# Patient Record
Sex: Female | Born: 1960
Health system: Southern US, Community
[De-identification: ages and names within clinical notes are randomized; demographics above are authoritative.]

## PROBLEM LIST (undated history)

## (undated) DIAGNOSIS — M1990 Unspecified osteoarthritis, unspecified site: Secondary | ICD-10-CM

## (undated) DIAGNOSIS — T7840XA Allergy, unspecified, initial encounter: Secondary | ICD-10-CM

## (undated) DIAGNOSIS — R7303 Prediabetes: Secondary | ICD-10-CM

## (undated) DIAGNOSIS — K449 Diaphragmatic hernia without obstruction or gangrene: Secondary | ICD-10-CM

## (undated) DIAGNOSIS — K219 Gastro-esophageal reflux disease without esophagitis: Secondary | ICD-10-CM

## (undated) DIAGNOSIS — Z889 Allergy status to unspecified drugs, medicaments and biological substances status: Secondary | ICD-10-CM

## (undated) DIAGNOSIS — I10 Essential (primary) hypertension: Secondary | ICD-10-CM

## (undated) HISTORY — DX: Diaphragmatic hernia without obstruction or gangrene: K44.9

## (undated) HISTORY — DX: Prediabetes: R73.03

## (undated) HISTORY — DX: Allergy, unspecified, initial encounter: T78.40XA

## (undated) HISTORY — DX: Unspecified osteoarthritis, unspecified site: M19.90

## (undated) HISTORY — PX: CHOLECYSTECTOMY: SHX55

## (undated) HISTORY — PX: TUBAL LIGATION: SHX77

## (undated) HISTORY — PX: ANKLE SURGERY: SHX546

## (undated) HISTORY — PX: TONSILLECTOMY: SUR1361

---

## 1999-03-30 ENCOUNTER — Emergency Department (HOSPITAL_COMMUNITY): Admission: EM | Admit: 1999-03-30 | Discharge: 1999-03-30 | Payer: Self-pay

## 2001-02-20 ENCOUNTER — Encounter (INDEPENDENT_AMBULATORY_CARE_PROVIDER_SITE_OTHER): Payer: Self-pay | Admitting: Specialist

## 2001-02-20 ENCOUNTER — Ambulatory Visit (HOSPITAL_COMMUNITY): Admission: RE | Admit: 2001-02-20 | Discharge: 2001-02-20 | Payer: Self-pay | Admitting: Obstetrics & Gynecology

## 2002-02-19 ENCOUNTER — Other Ambulatory Visit: Admission: RE | Admit: 2002-02-19 | Discharge: 2002-02-19 | Payer: Self-pay | Admitting: Obstetrics & Gynecology

## 2002-03-14 ENCOUNTER — Emergency Department (HOSPITAL_COMMUNITY): Admission: EM | Admit: 2002-03-14 | Discharge: 2002-03-14 | Payer: Self-pay

## 2002-03-14 ENCOUNTER — Encounter: Payer: Self-pay | Admitting: Emergency Medicine

## 2002-09-10 ENCOUNTER — Encounter: Payer: Self-pay | Admitting: Internal Medicine

## 2002-09-10 ENCOUNTER — Ambulatory Visit (HOSPITAL_COMMUNITY): Admission: RE | Admit: 2002-09-10 | Discharge: 2002-09-10 | Payer: Self-pay | Admitting: Internal Medicine

## 2002-09-17 ENCOUNTER — Encounter: Payer: Self-pay | Admitting: Internal Medicine

## 2002-09-17 ENCOUNTER — Ambulatory Visit (HOSPITAL_COMMUNITY): Admission: RE | Admit: 2002-09-17 | Discharge: 2002-09-17 | Payer: Self-pay | Admitting: Internal Medicine

## 2003-05-20 ENCOUNTER — Other Ambulatory Visit: Admission: RE | Admit: 2003-05-20 | Discharge: 2003-05-20 | Payer: Self-pay | Admitting: Obstetrics and Gynecology

## 2004-11-11 ENCOUNTER — Encounter: Admission: RE | Admit: 2004-11-11 | Discharge: 2004-11-11 | Payer: Self-pay | Admitting: Orthopedic Surgery

## 2011-02-01 NOTE — Op Note (Signed)
[facility]  Patient:    Felicia Chase, Felicia Chase                  MRN: [id number] Proc. Date: 02/20/01 Adm. Date:  [id number] Attending:  Genia Del                           Operative Report  DATE OF BIRTH:                May 25, 1961.  PREOPERATIVE DIAGNOSIS:       An 8+ weeks gestation, undesired.  POSTOPERATIVE DIAGNOSIS:      An 8+ weeks gestation, undesired.  OPERATION:                    Dilatation and evacuation.  SURGEON:                      Genia Del, M.D.  ANESTHESIOLOGIST:             Ellison Hughs., M.D.  ANESTHESIA:                   MAC.  ESTIMATED BLOOD LOSS:         Less than 50 cc.  DESCRIPTION OF PROCEDURE:     Under MAC analgesia, the patient is in lithotomy position. She is prepped with Betadine in the suprapubic, vulva, and vaginal areas. Catheterization of the bladder is achieved. We then drape the patient as usual. The vaginal exam revealed a retroverted uterus corresponding to 8 to [redacted] weeks gestation, no adnexal mass. The cervix is closed, no vaginal bleeding. The speculum is introduced. The paracervical block is done with lidocaine 1% 20 cc total at 4 and 8 oclock. We then grasp the anterior lip of the cervix with the tenaculum and proceed with dilatation with Hegar dilators up to #35 easily. We then introduced a #9 curved suction curet and the intrauterine cavity is suctioned systematically. Products of conception correspond to about 8 to [redacted] weeks gestation and they are sent to pathology. We then complete the curettage with a sharp curet, curetting all surfaces of the intrauterine cavity. The intrauterine sound is heard throughout. We then finish by suctioning the intrauterine cavity with a #9 curet. The uterus contracts well. All instruments are removed. Hemostasis was adequate. Estimated blood loss was less than 50 cc. The vaginal exam after the intervention was normal with a well contracted  uterus. Note that the patient was coughing during the end of the surgery. This problem was managed by anesthesiology. The patient remained stable and well saturated throughout. No complication. The patient was transferred to recovery room in good status. Pending blood group; RhoGAM will be given if Rh is negative.DD:  02/20/01 TD:  02/20/01 Job: [id number]

## 2011-10-29 ENCOUNTER — Encounter (HOSPITAL_BASED_OUTPATIENT_CLINIC_OR_DEPARTMENT_OTHER): Payer: Self-pay | Admitting: *Deleted

## 2011-10-29 ENCOUNTER — Emergency Department (INDEPENDENT_AMBULATORY_CARE_PROVIDER_SITE_OTHER): Payer: BC Managed Care – PPO

## 2011-10-29 ENCOUNTER — Emergency Department (HOSPITAL_BASED_OUTPATIENT_CLINIC_OR_DEPARTMENT_OTHER)
Admission: EM | Admit: 2011-10-29 | Discharge: 2011-10-29 | Disposition: A | Discharge status: Home / Self Care | Payer: BC Managed Care – PPO | Attending: Emergency Medicine | Admitting: Emergency Medicine

## 2011-10-29 ENCOUNTER — Other Ambulatory Visit: Payer: Self-pay

## 2011-10-29 DIAGNOSIS — R0602 Shortness of breath: Secondary | ICD-10-CM

## 2011-10-29 DIAGNOSIS — M79609 Pain in unspecified limb: Secondary | ICD-10-CM

## 2011-10-29 DIAGNOSIS — R079 Chest pain, unspecified: Secondary | ICD-10-CM

## 2011-10-29 DIAGNOSIS — K219 Gastro-esophageal reflux disease without esophagitis: Secondary | ICD-10-CM | POA: Insufficient documentation

## 2011-10-29 DIAGNOSIS — R209 Unspecified disturbances of skin sensation: Secondary | ICD-10-CM

## 2011-10-29 HISTORY — DX: Gastro-esophageal reflux disease without esophagitis: K21.9

## 2011-10-29 LAB — CBC
HCT: 32.7 % — ABNORMAL LOW (ref 36.0–46.0)
Hemoglobin: 10.9 g/dL — ABNORMAL LOW (ref 12.0–15.0)
MCH: 26.7 pg (ref 26.0–34.0)
MCHC: 33.3 g/dL (ref 30.0–36.0)
MCV: 80 fL (ref 78.0–100.0)
Platelets: 224 10*3/uL (ref 150–400)
RBC: 4.09 MIL/uL (ref 3.87–5.11)
RDW: 14.8 % (ref 11.5–15.5)
WBC: 9.9 10*3/uL (ref 4.0–10.5)

## 2011-10-29 LAB — COMPREHENSIVE METABOLIC PANEL
ALT: 11 U/L (ref 0–35)
AST: 11 U/L (ref 0–37)
Albumin: 4 g/dL (ref 3.5–5.2)
Alkaline Phosphatase: 77 U/L (ref 39–117)
BUN: 21 mg/dL (ref 6–23)
CO2: 25 mEq/L (ref 19–32)
Calcium: 9.6 mg/dL (ref 8.4–10.5)
Chloride: 105 mEq/L (ref 96–112)
Creatinine, Ser: 0.9 mg/dL (ref 0.50–1.10)
GFR calc Af Amer: 85 mL/min — ABNORMAL LOW (ref >90)
GFR calc non Af Amer: 73 mL/min — ABNORMAL LOW (ref >90)
Glucose, Bld: 95 mg/dL (ref 70–99)
Potassium: 3.5 mEq/L (ref 3.5–5.1)
Sodium: 140 mEq/L (ref 135–145)
Total Bilirubin: 0.2 mg/dL — ABNORMAL LOW (ref 0.3–1.2)
Total Protein: 7.3 g/dL (ref 6.0–8.3)

## 2011-10-29 LAB — CARDIAC PANEL(CRET KIN+CKTOT+MB+TROPI)
CK, MB: 2.2 ng/mL (ref 0.3–4.0)
Relative Index: INVALID (ref 0.0–2.5)
Total CK: 94 U/L (ref 7–177)
Troponin I: <0.3 ng/mL (ref <0.30)

## 2011-10-29 MED ORDER — OXYCODONE-ACETAMINOPHEN 5-325 MG PO TABS: 1.0000 | ORAL_TABLET | Freq: Four times a day (QID) | ORAL | Status: AC | PRN

## 2011-10-29 MED ORDER — IBUPROFEN 800 MG PO TABS
800.0000 mg | ORAL_TABLET | Freq: Once | ORAL | Status: AC
  Administered 2011-10-29: 800 mg via ORAL
  Filled 2011-10-29: qty 1

## 2011-10-29 MED ORDER — ALBUTEROL SULFATE HFA 108 (90 BASE) MCG/ACT IN AERS
2.0000 | INHALATION_SPRAY | RESPIRATORY_TRACT | Status: DC
  Administered 2011-10-29: 2 via RESPIRATORY_TRACT
  Filled 2011-10-29: qty 6.7

## 2011-10-29 MED ORDER — IBUPROFEN 600 MG PO TABS: 600.0000 mg | ORAL_TABLET | Freq: Three times a day (TID) | ORAL | Status: AC | PRN

## 2011-10-29 NOTE — ED Provider Notes (Signed)
History     CSN: [id number]  Arrival date & time 10/29/11  2126   First MD Initiated Contact with Patient 10/29/11 2300      Chief Complaint  Patient presents with  . Chest Pain    (Consider location/radiation/quality/duration/timing/severity/associated sxs/prior treatment) The history is provided by the patient.   the patient reports 4 days of constant left-sided chest and trapezius pain.  She reports tingling in her left upper extremity.  She denies neck pain.  She's had no recent fall or trauma to her head her neck.  She's had no recent trauma to her chest.  She reports the pain in the left chest is constant.  It is worsened by movement of her left arm and by palpation of her left chest.  She has not noted a rash.  She denies fevers and chills.  She denies diaphoresis.  She denies shortness of breath or cough.  She denies unilateral leg swelling.  She denies weakness of her upper extremities.  Nothing worsens her symptoms.  Nothing improves her symptoms.  She has tried some anti-inflammatories without relief.  She reports more difficulty sleeping at night secondary to the pain.  She has no early family history of heart disease.  She was recently started on meloxicam for chronic lower extreme pain and reports that she stop this and she believes this might be causing her symptoms.  Past Medical History  Diagnosis Date  . GERD (gastroesophageal reflux disease)     Past Surgical History  Procedure Date  . Cholecystectomy   . Tonsillectomy   . Tubal ligation     History reviewed. No pertinent family history.  History  Substance Use Topics  . Smoking status: Never Smoker   . Smokeless tobacco: Not on file  . Alcohol Use: No    OB History    Grav Para Term Preterm Abortions TAB SAB Ect Mult Living                  Review of Systems  All other systems reviewed and are negative.    Allergies  Penicillins; Cefdinir; and Vicodin  Home Medications   Current Outpatient Rx   Name Route Sig Dispense Refill  . VITAMIN D 1000 UNITS PO TABS Oral Take 1,000 Units by mouth daily.    . MELOXICAM 15 MG PO TABS Oral Take 15 mg by mouth daily.    Marland Kitchen OMEPRAZOLE 20 MG PO CPDR Oral Take 40 mg by mouth daily.    Marland Kitchen VITAMIN E 400 UNITS PO CAPS Oral Take 400 Units by mouth daily.      BP 165/93  Pulse 86  Temp(Src) 98.5 F (36.9 C) (Oral)  Resp 18  Ht 5\' 7"  (1.702 m)  Wt 218 lb (98.884 kg)  BMI 34.14 kg/m2  SpO2 100%  Physical Exam  Nursing note and vitals reviewed. Constitutional: She is oriented to person, place, and time. She appears well-developed and well-nourished. No distress.  HENT:  Head: Normocephalic and atraumatic.  Eyes: EOM are normal.  Neck: Normal range of motion.  Cardiovascular: Normal rate, regular rhythm and normal heart sounds.   Pulmonary/Chest: Effort normal and breath sounds normal.       Tenderness of her left chest wall and trapezius.  No notable rash noted  Abdominal: Soft. She exhibits no distension. There is no tenderness.  Musculoskeletal: Normal range of motion.       Normal strength in her bilateral upper extremities.  Normal radial pulses bilaterally.  No obvious  swelling of her left upper extremity.  She does have pain that is caused in her left chest with movement of her left upper extremity  Neurological: She is alert and oriented to person, place, and time.  Skin: Skin is warm and dry.  Psychiatric: She has a normal mood and affect. Judgment normal.    ED Course  Procedures (including critical care time)   Date: 10/29/2011  Rate: 86  Rhythm: normal sinus rhythm  QRS Axis: normal  Intervals: normal  ST/T Wave abnormalities: normal  Conduction Disutrbances: none  Narrative Interpretation:   Old EKG Reviewed: No prior ecg    Labs Reviewed  CBC - Abnormal; Notable for the following:    Hemoglobin 10.9 (*)    HCT 32.7 (*)    All other components within normal limits  COMPREHENSIVE METABOLIC PANEL - Abnormal; Notable  for the following:    Total Bilirubin 0.2 (*)    GFR calc non Af Amer 73 (*)    GFR calc Af Amer 85 (*)    All other components within normal limits  CARDIAC PANEL(CRET KIN+CKTOT+MB+TROPI)   Dg Chest 2 View  10/29/2011  *RADIOLOGY REPORT*  Clinical Data: Left-sided chest pain radiating to the left upper extremity.  Numbness and tingling.  Shortness of breath.  CHEST - 2 VIEW  Comparison: None.  Findings: Normal heart size and pulmonary vascularity.  No focal airspace consolidation in the lungs.  No blunting of costophrenic angles.  No pneumothorax.  Surgical clips in the right upper quadrant.  Mild degenerative changes in the spine.  IMPRESSION: No evidence of active pulmonary disease.  Original Report Authenticated By: Marlon Pel, M.D.   I personally reviewed the x-ray  1. Chest pain       MDM  Patient's symptoms may represent cervical radiculopathy.  Her pain has been constant.  Her EKG and chest x-ray and troponin are normal.  She's had 4 days of constant pain.  She has normal strength.  Symptomatically min.  PCP followup if her symptoms continue she may require an MRI of her cervical spine.  Doubt ACS.  Labs otherwise normal.        Lyanne Co, MD 10/30/11 [phone]

## 2011-10-29 NOTE — ED Notes (Signed)
Pt returned from XR. Monitor reapplied.

## 2011-10-29 NOTE — ED Notes (Signed)
Pt being transported to XR at this time.  

## 2011-10-29 NOTE — ED Notes (Signed)
Pt c/o Cp pressure x 4 days ago, recently started on meloxicam on Friday.

## 2011-10-29 NOTE — ED Notes (Signed)
Pt reports chest pressure that radiates into the left arm x4 days. Intermittent. Reports mild cough. Denies fevers. Pt thought the meloxicam may have been causing the chest pains, so she stopped taking it Sunday, and has not taken it since. CP continue.

## 2013-10-10 ENCOUNTER — Emergency Department (HOSPITAL_COMMUNITY)
Admission: EM | Admit: 2013-10-10 | Discharge: 2013-10-10 | Disposition: A | Discharge status: Home / Self Care | Payer: BC Managed Care – PPO | Attending: Emergency Medicine | Admitting: Emergency Medicine

## 2013-10-10 ENCOUNTER — Encounter (HOSPITAL_COMMUNITY): Payer: Self-pay | Admitting: Emergency Medicine

## 2013-10-10 ENCOUNTER — Emergency Department (HOSPITAL_COMMUNITY): Payer: BC Managed Care – PPO

## 2013-10-10 DIAGNOSIS — Z791 Long term (current) use of non-steroidal anti-inflammatories (NSAID): Secondary | ICD-10-CM | POA: Insufficient documentation

## 2013-10-10 DIAGNOSIS — K219 Gastro-esophageal reflux disease without esophagitis: Secondary | ICD-10-CM | POA: Insufficient documentation

## 2013-10-10 DIAGNOSIS — Z880 Allergy status to penicillin: Secondary | ICD-10-CM | POA: Insufficient documentation

## 2013-10-10 DIAGNOSIS — Z79899 Other long term (current) drug therapy: Secondary | ICD-10-CM | POA: Insufficient documentation

## 2013-10-10 DIAGNOSIS — R209 Unspecified disturbances of skin sensation: Secondary | ICD-10-CM | POA: Insufficient documentation

## 2013-10-10 DIAGNOSIS — M25519 Pain in unspecified shoulder: Secondary | ICD-10-CM | POA: Insufficient documentation

## 2013-10-10 DIAGNOSIS — M62838 Other muscle spasm: Secondary | ICD-10-CM

## 2013-10-10 DIAGNOSIS — IMO0002 Reserved for concepts with insufficient information to code with codable children: Secondary | ICD-10-CM | POA: Insufficient documentation

## 2013-10-10 DIAGNOSIS — M5380 Other specified dorsopathies, site unspecified: Secondary | ICD-10-CM | POA: Insufficient documentation

## 2013-10-10 LAB — BASIC METABOLIC PANEL
BUN: 10 mg/dL (ref 6–23)
CO2: 24 mEq/L (ref 19–32)
Calcium: 8.9 mg/dL (ref 8.4–10.5)
Chloride: 103 mEq/L (ref 96–112)
Creatinine, Ser: 0.77 mg/dL (ref 0.50–1.10)
GFR calc Af Amer: >90 mL/min (ref >90)
GFR calc non Af Amer: >90 mL/min (ref >90)
Glucose, Bld: 111 mg/dL — ABNORMAL HIGH (ref 70–99)
Potassium: 3.6 mEq/L — ABNORMAL LOW (ref 3.7–5.3)
Sodium: 142 mEq/L (ref 137–147)

## 2013-10-10 LAB — CBC
HCT: 34.9 % — ABNORMAL LOW (ref 36.0–46.0)
Hemoglobin: 11.4 g/dL — ABNORMAL LOW (ref 12.0–15.0)
MCH: 27.2 pg (ref 26.0–34.0)
MCHC: 32.7 g/dL (ref 30.0–36.0)
MCV: 83.3 fL (ref 78.0–100.0)
Platelets: 247 10*3/uL (ref 150–400)
RBC: 4.19 MIL/uL (ref 3.87–5.11)
RDW: 14.6 % (ref 11.5–15.5)
WBC: 7.5 10*3/uL (ref 4.0–10.5)

## 2013-10-10 LAB — POCT I-STAT TROPONIN I: Troponin i, poc: 0 ng/mL (ref 0.00–0.08)

## 2013-10-10 MED ORDER — DIAZEPAM 5 MG PO TABS
10.0000 mg | ORAL_TABLET | Freq: Once | ORAL | Status: AC
  Administered 2013-10-10: 10 mg via ORAL
  Filled 2013-10-10: qty 2

## 2013-10-10 MED ORDER — DIAZEPAM 5 MG PO TABS: 5.0000 mg | ORAL_TABLET | Freq: Two times a day (BID) | ORAL | Status: DC | PRN

## 2013-10-10 NOTE — ED Provider Notes (Signed)
CSN: [id number]     Arrival date & time 10/10/13  1158 History   First MD Initiated Contact with Patient 10/10/13 1213     Chief Complaint  Patient presents with  . Chest Pain  . Shoulder Pain   (Consider location/radiation/quality/duration/timing/severity/associated sxs/prior Treatment) HPI Comments: History of L arm numbness, muscle spasms. Has had MRI, was told she had C5-C6 DDD causing her L arm problems. Patient was doing PT for this up until 2 months ago.   Patient is a 53 y.o. female presenting with chest pain and shoulder pain.  Chest Pain Pain location:  L lateral chest Pain quality: tightness (like muscle spasm)   Pain radiates to:  Does not radiate Pain radiates to the back: no   Pain severity:  Mild Onset quality:  Gradual Duration:  4 days Timing:  Constant Progression:  Unchanged Chronicity:  New Context: no drug use   Relieved by:  Nothing Worsened by:  Nothing tried Associated symptoms: no cough, no fever and no shortness of breath   Shoulder Pain Pertinent negatives include no chest pain and no shortness of breath.    Past Medical History  Diagnosis Date  . GERD (gastroesophageal reflux disease)    Past Surgical History  Procedure Laterality Date  . Cholecystectomy    . Tonsillectomy    . Tubal ligation     History reviewed. No pertinent family history. History  Substance Use Topics  . Smoking status: Never Smoker   . Smokeless tobacco: Not on file  . Alcohol Use: No   OB History   Grav Para Term Preterm Abortions TAB SAB Ect Mult Living                 Review of Systems  Constitutional: Negative for fever and chills.  Respiratory: Negative for cough and shortness of breath.   Cardiovascular: Negative for chest pain and leg swelling.  All other systems reviewed and are negative.    Allergies  Penicillins; Cefdinir; and Vicodin  Home Medications   Current Outpatient Rx  Name  Route  Sig  Dispense  Refill  . cholecalciferol (VITAMIN  D) 1000 UNITS tablet   Oral   Take 1,000 Units by mouth daily.         . meloxicam (MOBIC) 15 MG tablet   Oral   Take 15 mg by mouth daily.         Marland Kitchen omeprazole (PRILOSEC) 20 MG capsule   Oral   Take 40 mg by mouth daily.         . vitamin E 400 UNIT capsule   Oral   Take 400 Units by mouth daily.          BP 140/94  Pulse 87  Temp(Src) 98.4 F (36.9 C) (Oral)  Resp 18  Ht 5\' 7"  (1.702 m)  Wt 226 lb 14.4 oz (102.921 kg)  BMI 35.53 kg/m2  SpO2 98% Physical Exam  Nursing note and vitals reviewed. Constitutional: She is oriented to person, place, and time. She appears well-developed and well-nourished. No distress.  HENT:  Head: Normocephalic and atraumatic.  Eyes: EOM are normal. Pupils are equal, round, and reactive to light.  Neck: Normal range of motion. Neck supple.  Cardiovascular: Normal rate and regular rhythm.  Exam reveals no friction rub.   No murmur heard. Pulmonary/Chest: Effort normal. No respiratory distress. She has no decreased breath sounds. She has no wheezes. She has no rales. She exhibits tenderness (left upper chest).  Abdominal: Soft. She  exhibits no distension. There is no tenderness. There is no rebound.  Musculoskeletal: Normal range of motion. She exhibits no edema.       Cervical back: She exhibits tenderness (left trapezius) and spasm (L trapezius, severe).  Neurological: She is alert and oriented to person, place, and time.  Skin: She is not diaphoretic.    ED Course  Procedures (including critical care time) Labs Review Labs Reviewed  CBC - Abnormal; Notable for the following:    Hemoglobin 11.4 (*)    HCT 34.9 (*)    All other components within normal limits  BASIC METABOLIC PANEL - Abnormal; Notable for the following:    Potassium 3.6 (*)    Glucose, Bld 111 (*)    All other components within normal limits  POCT I-STAT TROPONIN I   Imaging Review Dg Chest Portable 1 View  10/10/2013   CLINICAL DATA:  Chest and shoulder  pain.  EXAM: PORTABLE CHEST - 1 VIEW  COMPARISON:  10/29/2011  FINDINGS: Lungs are clear. Visualized stable cardiomegaly. Remaining bones soft tissues are within normal.  IMPRESSION: No active disease.   Electronically Signed   By: Marin Olp M.D.   On: 10/10/2013 14:20    EKG Interpretation    Date/Time:  Sunday October 10 2013 12:02:41 EST Ventricular Rate:  101 PR Interval:  152 QRS Duration: 74 QT Interval:  366 QTC Calculation: 474 R Axis:   52 Text Interpretation:  Sinus tachycardia Otherwise normal ECG Similar to prior Confirmed by Mingo Amber  MD, [place] ([id number]) on 10/10/2013 12:46:00 PM            MDM   1. Muscle spasm    53 year old female presents with left-sided chest tightness and left arm numbness. Left arm numbness has been going on for over a year. She was told she had a degenerative disc disease has been going to physical therapy for her neck to help with this. She hasn't been to physical therapy in a couple months. She stated now she's having some chest tightness and occasional fluttering. She can't determine if the fluttering is a palpitations or like muscle twitching. She denies any chest pain, N/V, SOB. Here AFVSS. Lungs clear. Upper L chest soreness on palpation. L trapezius with severe muscle spasm. Left trapezius muscle spasms likely the source of her arm numbness and chest tightness. We'll check a troponin to ensure no concern for cardiac disease because check EKG and chest x-ray, however will also give Valium for muscle spasm. Single troponin negative. CXR clear. Feeling better after valium. Discharged with valium Rx.     Osvaldo Shipper, MD 10/10/13 [phone]

## 2013-10-10 NOTE — ED Notes (Signed)
PA at bedside.

## 2013-10-10 NOTE — Discharge Instructions (Signed)
°  Muscle Cramps and Spasms °Muscle cramps and spasms occur when a muscle or muscles tighten and you have no control over this tightening (involuntary muscle contraction). They are a common problem and can develop in any muscle. The most common place is in the calf muscles of the leg. Both muscle cramps and muscle spasms are involuntary muscle contractions, but they also have differences:  °· Muscle cramps are sporadic and painful. They may last a few seconds to a quarter of an hour. Muscle cramps are often more forceful and last longer than muscle spasms. °· Muscle spasms may or may not be painful. They may also last just a few seconds or much longer. °CAUSES  °It is uncommon for cramps or spasms to be due to a serious underlying problem. In many cases, the cause of cramps or spasms is unknown. Some common causes are:  °· Overexertion.   °· Overuse from repetitive motions (doing the same thing over and over).   °· Remaining in a certain position for a long period of time.   °· Improper preparation, form, or technique while performing a sport or activity.   °· Dehydration.   °· Injury.   °· Side effects of some medicines.   °· Abnormally low levels of the salts and ions in your blood (electrolytes), especially potassium and calcium. This could happen if you are taking water pills (diuretics) or you are pregnant.   °Some underlying medical problems can make it more likely to develop cramps or spasms. These include, but are not limited to:  °· Diabetes.   °· Parkinson disease.   °· Hormone disorders, such as thyroid problems.   °· Alcohol abuse.   °· Diseases specific to muscles, joints, and bones.   °· Blood vessel disease where not enough blood is getting to the muscles.   °HOME CARE INSTRUCTIONS  °· Stay well hydrated. Drink enough water and fluids to keep your urine clear or pale yellow. °· It may be helpful to massage, stretch, and relax the affected muscle. °· For tight or tense muscles, use a warm towel, heating  pad, or hot shower water directed to the affected area. °· If you are sore or have pain after a cramp or spasm, applying ice to the affected area may relieve discomfort. °· Put ice in a plastic bag. °· Place a towel between your skin and the bag. °· Leave the ice on for 15-20 minutes, 03-04 times a day. °· Medicines used to treat a known cause of cramps or spasms may help reduce their frequency or severity. Only take over-the-counter or prescription medicines as directed by your caregiver. °SEEK MEDICAL CARE IF:  °Your cramps or spasms get more severe, more frequent, or do not improve over time.  °MAKE SURE YOU:  °· Understand these instructions. °· Will watch your condition. °· Will get help right away if you are not doing well or get worse. °Document Released: 02/22/2002 Document Revised: 12/28/2012 Document Reviewed: 08/19/2012 °[employer]® Patient Information ©2014 [employer]. ° ° °

## 2013-10-10 NOTE — ED Notes (Signed)
Pt here from home with c/o left shoulder pain that radiates to her chest , pt does have a c5 and c6 problem that has affected her shoulder for about a year

## 2015-12-13 ENCOUNTER — Encounter: Payer: Self-pay | Admitting: Obstetrics & Gynecology

## 2015-12-13 ENCOUNTER — Ambulatory Visit (INDEPENDENT_AMBULATORY_CARE_PROVIDER_SITE_OTHER): Payer: Self-pay | Admitting: Obstetrics & Gynecology

## 2015-12-13 VITALS — BP 155/88 | HR 82 | Temp 98.1°F | Ht 67.0 in | Wt 225.4 lb

## 2015-12-13 DIAGNOSIS — N898 Other specified noninflammatory disorders of vagina: Secondary | ICD-10-CM

## 2015-12-13 DIAGNOSIS — Z113 Encounter for screening for infections with a predominantly sexual mode of transmission: Secondary | ICD-10-CM

## 2015-12-13 NOTE — Progress Notes (Signed)
   CLINIC ENCOUNTER NOTE  History:  55 y.o. F here today for white vaginal discharge and irritation for 2 months.   She denies any abnormal vaginal bleeding, pelvic pain or other concerns.   Past Medical History  Diagnosis Date  . GERD (gastroesophageal reflux disease)     Past Surgical History  Procedure Laterality Date  . Cholecystectomy    . Tonsillectomy    . Tubal ligation      The following portions of the patient's history were reviewed and updated as appropriate: allergies, current medications, past family history, past medical history, past social history, past surgical history and problem list.   Health Maintenance:  Normal pap and negative HRHPV on 04/2014.  Normal mammogram on 12/2014. Marland Kitchen   Review of Systems:  Pertinent items noted in HPI and remainder of comprehensive ROS otherwise negative.  Objective:  Physical Exam BP 155/88 mmHg  Pulse 82  Temp(Src) 98.1 F (36.7 C) (Oral)  Ht 5\' 7"  (1.702 m)  Wt 225 lb 6.2 oz (102.236 kg)  BMI 35.29 kg/m2 CONSTITUTIONAL: Well-developed, well-nourished female in no acute distress.  HENT:  Normocephalic, atraumatic. External right and left ear normal. Oropharynx is clear and moist EYES: Conjunctivae and EOM are normal. Pupils are equal, round, and reactive to light. No scleral icterus.  NECK: Normal range of motion, supple, no masses SKIN: Skin is warm and dry. No rash noted. Not diaphoretic. No erythema. No pallor. NEUROLOGIC: Alert and oriented to person, place, and time. Normal reflexes, muscle tone coordination. No cranial nerve deficit noted. PSYCHIATRIC: Normal mood and affect. Normal behavior. Normal judgment and thought content. CARDIOVASCULAR: Normal heart rate noted RESPIRATORY: Effort and breath sounds normal, no problems with respiration noted ABDOMEN: Soft, no distention noted.   PELVIC: Normal appearing external genitalia; normal appearing vaginal mucosa and cervix.  Scant white discharge noted, wet prep  obtained.  Normal uterine size, no other palpable masses, no uterine or adnexal tenderness. MUSCULOSKELETAL: Normal range of motion. No edema noted.   Assessment & Plan:   Vaginal discharge Cervicovaginal ancillary done, will follow up results and manage accordingly. Proper vulvar hygiene emphasized: discussed avoidance of perfumed soaps, detergents, lotions and any type of douches; in addition to wearing cotton underwear and no underwear at night.  Also recommended cleaning front to back, voiding and cleaning up after intercourse.    Routine preventative health maintenance measures emphasized. Please refer to After Visit Summary for other counseling recommendations.    Total face-to-face time with patient: 20 minutes. Over 50% of encounter was spent on counseling and coordination of care.   Verita Schneiders, MD, [place] Attending Obstetrician & Gynecologist, [place] for [facility]

## 2015-12-14 LAB — CERVICOVAGINAL ANCILLARY ONLY: Wet Prep (BD Affirm): NEGATIVE

## 2015-12-15 ENCOUNTER — Telehealth: Payer: Self-pay | Admitting: Obstetrics & Gynecology

## 2015-12-15 NOTE — Telephone Encounter (Signed)
Patient calling to get test results.

## 2015-12-18 ENCOUNTER — Telehealth: Payer: Self-pay | Admitting: General Practice

## 2015-12-18 NOTE — Telephone Encounter (Signed)
Per Dr Harolyn Rutherford, patient's wet prep was negative. Called patient and informed her of results. Patient verbalized understanding & had no questions.

## 2015-12-18 NOTE — Telephone Encounter (Signed)
Opened in error

## 2015-12-20 ENCOUNTER — Other Ambulatory Visit: Payer: Self-pay | Admitting: Nurse Practitioner

## 2015-12-20 DIAGNOSIS — Z1231 Encounter for screening mammogram for malignant neoplasm of breast: Secondary | ICD-10-CM

## 2016-01-10 ENCOUNTER — Ambulatory Visit
Admission: RE | Admit: 2016-01-10 | Discharge: 2016-01-10 | Disposition: A | Discharge status: Home / Self Care | Payer: No Typology Code available for payment source | Source: Ambulatory Visit | Attending: Nurse Practitioner | Admitting: Nurse Practitioner

## 2016-01-10 DIAGNOSIS — Z1231 Encounter for screening mammogram for malignant neoplasm of breast: Secondary | ICD-10-CM

## 2016-08-14 ENCOUNTER — Ambulatory Visit (INDEPENDENT_AMBULATORY_CARE_PROVIDER_SITE_OTHER): Payer: Self-pay | Admitting: *Deleted

## 2016-08-14 DIAGNOSIS — N898 Other specified noninflammatory disorders of vagina: Secondary | ICD-10-CM

## 2016-08-14 NOTE — Progress Notes (Signed)
Pt reports having vaginal discharge and itch.  Self wet prep obtained.  Pt will be called with results tomorrow.  Pt voiced understanding.

## 2016-08-15 ENCOUNTER — Other Ambulatory Visit: Payer: Self-pay

## 2016-08-15 LAB — WET PREP, GENITAL
Trich, Wet Prep: NONE SEEN
Yeast Wet Prep HPF POC: NONE SEEN

## 2016-08-15 MED ORDER — METRONIDAZOLE 500 MG PO TABS: 500.0000 mg | ORAL_TABLET | Freq: Two times a day (BID) | ORAL | 0 refills | Status: DC

## 2016-08-15 NOTE — Telephone Encounter (Signed)
Patient has been informed of her BV and need for Flagyl. Patient also requested to something be called into the pharmacy. Per Dr.Pratt patient should start flagyl and give in a couple of days.

## 2016-08-21 ENCOUNTER — Telehealth: Payer: Self-pay

## 2016-08-21 MED ORDER — FLUCONAZOLE 150 MG PO TABS: 150.0000 mg | ORAL_TABLET | Freq: Once | ORAL | 0 refills | Status: AC

## 2016-08-21 NOTE — Telephone Encounter (Signed)
Pt called and requested an Rx for diflucan because after she finishes the course of Flagyl she usually gets a yeast infection.  Per standing orders, Diflucan x 1 dose ordered.  Notified pt Rx requested has been sent to her pharmacy.

## 2016-09-06 ENCOUNTER — Telehealth: Payer: Self-pay | Admitting: General Practice

## 2016-09-06 NOTE — Telephone Encounter (Signed)
Called patient, no answer- left message stating we are trying to reach you to return your phone call, please call us back 

## 2016-09-06 NOTE — Telephone Encounter (Signed)
Patient called and left message stating she was treated on November 29 or 30 for BV and was given diflucan. Patient states she is still having an odor/itching.

## 2016-09-10 NOTE — Telephone Encounter (Signed)
Patient called stating that Nurse Morey Hummingbird called her and she missed the call and would like for her to call her back. Pt called on Friday 12/22 @1523 .

## 2016-09-11 MED ORDER — FLUCONAZOLE 150 MG PO TABS: 150.0000 mg | ORAL_TABLET | Freq: Once | ORAL | 2 refills | Status: AC

## 2016-09-11 MED ORDER — METRONIDAZOLE 500 MG PO TABS: 500.0000 mg | ORAL_TABLET | Freq: Two times a day (BID) | ORAL | 2 refills | Status: DC

## 2016-09-11 NOTE — Telephone Encounter (Signed)
Called pt and discussed her concerns.  She states that even though she had good results initially from metronidazole 1 month ago, her sx of BV have returned - particularly the odor. She is requesting to be retreated and also Rx for diflucan. Orders received from Dr. Ilda Basset and pt was notified.

## 2017-01-14 ENCOUNTER — Other Ambulatory Visit: Payer: Self-pay | Admitting: Obstetrics and Gynecology

## 2017-01-14 DIAGNOSIS — Z1231 Encounter for screening mammogram for malignant neoplasm of breast: Secondary | ICD-10-CM

## 2017-02-04 ENCOUNTER — Ambulatory Visit (HOSPITAL_COMMUNITY)
Admission: RE | Admit: 2017-02-04 | Discharge: 2017-02-04 | Disposition: A | Discharge status: Home / Self Care | Payer: Self-pay | Source: Ambulatory Visit | Attending: Obstetrics and Gynecology | Admitting: Obstetrics and Gynecology

## 2017-02-04 ENCOUNTER — Encounter (HOSPITAL_COMMUNITY): Payer: Self-pay

## 2017-02-04 ENCOUNTER — Ambulatory Visit
Admission: RE | Admit: 2017-02-04 | Discharge: 2017-02-04 | Disposition: A | Discharge status: Home / Self Care | Payer: No Typology Code available for payment source | Source: Ambulatory Visit | Attending: Obstetrics and Gynecology | Admitting: Obstetrics and Gynecology

## 2017-02-04 VITALS — BP 150/94 | Temp 98.4°F | Ht 67.5 in | Wt 235.8 lb

## 2017-02-04 DIAGNOSIS — Z1231 Encounter for screening mammogram for malignant neoplasm of breast: Secondary | ICD-10-CM

## 2017-02-04 DIAGNOSIS — Z1239 Encounter for other screening for malignant neoplasm of breast: Secondary | ICD-10-CM

## 2017-02-04 HISTORY — DX: Essential (primary) hypertension: I10

## 2017-02-04 HISTORY — DX: Allergy status to unspecified drugs, medicaments and biological substances: Z88.9

## 2017-02-04 NOTE — Patient Instructions (Signed)
Explained breast self awareness with [name]. Patient did not need a Pap smear today due to last Pap smear was 11/10/2016. Let her know [facility] will cover Pap smears every 3 years unless has a history of abnormal Pap smears. Referred patient to the [facility] for a screening mammogram. Appointment scheduled for Tuesday, Feb 04, 2017 at 1410. Let [facility] know the [facility] will follow up with her within the next couple weeks with results of mammogram by letter or phone. Jacqualine Mau LYNNELL FIUMARA verbalized understanding.  Bessie Livingood, Arvil Chaco, RN 1:21 PM

## 2017-02-04 NOTE — Progress Notes (Signed)
No complaints today.   Pap Smear: Pap smear not completed today. Last Pap smear was 11/10/2016 at the [facility] and normal. Per patient has no history of an abnormal Pap smear. Last Pap smear result is in EPIC.  Physical exam: Breasts Breasts symmetrical. No skin abnormalities bilateral breasts. No nipple retraction bilateral breasts. No nipple discharge bilateral breasts. No lymphadenopathy. No lumps palpated bilateral breasts. No complaints of pain or tenderness on exam. Referred patient to the [facility] for a screening mammogram. Appointment scheduled for Tuesday, Feb 04, 2017 at 1410.        Pelvic/Bimanual No Pap smear completed today since last Pap smear was 11/10/2016. Pap smear not indicated per [facility] guidelines.   Smoking History: Patient has never smoked.  Patient Navigation: Patient education provided. Access to services provided for patient through [facility] program.   Colorectal Cancer Screening: Per patient had a colonoscopy completed 5 years ago. No complaints today. FIT Test given to patient to complete and return to [facility].

## 2017-02-11 ENCOUNTER — Encounter (HOSPITAL_COMMUNITY): Payer: Self-pay | Admitting: *Deleted

## 2017-05-20 ENCOUNTER — Telehealth (HOSPITAL_COMMUNITY): Payer: Self-pay

## 2017-05-20 NOTE — Telephone Encounter (Signed)
Left message with patient to remind about at home FIT Test that was given to the patient in [facility] on 02/04/2017. I let the patient know that if she had any questions that she could call me back.

## 2017-07-28 ENCOUNTER — Telehealth: Payer: Self-pay

## 2017-07-28 NOTE — Telephone Encounter (Signed)
Pre visit call completed. See Specialty comments under snapshot

## 2017-07-29 ENCOUNTER — Ambulatory Visit: Payer: 59 | Admitting: Family

## 2017-07-29 ENCOUNTER — Other Ambulatory Visit (HOSPITAL_COMMUNITY)
Admission: RE | Admit: 2017-07-29 | Discharge: 2017-07-29 | Disposition: A | Discharge status: Home / Self Care | Payer: 59 | Source: Ambulatory Visit | Attending: Family | Admitting: Family

## 2017-07-29 ENCOUNTER — Encounter: Payer: Self-pay | Admitting: Family

## 2017-07-29 ENCOUNTER — Telehealth: Payer: Self-pay | Admitting: Family

## 2017-07-29 VITALS — BP 157/85 | HR 83 | Temp 98.0°F | Ht 67.0 in | Wt 240.0 lb

## 2017-07-29 DIAGNOSIS — R350 Frequency of micturition: Secondary | ICD-10-CM | POA: Insufficient documentation

## 2017-07-29 DIAGNOSIS — G8929 Other chronic pain: Secondary | ICD-10-CM | POA: Diagnosis not present

## 2017-07-29 DIAGNOSIS — K219 Gastro-esophageal reflux disease without esophagitis: Secondary | ICD-10-CM

## 2017-07-29 DIAGNOSIS — R030 Elevated blood-pressure reading, without diagnosis of hypertension: Secondary | ICD-10-CM | POA: Diagnosis not present

## 2017-07-29 DIAGNOSIS — N898 Other specified noninflammatory disorders of vagina: Secondary | ICD-10-CM

## 2017-07-29 DIAGNOSIS — M545 Low back pain: Secondary | ICD-10-CM

## 2017-07-29 DIAGNOSIS — I10 Essential (primary) hypertension: Secondary | ICD-10-CM | POA: Diagnosis not present

## 2017-07-29 DIAGNOSIS — R009 Unspecified abnormalities of heart beat: Secondary | ICD-10-CM

## 2017-07-29 DIAGNOSIS — J302 Other seasonal allergic rhinitis: Secondary | ICD-10-CM

## 2017-07-29 LAB — COMPREHENSIVE METABOLIC PANEL
ALT: 13 U/L (ref 0–35)
AST: 14 U/L (ref 0–37)
Albumin: 4.4 g/dL (ref 3.5–5.2)
Alkaline Phosphatase: 75 U/L (ref 39–117)
BUN: 14 mg/dL (ref 6–23)
CO2: 29 mEq/L (ref 19–32)
Calcium: 9.5 mg/dL (ref 8.4–10.5)
Chloride: 105 mEq/L (ref 96–112)
Creatinine, Ser: 0.86 mg/dL (ref 0.40–1.20)
GFR: 87.77 mL/min (ref >60.00)
Glucose, Bld: 89 mg/dL (ref 70–99)
Potassium: 3.9 mEq/L (ref 3.5–5.1)
Sodium: 140 mEq/L (ref 135–145)
Total Bilirubin: 0.3 mg/dL (ref 0.2–1.2)
Total Protein: 7.3 g/dL (ref 6.0–8.3)

## 2017-07-29 NOTE — Progress Notes (Addendum)
Subjective:    Patient ID: Felicia Chase, female    DOB: [birth year], 56 y.o.   MRN: [id number]  HPI   HTN- Reprots that she was diagnosed back in 2016.  Reports that is taking regularly.  Reports that she had her blood pressure checked yesterday 136/80.   BP Readings from Last 3 Encounters:  07/29/17 (!) 157/85  02/04/17 (!) 150/94  12/13/15 (!) 155/88   GERD- maintained on omeprazole 20mg .Reports that this helps her symptoms.   Seasonal allergies- uses claritin 10mg . Uses this year round. This helps her symptoms. Reports that she has some right ear fullness and feels "light headed every day."    She reports vaginal irritation x 2 weeks. Reports pruritis.  Reports that she has had some white discharge.  Usually resolves with diflucan.    Has skin tags on neck that she would like removed.    Right ear fullness  DDD- requests referral to ortho. Has chronic back pain. Has bulging/degen discs. Chronic back pain. Has trouble with standing/walking, "Like a ton of bricks on my lower back."    Sees Dr. Linton Rump for her ankle (had surgery).  Review of Systems  Constitutional: Negative for unexpected weight change.  HENT: Positive for ear pain.   Eyes: Negative for visual disturbance.  Respiratory: Negative for cough.   Gastrointestinal: Positive for constipation. Negative for diarrhea.       Does not eat many fruits/veggies  Genitourinary: Positive for frequency. Negative for dysuria.  Neurological: Negative for headaches.  Hematological:       Denies swollen glands  Psychiatric/Behavioral:       Denies depression   Past Medical History:  Diagnosis Date  . Acid reflux   . Allergy   . Arthritis   . GERD (gastroesophageal reflux disease)   . H/O seasonal allergies   . Hypertension      Social History   Socioeconomic History  . Marital status: Single    Spouse name: Not on file  . Number of children: Not on file  . Years of education: Not on file  . Highest  education level: Not on file  Social Needs  . Financial resource strain: Not on file  . Food insecurity - worry: Not on file  . Food insecurity - inability: Not on file  . Transportation needs - medical: Not on file  . Transportation needs - non-medical: Not on file  Occupational History  . Not on file  Tobacco Use  . Smoking status: Never Smoker  . Smokeless tobacco: Never Used  Substance and Sexual Activity  . Alcohol use: Yes    Comment: occasional wine  . Drug use: No  . Sexual activity: No    Birth control/protection: Surgical  Other Topics Concern  . Not on file  Social History Narrative  . Not on file    Past Surgical History:  Procedure Laterality Date  . ANKLE SURGERY    . CHOLECYSTECTOMY    . TONSILLECTOMY    . TUBAL LIGATION      Family History  Problem Relation Age of Onset  . Breast cancer Mother   . Cancer Mother   . Breast cancer Maternal Aunt   . Diabetes Maternal Aunt   . Hypertension Maternal Grandmother     Allergies  Allergen Reactions  . Penicillins Anaphylaxis and Rash  . Cefdinir [Cefdinir] Swelling  . Amoxicillin Rash  . Latex Rash  . Nickel Hives and Rash  . Vicodin [Hydrocodone-Acetaminophen] Rash  Current Outpatient Medications on File Prior to Visit  Medication Sig Dispense Refill  . cyclobenzaprine (FLEXERIL) 10 MG tablet Take 10 mg as needed by mouth for muscle spasms (back / ankle pain).    . diazepam (VALIUM) 5 MG tablet Take 1 tablet (5 mg total) by mouth every 12 (twelve) hours as needed for anxiety. 10 tablet 0  . esomeprazole (NEXIUM) 20 MG capsule Take 20 mg by mouth 2 (two) times daily before a meal.    . gabapentin (NEURONTIN) 300 MG capsule Take 300 mg by mouth daily as needed (nerve pain). Reported on 12/13/2015    . ibuprofen (ADVIL,MOTRIN) 200 MG tablet Take 200 mg by mouth every 6 (six) hours as needed.    . loratadine (CLARITIN) 10 MG tablet Take 10 mg daily as needed by mouth for allergies.    . metoprolol  tartrate (LOPRESSOR) 50 MG tablet Take 50 mg by mouth.    . naproxen (NAPROSYN) 500 MG tablet Take 500 mg as needed by mouth.    Marland Kitchen omeprazole (PRILOSEC) 20 MG capsule Take 20 mg daily by mouth.    . cetirizine (ZYRTEC) 10 MG tablet Take 10 mg by mouth daily.     No current facility-administered medications on file prior to visit.     BP (!) 157/85 (BP Location: Right Arm, Cuff Size: Large)   Pulse 83   Temp 98 F (36.7 C) (Oral)   Ht 5\' 7"  (1.702 m)   Wt 240 lb (108.9 kg)   SpO2 100%   BMI 37.59 kg/m        Objective:   Physical Exam  Constitutional: She is oriented to person, place, and time. She appears well-developed and well-nourished.  HENT:  Head: Normocephalic and atraumatic.  Right Ear: Tympanic membrane and ear canal normal.  Left Ear: Tympanic membrane and ear canal normal.  Cardiovascular: Normal rate, regular rhythm and normal heart sounds.  No murmur heard. Pulmonary/Chest: Effort normal and breath sounds normal. No respiratory distress. She has no wheezes.  Genitourinary: Vagina normal. No vaginal discharge found.  Musculoskeletal: She exhibits no edema.  Neurological: She is alert and oriented to person, place, and time.  Skin: Skin is warm and dry.  Psychiatric: She has a normal mood and affect. Her behavior is normal. Judgment and thought content normal.          Assessment & Plan:  Vaginitis- vaginal swab obtained- await results prior to treatment.  Hypertension- BP elevated today. Pt resistant to changing medication. If still elevated next visit will need further adjustment.  BP Readings from Last 3 Encounters:  07/29/17 (!) 157/85  02/04/17 (!) 150/94  12/13/15 (!) 155/88   Urinary frequency- check UA/Culture,glucose   A total of 35  minutes were spent face-to-face with the patient during this encounter and over half of that time was spent on counseling and coordination of care. The patient was counseled on diet/exercise/weight loss, low  sodium diet.

## 2017-07-29 NOTE — Patient Instructions (Signed)
You will be contacted about your referral to neurosurgery for your back pain. Please complete lab work prior to leaving. We will let you know about the results of your vaginal swab and further recommendations.  Welcome to [facility]

## 2017-07-29 NOTE — Telephone Encounter (Signed)
Patient states that she forgot to request refills on Omeprazole, metoprolol tartrate sent to [facility] on file [address] during her new patient visit today.

## 2017-07-30 LAB — URINE CULTURE
MICRO NUMBER:: 81277732
Result:: NO GROWTH
SPECIMEN QUALITY:: ADEQUATE

## 2017-07-30 LAB — TIQ-MISC

## 2017-07-30 NOTE — Telephone Encounter (Signed)
Med list also notes nexium on med list as well as omeprazole. Left detailed message on voicemail to call and verify which medication she is taking and how often she is taking medication. Awaiting callback.

## 2017-07-31 ENCOUNTER — Telehealth: Payer: Self-pay | Admitting: Family

## 2017-07-31 DIAGNOSIS — M545 Low back pain, unspecified: Secondary | ICD-10-CM | POA: Insufficient documentation

## 2017-07-31 DIAGNOSIS — J302 Other seasonal allergic rhinitis: Secondary | ICD-10-CM | POA: Insufficient documentation

## 2017-07-31 DIAGNOSIS — I10 Essential (primary) hypertension: Secondary | ICD-10-CM | POA: Insufficient documentation

## 2017-07-31 DIAGNOSIS — G8929 Other chronic pain: Secondary | ICD-10-CM | POA: Insufficient documentation

## 2017-07-31 DIAGNOSIS — K219 Gastro-esophageal reflux disease without esophagitis: Secondary | ICD-10-CM | POA: Insufficient documentation

## 2017-07-31 LAB — CERVICOVAGINAL ANCILLARY ONLY
Bacterial vaginitis: NEGATIVE
Candida vaginitis: NEGATIVE
Chlamydia: NEGATIVE
Neisseria Gonorrhea: NEGATIVE
Trichomonas: NEGATIVE

## 2017-07-31 NOTE — Telephone Encounter (Signed)
Patient called in requesting to have refills on Nexuim,, Please sent to [facility] on [address].  Please call patient back at [phone].

## 2017-07-31 NOTE — Assessment & Plan Note (Signed)
Stable, continue claritin, add flonase 2 sprays each nostril once daily due to ear pressure- may have some mild eustachian tube dysfunction.

## 2017-07-31 NOTE — Assessment & Plan Note (Signed)
Stable on PPI, continue same.  

## 2017-07-31 NOTE — Assessment & Plan Note (Signed)
Uncontrolled. Refer to neurosurgery for further evaluation.

## 2017-08-01 ENCOUNTER — Telehealth: Payer: Self-pay | Admitting: *Deleted

## 2017-08-01 MED ORDER — METOPROLOL TARTRATE 50 MG PO TABS
50.0000 mg | ORAL_TABLET | Freq: Two times a day (BID) | ORAL | 1 refills | Status: DC
Start: 2017-08-01 — End: 2018-03-05

## 2017-08-01 MED ORDER — OMEPRAZOLE 20 MG PO CPDR: 20.0000 mg | DELAYED_RELEASE_CAPSULE | Freq: Every day | ORAL | 1 refills | Status: DC

## 2017-08-01 NOTE — Telephone Encounter (Signed)
-----   Message from Caffie Pinto sent at 07/31/2017  2:31 PM EST ----- Regarding: Urine sample This patient was here 07-29-17, She had a urinalysis orderd for [facility]. They cancelled it because they changed the tube it  Comes in so we sent the wrong container. The culture has been done, please let me know if we need to call her for recollection.

## 2017-08-01 NOTE — Telephone Encounter (Signed)
See phone note from 07/29/17.

## 2017-08-01 NOTE — Telephone Encounter (Signed)
Melissa-- please advise? 

## 2017-08-01 NOTE — Telephone Encounter (Signed)
Spoke with pt. She states she has been taking omeprazole most recently and symptoms were well controlled. Also reports that she has been taking Metoprolol 50mg  twice a day. Sent refills on omeprazole and metoprolol.

## 2017-08-01 NOTE — Telephone Encounter (Signed)
Received request for Medical Records from [facility]; forwarded to [place] for email/scana/SLS 11/16

## 2017-08-01 NOTE — Telephone Encounter (Signed)
No need to repeat

## 2017-08-10 NOTE — Progress Notes (Signed)
Subjective:    Patient ID: Felicia Chase, female    DOB: 07-15-1961, 56 y.o.   MRN: [id number]  HPI  Felicia Chase is a 56 yr old female who presents today for follow up of her blood pressure. Last visit BP was elevated and she was resistant to changing her bp medications. She is maintained on metoprolol.    BP Readings from Last 3 Encounters:  08/11/17 (!) 146/85  07/29/17 (!) 157/85  02/04/17 (!) 150/94   Vaginal irritation- swab was negative for yeast, bacteria, trich, GC/Chlamydia.  Reports only mild irritation at this point.    Also here requesting removal of multiple neck skin tags.    Review of Systems See HPI  Past Medical History:  Diagnosis Date  . Acid reflux   . Allergy   . Arthritis   . GERD (gastroesophageal reflux disease)   . H/O seasonal allergies   . Hypertension      Social History   Socioeconomic History  . Marital status: Single    Spouse name: Not on file  . Number of children: Not on file  . Years of education: Not on file  . Highest education level: Not on file  Social Needs  . Financial resource strain: Not on file  . Food insecurity - worry: Not on file  . Food insecurity - inability: Not on file  . Transportation needs - medical: Not on file  . Transportation needs - non-medical: Not on file  Occupational History  . Not on file  Tobacco Use  . Smoking status: Never Smoker  . Smokeless tobacco: Never Used  Substance and Sexual Activity  . Alcohol use: Yes    Comment: occasional wine  . Drug use: No  . Sexual activity: No    Birth control/protection: Surgical  Other Topics Concern  . Not on file  Social History Narrative  . Not on file    Past Surgical History:  Procedure Laterality Date  . ANKLE SURGERY    . CHOLECYSTECTOMY    . TONSILLECTOMY    . TUBAL LIGATION      Family History  Problem Relation Age of Onset  . Breast cancer Mother   . Cancer Mother   . Breast cancer Maternal Aunt   . Diabetes Maternal  Aunt   . Hypertension Maternal Grandmother     Allergies  Allergen Reactions  . Penicillins Anaphylaxis and Rash  . Cefdinir [Cefdinir] Swelling  . Amoxicillin Rash  . Latex Rash  . Nickel Hives and Rash  . Vicodin [Hydrocodone-Acetaminophen] Rash    Current Outpatient Medications on File Prior to Visit  Medication Sig Dispense Refill  . cetirizine (ZYRTEC) 10 MG tablet Take 10 mg by mouth daily.    . cyclobenzaprine (FLEXERIL) 10 MG tablet Take 10 mg as needed by mouth for muscle spasms (back / ankle pain).    . diazepam (VALIUM) 5 MG tablet Take 1 tablet (5 mg total) by mouth every 12 (twelve) hours as needed for anxiety. 10 tablet 0  . esomeprazole (NEXIUM) 20 MG capsule Take 20 mg by mouth 2 (two) times daily before a meal.    . gabapentin (NEURONTIN) 300 MG capsule Take 300 mg by mouth daily as needed (nerve pain). Reported on 12/13/2015    . ibuprofen (ADVIL,MOTRIN) 200 MG tablet Take 200 mg by mouth every 6 (six) hours as needed.    . loratadine (CLARITIN) 10 MG tablet Take 10 mg daily as needed by mouth for allergies.    Marland Kitchen  metoprolol tartrate (LOPRESSOR) 50 MG tablet Take 1 tablet (50 mg total) 2 (two) times daily by mouth. 180 tablet 1  . naproxen (NAPROSYN) 500 MG tablet Take 500 mg as needed by mouth.    Marland Kitchen omeprazole (PRILOSEC) 20 MG capsule Take 1 capsule (20 mg total) daily by mouth. 90 capsule 1   No current facility-administered medications on file prior to visit.     BP (!) 146/85 (BP Location: Right Arm, Patient Position: Sitting, Cuff Size: Large)   Pulse 84   Temp 98.1 F (36.7 C) (Oral)   Resp 18   Ht 5\' 7"  (1.702 m)   Wt 239 lb (108.4 kg)   SpO2 100%   BMI 37.43 kg/m       Objective:   Physical Exam  Constitutional: She is oriented to person, place, and time. She appears well-developed and well-nourished.  Neurological: She is alert and oriented to person, place, and time.  Skin: Skin is warm and dry.  Multiple benign appearing skin tags noted  bilateral neck  Psychiatric: She has a normal mood and affect. Her behavior is normal. Judgment and thought content normal.          Assessment & Plan:  Skin Tags- Procedure including risks/benefits (including but not limited to infection, scarring) explained to patient.  Questions were answered. After informed consent was obtained and a time out completed, sites were cleansed with betadine 1% Lidocaine with epinephrine was injected at base of 9 skin tags.  Skin tags then removed with sterile dermablade and then areas were cauterized to obtain hemostasis. 9 smaller skin tags on neck were frozen using liquid nitrogen.  Pt tolerated procedure well. Pt instructed to keep the area dry for 24 hours and to contact us if she develops redness, drainage or swelling at the site.  Pt may use tylenol as needed for discomfort today.   HTN- uncontrolled. Add amlodipine 5mg  once daily.   Vaginal irritation- mild. Suspect mild atrophic vaginitis. Advised pt to call if symptoms worsen.

## 2017-08-11 ENCOUNTER — Ambulatory Visit (INDEPENDENT_AMBULATORY_CARE_PROVIDER_SITE_OTHER): Payer: 59 | Admitting: Family

## 2017-08-11 ENCOUNTER — Encounter: Payer: Self-pay | Admitting: Family

## 2017-08-11 VITALS — BP 146/85 | HR 84 | Temp 98.1°F | Resp 18 | Ht 67.0 in | Wt 239.0 lb

## 2017-08-11 DIAGNOSIS — I10 Essential (primary) hypertension: Secondary | ICD-10-CM

## 2017-08-11 DIAGNOSIS — N898 Other specified noninflammatory disorders of vagina: Secondary | ICD-10-CM

## 2017-08-11 DIAGNOSIS — L918 Other hypertrophic disorders of the skin: Secondary | ICD-10-CM | POA: Diagnosis not present

## 2017-08-11 MED ORDER — AMLODIPINE BESYLATE 5 MG PO TABS: 5.0000 mg | ORAL_TABLET | Freq: Every day | ORAL | 3 refills | Status: DC

## 2017-08-11 NOTE — Patient Instructions (Addendum)
Please add amlodipine 5mg  once daily.  Keep neck clean and dry for 24 hours.  You may shower tomorrow. Call if you develop redness/drainage.

## 2017-09-22 ENCOUNTER — Ambulatory Visit: Payer: 59 | Admitting: Family

## 2017-09-22 ENCOUNTER — Other Ambulatory Visit (HOSPITAL_COMMUNITY)
Admission: RE | Admit: 2017-09-22 | Discharge: 2017-09-22 | Disposition: A | Discharge status: Home / Self Care | Payer: 59 | Source: Ambulatory Visit | Attending: Family | Admitting: Family

## 2017-09-22 ENCOUNTER — Encounter: Payer: Self-pay | Admitting: Family

## 2017-09-22 VITALS — BP 160/85 | HR 75 | Temp 98.1°F | Resp 16 | Ht 67.0 in | Wt 240.0 lb

## 2017-09-22 DIAGNOSIS — Z0000 Encounter for general adult medical examination without abnormal findings: Secondary | ICD-10-CM | POA: Diagnosis not present

## 2017-09-22 DIAGNOSIS — I10 Essential (primary) hypertension: Secondary | ICD-10-CM

## 2017-09-22 DIAGNOSIS — N760 Acute vaginitis: Secondary | ICD-10-CM

## 2017-09-22 DIAGNOSIS — Z01419 Encounter for gynecological examination (general) (routine) without abnormal findings: Secondary | ICD-10-CM

## 2017-09-22 MED ORDER — MOMETASONE FUROATE 50 MCG/ACT NA SUSP: 2.0000 | Freq: Every day | NASAL | 12 refills | Status: DC

## 2017-09-22 NOTE — Progress Notes (Signed)
kg

## 2017-09-22 NOTE — Patient Instructions (Addendum)
Please complete lab work prior to leaving.  Work on [employer], regular exercise and weight loss. Restart amlodipine.

## 2017-09-22 NOTE — Progress Notes (Addendum)
Subjective:    Patient ID: Felicia Chase, female    DOB: 03/24/1961, 57 y.o.   MRN: [id number]  HPI  Patient presents today for complete physical.  Immunizations: tetanus 7/17,  Declines flu shot Diet: starting a healthier diet Exercise:  Started the gym last week Colonoscopy: 5/14 Dexa:  due Pap Smear: reports that this was done 1 year ago- by [facility] Vision: up to date Mammogram: 5/18  HTN- last visit we added amlodipine 5mg  once daily. Reports that she felt "like my heart was racing" after she started amlodipine.  Last dose was one day about 1 week.   BP Readings from Last 3 Encounters:  09/22/17 (!) 160/85  08/11/17 (!) 146/85  07/29/17 (!) 157/85   Wt Readings from Last 3 Encounters:  09/22/17 240 lb (108.9 kg)  08/11/17 239 lb (108.4 kg)  07/29/17 240 lb (108.9 kg)    Reports that she was dating someone who was fooling around.  Having some vaginal itching.   Review of Systems  Constitutional: Negative for unexpected weight change.  HENT: Positive for rhinorrhea.        Notes rhinorrhea, ears feel full, thick sputum, allergy symptoms  Respiratory: Negative for cough.   Cardiovascular: Negative for leg swelling.  Gastrointestinal: Negative for constipation and diarrhea.  Genitourinary: Negative for dysuria and frequency.  Musculoskeletal: Negative for arthralgias and myalgias.  Skin: Negative for rash.  Neurological: Negative for headaches.  Hematological: Negative for adenopathy.  Psychiatric/Behavioral:       Denies depression/anxiety   Past Medical History:  Diagnosis Date  . Acid reflux   . Allergy   . Arthritis   . GERD (gastroesophageal reflux disease)   . H/O seasonal allergies   . Hypertension      Social History   Socioeconomic History  . Marital status: Single    Spouse name: Not on file  . Number of children: Not on file  . Years of education: Not on file  . Highest education level: Not on file  Social Needs   . Financial resource strain: Not on file  . Food insecurity - worry: Not on file  . Food insecurity - inability: Not on file  . Transportation needs - medical: Not on file  . Transportation needs - non-medical: Not on file  Occupational History  . Not on file  Tobacco Use  . Smoking status: Never Smoker  . Smokeless tobacco: Never Used  Substance and Sexual Activity  . Alcohol use: Yes    Comment: occasional wine  . Drug use: No  . Sexual activity: No    Birth control/protection: Surgical  Other Topics Concern  . Not on file  Social History Narrative  . Not on file    Past Surgical History:  Procedure Laterality Date  . ANKLE SURGERY    . CHOLECYSTECTOMY    . TONSILLECTOMY    . TUBAL LIGATION      Family History  Problem Relation Age of Onset  . Breast cancer Mother   . Cancer Mother   . Breast cancer Maternal Aunt   . Diabetes Maternal Aunt   . Hypertension Maternal Grandmother     Allergies  Allergen Reactions  . Penicillins Anaphylaxis and Rash  . Cefdinir [Cefdinir] Swelling  . Amoxicillin Rash  . Latex Rash  . Nickel Hives and Rash  . Vicodin [Hydrocodone-Acetaminophen] Rash    Current Outpatient Medications on File Prior to Visit  Medication Sig Dispense Refill  . amLODipine (  NORVASC) 5 MG tablet Take 1 tablet (5 mg total) by mouth daily. 30 tablet 3  . cetirizine (ZYRTEC) 10 MG tablet Take 10 mg by mouth daily.    . cyclobenzaprine (FLEXERIL) 10 MG tablet Take 10 mg as needed by mouth for muscle spasms (back / ankle pain).    . metoprolol tartrate (LOPRESSOR) 50 MG tablet Take 1 tablet (50 mg total) 2 (two) times daily by mouth. 180 tablet 1  . naproxen (NAPROSYN) 500 MG tablet Take 500 mg as needed by mouth.    Marland Kitchen omeprazole (PRILOSEC) 20 MG capsule Take 1 capsule (20 mg total) daily by mouth. 90 capsule 1   No current facility-administered medications on file prior to visit.     BP (!) 160/85 (BP Location: Right Arm, Patient Position: Sitting,  Cuff Size: Large)   Pulse 75   Temp 98.1 F (36.7 C) (Oral)   Resp 16   Ht 5\' 7"  (1.702 m)   Wt 240 lb (108.9 kg)   SpO2 100%   BMI 37.59 kg/m       Objective:   Physical Exam Physical Exam  Constitutional: She is oriented to person, place, and time. She appears well-developed and well-nourished. No distress.  HENT:  Head: Normocephalic and atraumatic.  Right Ear: Tympanic membrane and ear canal normal.  Left Ear: Tympanic membrane and ear canal normal.  Mouth/Throat: Oropharynx is clear and moist.  Eyes: Pupils are equal, round, and reactive to light. No scleral icterus.  Neck: Normal range of motion. No thyromegaly present.  Cardiovascular: Normal rate and regular rhythm.   No murmur heard. Pulmonary/Chest: Effort normal and breath sounds normal. No respiratory distress. He has no wheezes. She has no rales. She exhibits no tenderness.  Abdominal: Soft. Bowel sounds are normal. She exhibits no distension and no mass. There is no tenderness. There is no rebound and no guarding.  Musculoskeletal: She exhibits no edema.  Lymphadenopathy:    She has no cervical adenopathy.  Neurological: She is alert and oriented to person, place, and time. She has normal patellar reflexes. She exhibits normal muscle tone. Coordination normal.  Skin: Skin is warm and dry.  Psychiatric: She has a normal mood and affect. Her behavior is normal. Judgment and thought content normal.  Breasts: Examined lying Right: Without masses, retractions, discharge or axillary adenopathy.  Left: Without masses, retractions, discharge or axillary adenopathy.  GYN: normal external genitalia           Assessment & Plan:        Assessment & Plan:  Preventative Care- discussed healthy diet, exercise, weight loss. Declines flu shot. EKG tracing is personally reviewed.  EKG notes NSR.  No acute changes.   Vaginitis- swab sent for GC/Chlamydia, yeast, bv, trich.    HTN- uncontrolled. Restart amlodipine.      Mild anemia- add mvi with minerals and complete IFOB- see phone note.

## 2017-09-23 LAB — URINALYSIS, ROUTINE W REFLEX MICROSCOPIC
Bilirubin Urine: NEGATIVE
Hgb urine dipstick: NEGATIVE
Ketones, ur: NEGATIVE
Leukocytes, UA: NEGATIVE
Nitrite: NEGATIVE
RBC / HPF: NONE SEEN (ref 0–?)
Specific Gravity, Urine: 1.02 (ref 1.000–1.030)
Total Protein, Urine: NEGATIVE
Urine Glucose: NEGATIVE
Urobilinogen, UA: 0.2 (ref 0.0–1.0)
pH: 6.5 (ref 5.0–8.0)

## 2017-09-23 LAB — LIPID PANEL
Cholesterol: 204 mg/dL — ABNORMAL HIGH (ref 0–200)
HDL: 40.9 mg/dL (ref >39.00)
LDL Cholesterol: 141 mg/dL — ABNORMAL HIGH (ref 0–99)
NonHDL: 163.47
Total CHOL/HDL Ratio: 5
Triglycerides: 112 mg/dL (ref 0.0–149.0)
VLDL: 22.4 mg/dL (ref 0.0–40.0)

## 2017-09-23 LAB — CBC WITH DIFFERENTIAL/PLATELET
Basophils Absolute: 0.1 10*3/uL (ref 0.0–0.1)
Basophils Relative: 0.6 % (ref 0.0–3.0)
Eosinophils Absolute: 0.1 10*3/uL (ref 0.0–0.7)
Eosinophils Relative: 0.6 % (ref 0.0–5.0)
HCT: 37.1 % (ref 36.0–46.0)
Hemoglobin: 11.8 g/dL — ABNORMAL LOW (ref 12.0–15.0)
Lymphocytes Relative: 33.5 % (ref 12.0–46.0)
Lymphs Abs: 3.1 10*3/uL (ref 0.7–4.0)
MCHC: 31.7 g/dL (ref 30.0–36.0)
MCV: 84.2 fl (ref 78.0–100.0)
Monocytes Absolute: 0.7 10*3/uL (ref 0.1–1.0)
Monocytes Relative: 7.4 % (ref 3.0–12.0)
Neutro Abs: 5.4 10*3/uL (ref 1.4–7.7)
Neutrophils Relative %: 57.9 % (ref 43.0–77.0)
Platelets: 229 10*3/uL (ref 150.0–400.0)
RBC: 4.41 Mil/uL (ref 3.87–5.11)
RDW: 15 % (ref 11.5–15.5)
WBC: 9.3 10*3/uL (ref 4.0–10.5)

## 2017-09-23 LAB — COMPREHENSIVE METABOLIC PANEL
ALT: 15 U/L (ref 0–35)
AST: 14 U/L (ref 0–37)
Albumin: 4.5 g/dL (ref 3.5–5.2)
Alkaline Phosphatase: 76 U/L (ref 39–117)
BUN: 13 mg/dL (ref 6–23)
CO2: 29 mEq/L (ref 19–32)
Calcium: 9.1 mg/dL (ref 8.4–10.5)
Chloride: 107 mEq/L (ref 96–112)
Creatinine, Ser: 0.8 mg/dL (ref 0.40–1.20)
GFR: 95.35 mL/min (ref >60.00)
Glucose, Bld: 106 mg/dL — ABNORMAL HIGH (ref 70–99)
Potassium: 3.9 mEq/L (ref 3.5–5.1)
Sodium: 142 mEq/L (ref 135–145)
Total Bilirubin: 0.3 mg/dL (ref 0.2–1.2)
Total Protein: 7.4 g/dL (ref 6.0–8.3)

## 2017-09-23 LAB — TSH: TSH: 1.71 u[IU]/mL (ref 0.35–4.50)

## 2017-09-24 ENCOUNTER — Telehealth: Payer: Self-pay | Admitting: *Deleted

## 2017-09-24 ENCOUNTER — Telehealth: Payer: Self-pay | Admitting: Family

## 2017-09-24 DIAGNOSIS — D649 Anemia, unspecified: Secondary | ICD-10-CM

## 2017-09-24 LAB — CERVICOVAGINAL ANCILLARY ONLY
Bacterial vaginitis: NEGATIVE
Candida vaginitis: POSITIVE — AB
Chlamydia: NEGATIVE
Neisseria Gonorrhea: NEGATIVE
Trichomonas: NEGATIVE

## 2017-09-24 LAB — HIV ANTIBODY (ROUTINE TESTING W REFLEX): HIV 1&2 Ab, 4th Generation: NONREACTIVE

## 2017-09-24 MED ORDER — FLUCONAZOLE 150 MG PO TABS: ORAL_TABLET | ORAL | 0 refills | Status: DC

## 2017-09-24 MED ORDER — MULTI-VITAMIN/MINERALS PO TABS: 1.0000 | ORAL_TABLET | Freq: Every day | ORAL | Status: DC

## 2017-09-24 MED ORDER — FLUNISOLIDE 25 MCG/ACT (0.025%) NA SOLN: 1.0000 | Freq: Two times a day (BID) | NASAL | 3 refills | Status: DC

## 2017-09-24 NOTE — Telephone Encounter (Signed)
Please advise pt. She did not want fluticasone. Will send flunisolide instead.

## 2017-09-24 NOTE — Telephone Encounter (Signed)
Also, please ask her to complete IFOB dx anemia.

## 2017-09-24 NOTE — Telephone Encounter (Signed)
Received fax from [place] stating Mometasone FUR 53mcg is not covered by insurance. They prefer fluticasone or flunisolide.  Please advise?

## 2017-09-24 NOTE — Telephone Encounter (Signed)
HIV antibody has resulted from 09/22/17. Is there anything else you want [place] to add?

## 2017-09-24 NOTE — Telephone Encounter (Signed)
Could you please ask lab to add on HIV screen to her labs?

## 2017-09-24 NOTE — Telephone Encounter (Signed)
Also, labs show vaginal yeast infection. Rx sent for diflucan.    HIV testing is negative. Gc/chlamydia, trich negative.   Blood work shows mild anemia.  Add mvi with minerals once daily

## 2017-09-24 NOTE — Telephone Encounter (Signed)
No thank you

## 2017-09-25 ENCOUNTER — Telehealth: Payer: Self-pay | Admitting: Family

## 2017-09-25 NOTE — Telephone Encounter (Signed)
Copied from [place] [id number]. Topic: General - Other >> Sep 25, 2017  2:43 PM Yvette Rack wrote: Reason for CRM: patient calling about lab results

## 2017-09-25 NOTE — Telephone Encounter (Signed)
Copied from [place] [phone]. Topic: General - Other >> Sep 25, 2017  2:46 PM Yvette Rack wrote: Reason for CRM: patient calling about lab results

## 2017-09-26 MED ORDER — CETIRIZINE HCL 10 MG PO TABS: 10.0000 mg | ORAL_TABLET | Freq: Every day | ORAL | 5 refills | Status: DC

## 2017-09-26 NOTE — Addendum Note (Signed)
Addended by: Kelle Darting A on: 09/26/2017 12:16 PM   Modules accepted: Orders

## 2017-09-26 NOTE — Telephone Encounter (Signed)
See 09/24/17 phone note.

## 2017-09-26 NOTE — Telephone Encounter (Signed)
Notified pt and she voices understanding.  IFOB mailed to pt. Order entered.

## 2017-09-29 ENCOUNTER — Ambulatory Visit: Payer: 59 | Admitting: Family

## 2017-09-29 ENCOUNTER — Telehealth: Payer: Self-pay | Admitting: Family

## 2017-09-29 NOTE — Telephone Encounter (Signed)
Patient had requested NS referral at her last visit. I reviewed her record.  It appears that she was discharged from [facility] in 2006 and a referral was made to [facility]. Left message for patient to let me know if she had been contacted about appointment with [facility].

## 2017-10-02 NOTE — Telephone Encounter (Signed)
Could you please check back with pt re: appointment info below?

## 2017-10-03 NOTE — Telephone Encounter (Signed)
Copied from [place] [phone]. Topic: Quick Communication - See Telephone Encounter >> Oct 03, 2017  9:59 AM Ronny Flurry, CMA wrote: CRM for notification. See Telephone encounter for: 10/02/17.  [place] for [facility] / Triage to discuss and obtain info from pt. Thanks1 >> Oct 03, 2017  4:00 PM Cleaster Corin, NT wrote: Pt. Calling to let Dr. Inda Castle know that she has not heard anything yet from [facility]. Pt. Also wants to let Dr. Inda Castle know that she still hasn't received the med. Flunisolide due to pharmacy not having med. Pt. Has called to several pharmacies and they don't have it at all.

## 2017-10-03 NOTE — Telephone Encounter (Signed)
Left message for pt to return my call.

## 2017-10-03 NOTE — Telephone Encounter (Signed)
Tricia-could you please check the status of her referral to [facility] please contact patient and let her know that I recommend she try an over-the-counter nasal  steroid spray.  She can use Nasonex or Flonase.   Gwen, neurosurgery in [facility]?

## 2017-10-07 ENCOUNTER — Ambulatory Visit (INDEPENDENT_AMBULATORY_CARE_PROVIDER_SITE_OTHER): Payer: 59 | Admitting: Internal Medicine

## 2017-10-07 ENCOUNTER — Other Ambulatory Visit: Payer: Self-pay

## 2017-10-07 VITALS — BP 132/85 | HR 80

## 2017-10-07 DIAGNOSIS — I10 Essential (primary) hypertension: Secondary | ICD-10-CM | POA: Diagnosis not present

## 2017-10-07 MED ORDER — AMLODIPINE BESYLATE 5 MG PO TABS: 5.0000 mg | ORAL_TABLET | Freq: Every day | ORAL | 3 refills | Status: DC

## 2017-10-07 NOTE — Telephone Encounter (Signed)
Felicia Chase-- can you help locate another neurosurgeon in [facility] or [place] per pt's request?  Pt came in to the office stating she went for visit in December to Dr Huntley [facility] office and was told her copay would be $100. She was unable to pay the full amount that day so the office rescheduled her appt stating they would have to have the full copay. Pt states she has care credit and that office told her they do not accept care credit. Pt would like to see a different group in [place] or [place]. She does not want to see Dr Arrie Eastern in [facility].

## 2017-10-07 NOTE — Progress Notes (Signed)
Pre visit review using our clinic tool,if applicable. No additional management support is needed unless otherwise documented below in the visit note.   Patient in for BP check per order from M. Osullivan.  Last BP = 160/85 P= 75    Restarted Amlodipine 5 mg and is taking Metoprolol 50 mg bid.  BP today = 132/85  Per Dr.Paz continue BP medications as ordered. Check BP weekly if over 140/90 call provider and  scheduled appointment with  Provider. Patient agreed.   Kathlene November, MD

## 2017-10-07 NOTE — Telephone Encounter (Signed)
Referral coordinator spoke with Dr Huntley Dec office, [phone] and was told that pt was scheduled with them twice (08/21/17 and 09/19/17) and cancelled both times due "no copay". She states that their office does have financial assistance and the pt could complete an application in their office if that is needed. Attempted to reach pt and left detailed message on cell# of above and left # for pt to call Dr Huntley Dec office to schedule appt and also that she can try Nasonex or Flonase as indicated below and to call if any questions.

## 2017-10-08 NOTE — Telephone Encounter (Signed)
Referral sent to [facility] in [place]

## 2017-10-09 ENCOUNTER — Encounter: Payer: Self-pay | Admitting: Internal Medicine

## 2017-10-09 NOTE — Progress Notes (Signed)
Pre visit review using our clinic tool,if applicable. No additional management support is needed unless otherwise documented below in the visit note.  

## 2017-10-09 NOTE — Progress Notes (Signed)
Error

## 2017-10-13 ENCOUNTER — Encounter: Payer: Self-pay | Admitting: Family

## 2017-10-13 ENCOUNTER — Ambulatory Visit: Payer: 59 | Admitting: Family

## 2017-10-13 VITALS — BP 150/72 | HR 80 | Temp 98.1°F | Resp 16 | Ht 67.0 in | Wt 241.0 lb

## 2017-10-13 DIAGNOSIS — J329 Chronic sinusitis, unspecified: Secondary | ICD-10-CM

## 2017-10-13 DIAGNOSIS — G4700 Insomnia, unspecified: Secondary | ICD-10-CM

## 2017-10-13 DIAGNOSIS — R439 Unspecified disturbances of smell and taste: Secondary | ICD-10-CM | POA: Diagnosis not present

## 2017-10-13 DIAGNOSIS — I10 Essential (primary) hypertension: Secondary | ICD-10-CM | POA: Diagnosis not present

## 2017-10-13 MED ORDER — DOXYCYCLINE HYCLATE 100 MG PO TABS: 100.0000 mg | ORAL_TABLET | Freq: Two times a day (BID) | ORAL | 0 refills | Status: DC

## 2017-10-13 MED ORDER — FLUCONAZOLE 150 MG PO TABS
ORAL_TABLET | ORAL | 0 refills | Status: DC
Start: 2017-10-13 — End: 2017-12-22

## 2017-10-13 MED ORDER — OMEPRAZOLE 40 MG PO CPDR: 40.0000 mg | DELAYED_RELEASE_CAPSULE | Freq: Every day | ORAL | 1 refills | Status: DC

## 2017-10-13 NOTE — Progress Notes (Signed)
Subjective:    Patient ID: Felicia Chase, female    DOB: 1961/01/03, 57 y.o.   MRN: [id number]  HPI  Felicia Chase is a 57 yr old female who presents today with report of thick nasal discharge (clear), and reports decrease in her smell.   Reports that she first noticed smell issue >6 months ago but did not have insurance.  Notes some mild associated balance issues. Nasal congestion has been present for >2 weeks.   Having insomnia.  Wakes up every 20-30 minutes,  Snores. Reports that she had a sleep study 2 years ago which was negative.     Did not take AM amlodipine- ran out.  BP Readings from Last 3 Encounters:  10/13/17 (!) 150/72  10/07/17 132/85  09/22/17 (!) 160/85    Review of Systems See HPI  Past Medical History:  Diagnosis Date  . Acid reflux   . Allergy   . Arthritis   . GERD (gastroesophageal reflux disease)   . H/O seasonal allergies   . Hypertension      Social History   Socioeconomic History  . Marital status: Single    Spouse name: Not on file  . Number of children: Not on file  . Years of education: Not on file  . Highest education level: Not on file  Social Needs  . Financial resource strain: Not on file  . Food insecurity - worry: Not on file  . Food insecurity - inability: Not on file  . Transportation needs - medical: Not on file  . Transportation needs - non-medical: Not on file  Occupational History  . Not on file  Tobacco Use  . Smoking status: Never Smoker  . Smokeless tobacco: Never Used  Substance and Sexual Activity  . Alcohol use: Yes    Comment: occasional wine  . Drug use: No  . Sexual activity: No    Birth control/protection: Surgical  Other Topics Concern  . Not on file  Social History Narrative  . Not on file    Past Surgical History:  Procedure Laterality Date  . ANKLE SURGERY    . CHOLECYSTECTOMY    . TONSILLECTOMY    . TUBAL LIGATION      Family History  Problem Relation Age of Onset  . Breast cancer  Mother   . Cancer Mother   . Breast cancer Maternal Aunt   . Diabetes Maternal Aunt   . Hypertension Maternal Grandmother     Allergies  Allergen Reactions  . Penicillins Anaphylaxis and Rash  . Cefdinir [Cefdinir] Swelling  . Amoxicillin Rash  . Latex Rash  . Nickel Hives and Rash  . Vicodin [Hydrocodone-Acetaminophen] Rash    Current Outpatient Medications on File Prior to Visit  Medication Sig Dispense Refill  . amLODipine (NORVASC) 5 MG tablet Take 1 tablet (5 mg total) by mouth daily. 30 tablet 3  . cetirizine (ZYRTEC) 10 MG tablet Take 1 tablet (10 mg total) by mouth daily. 30 tablet 5  . cyclobenzaprine (FLEXERIL) 10 MG tablet Take 10 mg as needed by mouth for muscle spasms (back / ankle pain).    . fluconazole (DIFLUCAN) 150 MG tablet Take 1 tab by mouth now, repeat in 3 days if symptoms are not improved 2 tablet 0  . flunisolide (NASALIDE) 25 MCG/ACT (0.025%) SOLN Place 1 spray into the nose 2 (two) times daily. 1 Bottle 3  . metoprolol tartrate (LOPRESSOR) 50 MG tablet Take 1 tablet (50 mg total) 2 (two) times daily by  mouth. 180 tablet 1  . Multiple Vitamins-Minerals (MULTIVITAMIN WITH MINERALS) tablet Take 1 tablet by mouth daily.    . naproxen (NAPROSYN) 500 MG tablet Take 500 mg as needed by mouth.    Marland Kitchen omeprazole (PRILOSEC) 20 MG capsule Take 1 capsule (20 mg total) daily by mouth. 90 capsule 1   No current facility-administered medications on file prior to visit.     BP (!) 150/72 (BP Location: Right Arm, Patient Position: Sitting, Cuff Size: Large)   Pulse 80   Temp 98.1 F (36.7 C) (Oral)   Resp 16   Ht 5\' 7"  (1.702 m)   Wt 241 lb (109.3 kg)   SpO2 100%   BMI 37.75 kg/m       Objective:   Physical Exam  Constitutional: She is oriented to person, place, and time. She appears well-developed and well-nourished.  HENT:  Head: Normocephalic and atraumatic.  Right Ear: Tympanic membrane and ear canal normal.  Left Ear: Tympanic membrane and ear canal  normal.  Nose: No septal deviation.  No foreign bodies. Right sinus exhibits maxillary sinus tenderness. Left sinus exhibits maxillary sinus tenderness.  Slightly asymmetric nares, no obvious polyps  Cardiovascular: Normal rate, regular rhythm and normal heart sounds.  No murmur heard. Pulmonary/Chest: Effort normal and breath sounds normal. No respiratory distress. She has no wheezes.  Musculoskeletal: She exhibits no edema.  Neurological: She is alert and oriented to person, place, and time.  Psychiatric: She has a normal mood and affect. Her behavior is normal. Judgment and thought content normal.          Assessment & Plan:  Sinusitis- will rx with doxycycline.  Advised pt to continue nasal steroid. Requests diflucan prn in case of yeast infection following abx.   Insomnia- trial of melatonin.   Smell problem- refer to ENT for further evaluation.  HTN- bp elevated advised pt to restart amlodipine.

## 2017-10-13 NOTE — Patient Instructions (Addendum)
Try melatonin 5mg  at bedtime as needed. Begin doxycycline for sinus infection. Continue nasal spray. Restart amlodipine. Call if symptoms worsen or fail to improve. You will be contacted about your referral to ENT for your smell issues.

## 2017-10-20 ENCOUNTER — Other Ambulatory Visit: Payer: Self-pay | Admitting: *Deleted

## 2017-10-20 DIAGNOSIS — M545 Low back pain: Secondary | ICD-10-CM

## 2017-10-20 NOTE — Telephone Encounter (Signed)
Notified pt. She will contact Dr Huntley Dec office to check their appt availability. She will call us back once she speaks with them and will let us know how she is going to proceed.

## 2017-10-20 NOTE — Telephone Encounter (Signed)
Either I can place order for MRI or she can call Dr. Huntley Dec office and schedule an appointment. It is up to her.

## 2017-10-20 NOTE — Telephone Encounter (Signed)
Spoke with pt. She states that she was contacted by [facility] and told that she will need to have an MRI before they will see her (we will need to order). Pt states that she has a disability hearing on 10/30/17 and she is concerned that she will not have the information they will need by that time. She is wondering if she should go back to see Dr Rollene Rotunda that she was originally referred to but was having difficulty with them accepting her copay and requiring a greater amount to be seen. I asked pt if we could provide her with a letter stating that she has been referred to neurosurgery and is awaiting an MRI. She doesn't think this will help. Thinks she needs something that will show them what is going on with her back. I do not see that we have received communication from [facility] stating MRI is required. Left message on Valerie's voicemail at [phone] to confirm that they are requiring MRI prior to consultation.   Copied from [place]. Topic: General - Other >> Oct 17, 2017 11:59 AM Patrice Paradise wrote: Reason for CRM: Patient is requesting a call back asap for [facility] assistant, she can be reached @ [phone]. Would not elaborate on the reason for the call.

## 2017-10-21 NOTE — Telephone Encounter (Signed)
Spoke with pt. She spoke with Dr Huntley Dec office and he is on unexpected leave due to health issues and they are not able to get pt an appt in the near future. Pt would like to proceed with MRI and will need it faxed to [facility] once we get the results. She has placed a call to her disability attorney to see if letter from PCP will be needed at upcoming hearing and she will let us know the outcome. Please advise re: MRI order?

## 2017-10-24 ENCOUNTER — Encounter: Payer: Self-pay | Admitting: Family

## 2017-10-24 NOTE — Telephone Encounter (Signed)
Attempted to reach pt and left message to return my call on Monday.   Felicia Chase (Patient) Felicia Chase (Patient) Quick Communication - See Telephone Encounter  CRM for notification. See Telephone encounter for: pt is wanting to leave a message for Rhyanna Sorce regarding the letter for court -pt states that's she would know what it is about and also wants to talk about the [place] number [phone]    10/24/17.

## 2017-10-24 NOTE — Telephone Encounter (Signed)
Spoke with pt. She would like to schedule her MRI. Will need letter to take to her disability hearing on 10/29/17 and she does not have the fax # available at this time. Letter needs to state that she is under our care and has been referred to neurosurgery and is awaiting MRI and mri date. Pt is claustrophobic and will need something called in for that. Per verbal from PCP, can call in Xanax 0.5mg , 1 tablet 30 min prior to MRI and pt will need driver. She voices understanding. Rx will be called in on Monday once we see when MRI is scheduled.

## 2017-10-27 NOTE — Addendum Note (Signed)
Addended by: Kelle Darting A on: 10/27/2017 01:53 PM   Modules accepted: Orders

## 2017-10-27 NOTE — Telephone Encounter (Signed)
Felicia Chase-- pt is requesting letter for disability hearing on 11/01/17 stating she is under your care, has been referred to a neurosurgeon which is pending MRI result. MRI has been scheduled for 11/01/17 at 1:45pm.  Below Rx has been called to [facility].  Please advise letter?

## 2017-10-28 ENCOUNTER — Encounter: Payer: Self-pay | Admitting: Family

## 2017-10-28 MED ORDER — ALPRAZOLAM 0.5 MG PO TABS: ORAL_TABLET | ORAL | 0 refills | Status: DC

## 2017-10-28 NOTE — Telephone Encounter (Signed)
Letter was completed and faxed to below # and placed copy at front desk for pt to pick up tomorrow.   Carolyn Stare 10/28/2017 08:46 AM   Attn Lorrine Kin    [phone]      Message for Jani Files 10/28/2017 08:45 AM   Pt said Gilmore Laroche is waiting on this fax number to go with the letter   Fax number for letter to be fax to [phone]

## 2017-10-28 NOTE — Addendum Note (Signed)
Addended by: Kelle Darting A on: 10/28/2017 09:48 AM   Modules accepted: Orders

## 2017-11-01 ENCOUNTER — Ambulatory Visit (HOSPITAL_BASED_OUTPATIENT_CLINIC_OR_DEPARTMENT_OTHER)
Admission: RE | Admit: 2017-11-01 | Discharge: 2017-11-01 | Disposition: A | Discharge status: Home / Self Care | Payer: 59 | Source: Ambulatory Visit | Attending: Family | Admitting: Family

## 2017-11-01 DIAGNOSIS — M47817 Spondylosis without myelopathy or radiculopathy, lumbosacral region: Secondary | ICD-10-CM | POA: Insufficient documentation

## 2017-11-01 DIAGNOSIS — M545 Low back pain: Secondary | ICD-10-CM

## 2017-11-01 DIAGNOSIS — M48061 Spinal stenosis, lumbar region without neurogenic claudication: Secondary | ICD-10-CM | POA: Diagnosis not present

## 2017-11-03 ENCOUNTER — Encounter: Payer: Self-pay | Admitting: *Deleted

## 2017-11-03 ENCOUNTER — Telehealth: Payer: Self-pay | Admitting: *Deleted

## 2017-11-03 NOTE — Telephone Encounter (Signed)
Obtained fax # for [facility] in [place] [phone]). MRI result faxed.  Detailed message left on pt's voicemail regarding result and to call if any questions.

## 2017-11-03 NOTE — Telephone Encounter (Signed)
Pt called back. Notified of below and and voices understanding.

## 2017-11-03 NOTE — Telephone Encounter (Signed)
-----   Message from Debbrah Alar, NP sent at 11/02/2017  3:50 PM EST ----- Please let pt know that her MRI shows arthritis changes in her lower back.  Please forward copy to her neurosurgeon.

## 2017-11-12 NOTE — Telephone Encounter (Signed)
Pt called and requests that a copy of recent letter we faxed to attorney could be mailed to her home. Copy mailed to pt. She also states she hasn't been contacted by [facility] regarding referral yet.  Provided pt with phone # to check on appt with neurosurgeon.

## 2017-12-22 ENCOUNTER — Ambulatory Visit: Payer: 59 | Admitting: Family

## 2017-12-22 ENCOUNTER — Encounter: Payer: Self-pay | Admitting: Family

## 2017-12-22 VITALS — BP 147/72 | HR 82 | Temp 98.2°F | Resp 16 | Ht 67.0 in | Wt 239.2 lb

## 2017-12-22 DIAGNOSIS — K219 Gastro-esophageal reflux disease without esophagitis: Secondary | ICD-10-CM | POA: Diagnosis not present

## 2017-12-22 DIAGNOSIS — K625 Hemorrhage of anus and rectum: Secondary | ICD-10-CM

## 2017-12-22 DIAGNOSIS — J309 Allergic rhinitis, unspecified: Secondary | ICD-10-CM

## 2017-12-22 DIAGNOSIS — I10 Essential (primary) hypertension: Secondary | ICD-10-CM | POA: Diagnosis not present

## 2017-12-22 DIAGNOSIS — R739 Hyperglycemia, unspecified: Secondary | ICD-10-CM | POA: Diagnosis not present

## 2017-12-22 DIAGNOSIS — Z1239 Encounter for other screening for malignant neoplasm of breast: Secondary | ICD-10-CM

## 2017-12-22 NOTE — Progress Notes (Signed)
Subjective:    Patient ID: Felicia Chase, female    DOB: 08/13/1961, 57 y.o.   MRN: [id number]  HPI  Felicia Chase is a 57 yr old female who presents today for follow up.  HTN- she is maintained on amlodipine 5mg , metoprolol 50mg  bid.  Reports that she has been checking Q2 weeks at her job and has been <130/70.    BP Readings from Last 3 Encounters:  12/22/17 (!) 147/72  10/13/17 (!) 150/72  10/07/17 132/85   GERD- maintained on prilosec.  Reports that she still has some breakthrough gerd symptoms.   Allergic rhinitis- maintained on nasalide and zyrtec. Seeing allergist, has had increased congestion recently.    Review of Systems See HPI  Past Medical History:  Diagnosis Date  . Acid reflux   . Allergy   . Arthritis   . GERD (gastroesophageal reflux disease)   . H/O seasonal allergies   . Hypertension      Social History   Socioeconomic History  . Marital status: Single    Spouse name: Not on file  . Number of children: Not on file  . Years of education: Not on file  . Highest education level: Not on file  Occupational History  . Not on file  Social Needs  . Financial resource strain: Not on file  . Food insecurity:    Worry: Not on file    Inability: Not on file  . Transportation needs:    Medical: Not on file    Non-medical: Not on file  Tobacco Use  . Smoking status: Never Smoker  . Smokeless tobacco: Never Used  Substance and Sexual Activity  . Alcohol use: Yes    Comment: occasional wine  . Drug use: No  . Sexual activity: Never    Birth control/protection: Surgical  Lifestyle  . Physical activity:    Days per week: Not on file    Minutes per session: Not on file  . Stress: Not on file  Relationships  . Social connections:    Talks on phone: Not on file    Gets together: Not on file    Attends religious service: Not on file    Active member of club or organization: Not on file    Attends meetings of clubs or organizations: Not on  file    Relationship status: Not on file  . Intimate partner violence:    Fear of current or ex partner: Not on file    Emotionally abused: Not on file    Physically abused: Not on file    Forced sexual activity: Not on file  Other Topics Concern  . Not on file  Social History Narrative  . Not on file    Past Surgical History:  Procedure Laterality Date  . ANKLE SURGERY    . CHOLECYSTECTOMY    . TONSILLECTOMY    . TUBAL LIGATION      Family History  Problem Relation Age of Onset  . Breast cancer Mother   . Cancer Mother   . Breast cancer Maternal Aunt   . Diabetes Maternal Aunt   . Hypertension Maternal Grandmother     Allergies  Allergen Reactions  . Penicillins Anaphylaxis and Rash  . Cefdinir [Cefdinir] Swelling  . Amoxicillin Rash  . Latex Rash  . Nickel Hives and Rash  . Vicodin [Hydrocodone-Acetaminophen] Rash    Current Outpatient Medications on File Prior to Visit  Medication Sig Dispense Refill  . ALPRAZolam (XANAX) 0.5 MG tablet  Take 1 tablet 30 minutes prior to MRI.  Please have someone drive you to your appointment 1 tablet 0  . amLODipine (NORVASC) 5 MG tablet Take 1 tablet (5 mg total) by mouth daily. 30 tablet 3  . cetirizine (ZYRTEC) 10 MG tablet Take 1 tablet (10 mg total) by mouth daily. 30 tablet 5  . cyclobenzaprine (FLEXERIL) 10 MG tablet Take 10 mg as needed by mouth for muscle spasms (back / ankle pain).    . flunisolide (NASALIDE) 25 MCG/ACT (0.025%) SOLN Place 1 spray into the nose 2 (two) times daily. 1 Bottle 3  . metoprolol tartrate (LOPRESSOR) 50 MG tablet Take 1 tablet (50 mg total) 2 (two) times daily by mouth. 180 tablet 1  . Multiple Vitamins-Minerals (MULTIVITAMIN WITH MINERALS) tablet Take 1 tablet by mouth daily.    . naproxen (NAPROSYN) 500 MG tablet Take 500 mg as needed by mouth.    Marland Kitchen omeprazole (PRILOSEC) 40 MG capsule Take 1 capsule (40 mg total) by mouth daily. 90 capsule 1   No current facility-administered medications on  file prior to visit.     BP (!) 147/72 (BP Location: Right Arm, Patient Position: Sitting, Cuff Size: Large)   Pulse 82   Temp 98.2 F (36.8 C) (Oral)   Resp 16   Ht 5\' 7"  (1.702 m)   Wt 239 lb 3.2 oz (108.5 kg)   SpO2 100%   BMI 37.46 kg/m       Objective:   Physical Exam  Constitutional: She is oriented to person, place, and time. She appears well-developed and well-nourished.  Neck: Neck supple. No thyromegaly present.  Cardiovascular: Normal rate, regular rhythm and normal heart sounds.  No murmur heard. Pulmonary/Chest: Effort normal and breath sounds normal. No respiratory distress. She has no wheezes.  Musculoskeletal: She exhibits no edema.  Lymphadenopathy:    She has no cervical adenopathy.  Neurological: She is alert and oriented to person, place, and time.  Psychiatric: She has a normal mood and affect. Her behavior is normal. Judgment and thought content normal.          Assessment & Plan:  Allergic rhinitis- continue current meds- following with allergist.  HTN- BP is elevated here.  Reports much better at work. She will have her work reading faxed to [place] for review.   Hyperglycemia- pt is requesting A1C. Insulin levels.   GERD- fair control, continue PPI. Discussed dietary changes for GERD.   Rectal bleed- one episode, due for colo, refer to GI, check baseline cbc.

## 2017-12-22 NOTE — Patient Instructions (Signed)
Please continue your current blood pressure medications. Have your blood pressure checked at work next week and call us with your reading  (or you can send me your reading via mychart).

## 2017-12-23 ENCOUNTER — Telehealth: Payer: Self-pay | Admitting: Family

## 2017-12-23 ENCOUNTER — Encounter: Payer: Self-pay | Admitting: Family

## 2017-12-23 ENCOUNTER — Encounter (HOSPITAL_BASED_OUTPATIENT_CLINIC_OR_DEPARTMENT_OTHER): Payer: Self-pay | Admitting: *Deleted

## 2017-12-23 ENCOUNTER — Emergency Department (HOSPITAL_BASED_OUTPATIENT_CLINIC_OR_DEPARTMENT_OTHER)
Admission: EM | Admit: 2017-12-23 | Discharge: 2017-12-24 | Disposition: A | Discharge status: Home / Self Care | Payer: 59 | Attending: Emergency Medicine | Admitting: Emergency Medicine

## 2017-12-23 ENCOUNTER — Other Ambulatory Visit: Payer: Self-pay

## 2017-12-23 ENCOUNTER — Emergency Department (HOSPITAL_BASED_OUTPATIENT_CLINIC_OR_DEPARTMENT_OTHER): Payer: 59

## 2017-12-23 DIAGNOSIS — I10 Essential (primary) hypertension: Secondary | ICD-10-CM | POA: Insufficient documentation

## 2017-12-23 DIAGNOSIS — K644 Residual hemorrhoidal skin tags: Secondary | ICD-10-CM | POA: Insufficient documentation

## 2017-12-23 DIAGNOSIS — R7303 Prediabetes: Secondary | ICD-10-CM

## 2017-12-23 DIAGNOSIS — Z91040 Latex allergy status: Secondary | ICD-10-CM | POA: Diagnosis not present

## 2017-12-23 DIAGNOSIS — K5900 Constipation, unspecified: Secondary | ICD-10-CM | POA: Diagnosis not present

## 2017-12-23 DIAGNOSIS — Z79899 Other long term (current) drug therapy: Secondary | ICD-10-CM | POA: Insufficient documentation

## 2017-12-23 DIAGNOSIS — K625 Hemorrhage of anus and rectum: Secondary | ICD-10-CM | POA: Diagnosis present

## 2017-12-23 DIAGNOSIS — D649 Anemia, unspecified: Secondary | ICD-10-CM

## 2017-12-23 HISTORY — DX: Prediabetes: R73.03

## 2017-12-23 LAB — COMPREHENSIVE METABOLIC PANEL
ALT: 16 U/L (ref 14–54)
AST: 18 U/L (ref 15–41)
Albumin: 4.3 g/dL (ref 3.5–5.0)
Alkaline Phosphatase: 71 U/L (ref 38–126)
Anion gap: 9 (ref 5–15)
BUN: 16 mg/dL (ref 6–20)
CO2: 22 mmol/L (ref 22–32)
Calcium: 9 mg/dL (ref 8.9–10.3)
Chloride: 108 mmol/L (ref 101–111)
Creatinine, Ser: 0.71 mg/dL (ref 0.44–1.00)
GFR calc Af Amer: >60 mL/min (ref >60)
GFR calc non Af Amer: >60 mL/min (ref >60)
Glucose, Bld: 137 mg/dL — ABNORMAL HIGH (ref 65–99)
Potassium: 3.3 mmol/L — ABNORMAL LOW (ref 3.5–5.1)
Sodium: 139 mmol/L (ref 135–145)
Total Bilirubin: 0.2 mg/dL — ABNORMAL LOW (ref 0.3–1.2)
Total Protein: 7.5 g/dL (ref 6.5–8.1)

## 2017-12-23 LAB — CBC WITH DIFFERENTIAL/PLATELET
Basophils Absolute: 0.1 10*3/uL (ref 0.0–0.1)
Basophils Relative: 1 % (ref 0.0–3.0)
Eosinophils Absolute: 0.1 10*3/uL (ref 0.0–0.7)
Eosinophils Relative: 0.7 % (ref 0.0–5.0)
HCT: 35.9 % — ABNORMAL LOW (ref 36.0–46.0)
Hemoglobin: 11.6 g/dL — ABNORMAL LOW (ref 12.0–15.0)
Lymphocytes Relative: 31.3 % (ref 12.0–46.0)
Lymphs Abs: 2.9 10*3/uL (ref 0.7–4.0)
MCHC: 32.2 g/dL (ref 30.0–36.0)
MCV: 83 fl (ref 78.0–100.0)
Monocytes Absolute: 0.7 10*3/uL (ref 0.1–1.0)
Monocytes Relative: 7.1 % (ref 3.0–12.0)
Neutro Abs: 5.5 10*3/uL (ref 1.4–7.7)
Neutrophils Relative %: 59.9 % (ref 43.0–77.0)
Platelets: 211 10*3/uL (ref 150.0–400.0)
RBC: 4.33 Mil/uL (ref 3.87–5.11)
RDW: 15.5 % (ref 11.5–15.5)
WBC: 9.2 10*3/uL (ref 4.0–10.5)

## 2017-12-23 LAB — CBC
HCT: 34.3 % — ABNORMAL LOW (ref 36.0–46.0)
Hemoglobin: 10.8 g/dL — ABNORMAL LOW (ref 12.0–15.0)
MCH: 26.1 pg (ref 26.0–34.0)
MCHC: 31.5 g/dL (ref 30.0–36.0)
MCV: 82.9 fL (ref 78.0–100.0)
Platelets: 209 10*3/uL (ref 150–400)
RBC: 4.14 MIL/uL (ref 3.87–5.11)
RDW: 14.7 % (ref 11.5–15.5)
WBC: 9.5 10*3/uL (ref 4.0–10.5)

## 2017-12-23 LAB — PREGNANCY, URINE: Preg Test, Ur: NEGATIVE

## 2017-12-23 LAB — HEMOGLOBIN A1C: Hgb A1c MFr Bld: 6.4 % (ref 4.6–6.5)

## 2017-12-23 NOTE — Telephone Encounter (Signed)
Patient remains mildly anemic. Please ask lab to add on serum iron. Continue MVI with minerals and follow through with GI referral for rectal bleeding. Also, Sugar is in the borderline diabetes range. Please work on avoiding concentrated sweets, and limiting white carbs (rice/bread/pasta/potatoes). Instead substitute whole grain versions with reasonable portions.

## 2017-12-23 NOTE — ED Notes (Signed)
Pt denies blurred vision, lightheadedness, dizziness. Pt in NAD on assessment.

## 2017-12-23 NOTE — Telephone Encounter (Signed)
Add on form faxed to the lab. 

## 2017-12-23 NOTE — ED Provider Notes (Signed)
[facility] DEPARTMENT Provider Note   CSN: [id number] Arrival date & time: 12/23/17  2015     History   Chief Complaint Chief Complaint  Patient presents with  . Rectal Bleeding    HPI HALIEY ROMBERG is a 57 y.o. female.  HPI   57 yo F with PMHx as below here with constipation, bright red blood per rectum. Pt states that she has a lifelong h/o constipation issues with hemorrhoids. Over the last week, she has had dietary indiscretions and has subsequently been constipated. She started drinking prune juice over the weekend and had a painful, straining BM on Saturday. Stool was brown then had streaks of blood. She was fine Sunday but has since had recurrence of blood streaking her stool and on toilet paper. No abd pain. No weight loss or night sweats. No fever, no other complaints. No h/o diverticulosis. Saw by PCP yesterday and referred to GI. No blood thinner use. No CP, SOB, syncope.   Past Medical History:  Diagnosis Date  . Acid reflux   . Allergy   . Arthritis   . Borderline diabetes 12/23/2017  . GERD (gastroesophageal reflux disease)   . H/O seasonal allergies   . Hypertension     Patient Active Problem List   Diagnosis Date Noted  . Borderline diabetes 12/23/2017  . Low back pain 07/31/2017  . GERD (gastroesophageal reflux disease) 07/31/2017  . Seasonal allergies 07/31/2017  . Hypertension 07/31/2017    Past Surgical History:  Procedure Laterality Date  . ANKLE SURGERY    . CHOLECYSTECTOMY    . TONSILLECTOMY    . TUBAL LIGATION       OB History    Gravida  2   Para      Term      Preterm      AB  1   Living  1     SAB      TAB  1   Ectopic      Multiple      Live Births  1            Home Medications    Prior to Admission medications   Medication Sig Start Date End Date Taking? Authorizing Provider  ALPRAZolam Duanne Moron) 0.5 MG tablet Take 1 tablet 30 minutes prior to MRI.  Please have someone drive you to  your appointment 10/28/17   Debbrah Alar, NP  amLODipine (NORVASC) 5 MG tablet Take 1 tablet (5 mg total) by mouth daily. 10/07/17   Colon Branch, MD  cetirizine (ZYRTEC) 10 MG tablet Take 1 tablet (10 mg total) by mouth daily. 09/26/17   Debbrah Alar, NP  cyclobenzaprine (FLEXERIL) 10 MG tablet Take 10 mg as needed by mouth for muscle spasms (back / ankle pain).    [provider]  docusate sodium (COLACE) 250 MG capsule Take 1 capsule (250 mg total) by mouth daily for 14 days. 12/24/17 01/07/18  Duffy Bruce, MD  flunisolide (NASALIDE) 25 MCG/ACT (0.025%) SOLN Place 1 spray into the nose 2 (two) times daily. 09/24/17   Debbrah Alar, NP  hydrocortisone (ANUSOL-HC) 25 MG suppository Place 1 suppository (25 mg total) rectally 2 (two) times daily as needed for hemorrhoids or anal itching. 12/24/17   Duffy Bruce, MD  metoprolol tartrate (LOPRESSOR) 50 MG tablet Take 1 tablet (50 mg total) 2 (two) times daily by mouth. 08/01/17   Debbrah Alar, NP  Multiple Vitamins-Minerals (MULTIVITAMIN WITH MINERALS) tablet Take 1 tablet by mouth daily.  09/24/17   Debbrah Alar, NP  naproxen (NAPROSYN) 500 MG tablet Take 500 mg as needed by mouth.    [provider]  omeprazole (PRILOSEC) 40 MG capsule Take 1 capsule (40 mg total) by mouth daily. 10/13/17   Debbrah Alar, NP  polyethylene glycol [facility]) packet Take 17 g by mouth 2 (two) times daily for 2 days. 12/24/17 12/26/17  Duffy Bruce, MD    Family History Family History  Problem Relation Age of Onset  . Breast cancer Mother   . Cancer Mother   . Breast cancer Maternal Aunt   . Diabetes Maternal Aunt   . Hypertension Maternal Grandmother     Social History Social History   Tobacco Use  . Smoking status: Never Smoker  . Smokeless tobacco: Never Used  Substance Use Topics  . Alcohol use: Yes    Comment: occasional wine  . Drug use: No     Allergies   Penicillins; Cefdinir [cefdinir];  Amoxicillin; Latex; Nickel; and Vicodin [hydrocodone-acetaminophen]   Review of Systems Review of Systems  Constitutional: Negative for chills and fever.  HENT: Negative for congestion, rhinorrhea and sore throat.   Eyes: Negative for visual disturbance.  Respiratory: Negative for cough, shortness of breath and wheezing.   Cardiovascular: Negative for chest pain and leg swelling.  Gastrointestinal: Positive for anal bleeding and constipation. Negative for abdominal pain, diarrhea, nausea and vomiting.  Genitourinary: Negative for dysuria, flank pain, vaginal bleeding and vaginal discharge.  Musculoskeletal: Negative for neck pain.  Skin: Negative for rash.  Allergic/Immunologic: Negative for immunocompromised state.  Neurological: Negative for syncope and headaches.  Hematological: Does not bruise/bleed easily.  All other systems reviewed and are negative.    Physical Exam Updated Vital Signs BP 134/77 (BP Location: Right Arm)   Pulse 72   Temp 98.2 F (36.8 C) (Oral)   Resp 16   Ht 5\' 7"  (1.702 m)   Wt 108.4 kg (239 lb)   SpO2 100%   BMI 37.43 kg/m   Physical Exam  Constitutional: She is oriented to person, place, and time. She appears well-developed and well-nourished. No distress.  HENT:  Head: Normocephalic and atraumatic.  Eyes: Conjunctivae are normal.  Neck: Neck supple.  Cardiovascular: Normal rate, regular rhythm and normal heart sounds. Exam reveals no friction rub.  No murmur heard. Pulmonary/Chest: Effort normal and breath sounds normal. No respiratory distress. She has no wheezes. She has no rales.  Abdominal: Soft. Bowel sounds are normal. She exhibits no distension. There is no tenderness. There is no guarding.  No TTP, specifically no LLQ TTP  Genitourinary:  Genitourinary Comments: Multiple swollen, inflamed, external hemorrhoids. No active bleeding. Normal internal sphincter, rectal vault.   Musculoskeletal: She exhibits no edema.  Neurological: She  is alert and oriented to person, place, and time. She exhibits normal muscle tone.  Skin: Skin is warm. Capillary refill takes less than 2 seconds.  Psychiatric: She has a normal mood and affect.  Nursing note and vitals reviewed.    ED Treatments / Results  Labs (all labs ordered are listed, but only abnormal results are displayed) Labs Reviewed  COMPREHENSIVE METABOLIC PANEL - Abnormal; Notable for the following components:      Result Value   Potassium 3.3 (*)    Glucose, Bld 137 (*)    Total Bilirubin 0.2 (*)    All other components within normal limits  CBC - Abnormal; Notable for the following components:   Hemoglobin 10.8 (*)    HCT 34.3 (*)  All other components within normal limits  PREGNANCY, URINE    EKG None  Radiology Dg Abd 2 Views  Result Date: 12/24/2017 CLINICAL DATA:  Constipation x4 days with hemorrhoids and bright red blood per rectum. EXAM: ABDOMEN - 2 VIEW COMPARISON:  None. FINDINGS: Moderate stool burden within the colon. No bowel obstruction or free air. No organomegaly. Cholecystectomy and tubal ligation clips are present. No acute osseous abnormality. Lower lumbar facet arthropathy at L4-5 and L5-S1. IMPRESSION: Moderate stool burden within the colon.  No bowel obstruction. Electronically Signed   By: Ashley Royalty M.D.   On: 12/24/2017 00:52    Procedures Procedures (including critical care time)  Medications Ordered in ED Medications - No data to display   Initial Impression / Assessment and Plan / ED Course  I have reviewed the triage vital signs and the nursing notes.  Pertinent labs & imaging results that were available during my care of the patient were reviewed by me and considered in my medical decision making (see chart for details).     57 yo F with well documented h/o constipation and hemorrhoids here with rectal bleeding. Hgb stable/at baseline. No blood thinner use. On exam, pt has brown stool but inflamed hemorrhoids with  stigmata of recent bleeding. Suspect acute constipation w/ exacerbation of her hemorrhoids. Labs are otherwise very reassuring. K low - will advise her to replace at home. No arrhythmias. She has a normal WBC, no abdominal TTP, no known h/o diverticulosis and I do not clinically suspect diverticulitis, colitis, or appendicitis. Her labs are o/w reassuring. Will treat with stool softeners, d/c with outpt follow-up. KUB c/w dehydration, no signs fo obstruction. Strict return precautions given.  Final Clinical Impressions(s) / ED Diagnoses   Final diagnoses:  Constipation  External hemorrhoid    ED Discharge Orders        Ordered    docusate sodium (COLACE) 250 MG capsule  Daily     12/24/17 0130    polyethylene glycol (MIRALAX) packet  2 times daily     12/24/17 0130    hydrocortisone (ANUSOL-HC) 25 MG suppository  2 times daily PRN     12/24/17 0130       Duffy Bruce, MD 12/24/17 0225

## 2017-12-23 NOTE — ED Triage Notes (Addendum)
Constipation x 4 days. Bright red rectal bleeding this weekend after constipation. She was seen by her MD yesterday for the bleeding and was scheduled for a colonoscopy. She was not examined for hemorrhoids. She saw bright red bleeding again today after drinking prune juice and having 2 BM's.

## 2017-12-24 ENCOUNTER — Other Ambulatory Visit: Payer: Self-pay | Admitting: Family

## 2017-12-24 ENCOUNTER — Encounter: Payer: Self-pay | Admitting: Nurse Practitioner

## 2017-12-24 ENCOUNTER — Telehealth: Payer: Self-pay | Admitting: Family

## 2017-12-24 DIAGNOSIS — E876 Hypokalemia: Secondary | ICD-10-CM

## 2017-12-24 MED ORDER — DOCUSATE SODIUM 250 MG PO CAPS: 250.0000 mg | ORAL_CAPSULE | Freq: Every day | ORAL | 0 refills | Status: AC

## 2017-12-24 MED ORDER — POLYETHYLENE GLYCOL 3350 17 G PO PACK: 17.0000 g | PACK | Freq: Two times a day (BID) | ORAL | 0 refills | Status: AC

## 2017-12-24 MED ORDER — HYDROCORTISONE ACETATE 25 MG RE SUPP: 25.0000 mg | Freq: Two times a day (BID) | RECTAL | 0 refills | Status: DC | PRN

## 2017-12-24 NOTE — Telephone Encounter (Signed)
Lab is unable to add Iron. Will obtain at lab appt already scheduled in 1 week. Future order entered.

## 2017-12-24 NOTE — Telephone Encounter (Signed)
Reviewed ED record. K+ was low. Advise pt to work on eating high potassium foods and repeat bmet in 1 week, dx hypokalemia.   High potassium content foods  Highest content (>25 meq/100 g) High content (>6.2 meq/100 g)   Vegetables   Spinach   Tomatoes   Broccoli   Winter squash   Beets   Carrots   Cauliflower   Potatoes   Fruits   Bananas  Dried figs Cantaloupe  Molasses Kiwis  Seaweed Oranges  Very high content (>12.5 meq/100 g) Mangos  Dried fruits (dates, prunes) Meats  Nuts Ground beef  Avocados Steak  Bran cereals Pork  Wheat germ Veal  Lima beans Arline Asp

## 2017-12-24 NOTE — Telephone Encounter (Signed)
Notified pt of below.

## 2017-12-24 NOTE — Addendum Note (Signed)
Addended by: Kelle Darting A on: 12/24/2017 05:28 PM   Modules accepted: Orders

## 2017-12-24 NOTE — Telephone Encounter (Signed)
Notified pt of below and scheduled lab appt for 12/31/17 at 7:10am. Future order entered.

## 2017-12-31 ENCOUNTER — Other Ambulatory Visit (INDEPENDENT_AMBULATORY_CARE_PROVIDER_SITE_OTHER): Payer: 59

## 2017-12-31 DIAGNOSIS — D649 Anemia, unspecified: Secondary | ICD-10-CM | POA: Diagnosis not present

## 2017-12-31 DIAGNOSIS — E876 Hypokalemia: Secondary | ICD-10-CM | POA: Diagnosis not present

## 2017-12-31 LAB — IRON: Iron: 42 ug/dL (ref 42–145)

## 2017-12-31 LAB — BASIC METABOLIC PANEL
BUN: 19 mg/dL (ref 6–23)
CO2: 25 mEq/L (ref 19–32)
Calcium: 9.3 mg/dL (ref 8.4–10.5)
Chloride: 107 mEq/L (ref 96–112)
Creatinine, Ser: 0.88 mg/dL (ref 0.40–1.20)
GFR: 85.34 mL/min (ref >60.00)
Glucose, Bld: 100 mg/dL — ABNORMAL HIGH (ref 70–99)
Potassium: 3.8 mEq/L (ref 3.5–5.1)
Sodium: 142 mEq/L (ref 135–145)

## 2018-01-12 ENCOUNTER — Encounter: Payer: Self-pay | Admitting: Nurse Practitioner

## 2018-01-12 ENCOUNTER — Encounter: Payer: Self-pay | Admitting: Gastroenterology

## 2018-01-12 ENCOUNTER — Ambulatory Visit: Payer: 59 | Admitting: Nurse Practitioner

## 2018-01-12 VITALS — BP 124/78 | HR 84 | Ht 67.0 in | Wt 236.0 lb

## 2018-01-12 DIAGNOSIS — K219 Gastro-esophageal reflux disease without esophagitis: Secondary | ICD-10-CM

## 2018-01-12 DIAGNOSIS — K625 Hemorrhage of anus and rectum: Secondary | ICD-10-CM | POA: Diagnosis not present

## 2018-01-12 DIAGNOSIS — Z86010 Personal history of colonic polyps: Secondary | ICD-10-CM

## 2018-01-12 MED ORDER — RANITIDINE HCL 150 MG PO TABS: 150.0000 mg | ORAL_TABLET | ORAL | 3 refills | Status: DC

## 2018-01-12 MED ORDER — NA SULFATE-K SULFATE-MG SULF 17.5-3.13-1.6 GM/177ML PO SOLN: ORAL | 0 refills | Status: DC

## 2018-01-12 NOTE — Progress Notes (Addendum)
Chief Complaint: Rectal bleeding  Referring Provider: Debbrah Alar  , NP    ASSESSMENT AND PLAN;   67.  57 year old female with several days of rectal bleeding associated with constipation earlier this month.  Evaluated in ED,  hgb down about 1 g since January from 11.8-10.8.  KUB showed moderate stool throughout the colon. Advised to take Miralax for a couple of days and was able to purge bowels. The bleeding is painless.  She has some bloating, lower abdominal discomfort and perianal irritation from hemorrhoids but these are more chronic in nature.   -Patient has a history of colon polyps (2 colonoscopies in [place]). She was apparently due for 5-year surveillance colonoscopy last year but didn't have insurance last year to get procedure done.  We will schedule patient for colonoscopy in light of rectal bleeding,  anemia, and reported polyp history but records from [facility] will be requested.  The risks and benefits of colonoscopy with possible polypectomy were discussed and the patient agrees to proceed.  -She may be a candidate for internal hemorrhoid banding if doesn't respond to steroid cream -treat constipation. Patient did not realize that she could take MiraLAX on a daily basis as needed -Discussed importance of good hydration -Avoid straining -For recurrent bleeding she will use he Anusol suppositories for 5 to 7 days.  2. Long-standing GERD.  Reports EGD in [facility] several years ago.  Records requested.  Patient would like to come off omeprazole which she has taken for years. -She can try Zantac 150 mg daily and discontinue the omeprazole. -We discussed antireflux measures.  She cannot go to bed on an empty stomach as it interferes with her ability to sleep -She cannot use a wedge pillow because of shoulder problems but does have a mattress which can be elevated -Written GERD literature given -Weight loss may help reflux, she has recently started exercising  again  Addendum for 01/13/18.  Records received from [facility].    EGD by Dr. Alonza Bogus done 01/24/2012.  Exam done for evaluation of chronic heartburn.  Esophagus was normal.  There was a 1 to 2 cm hiatal hernia.  A 4 mm polyp was noted at the incisura.  This was biopsied.  Duodenal normal.  Gastric nodule biopsy showed nonspecific chronic inflammation.  H. pylori negative.  Esophageal biopsy unremarkable.  Colonoscopy same day.  8 cm of the terminal ileum were intubated and normal.  The preparation was good.  Normal colon.   HPI:    Patient is a 57 year old female, new to this practice, referred by PCP for evaluation of rectal bleeding.  She had some minor rectal bleeding over the summer. 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 Earlier this month patient became constipated while taking a multiple vitamin and a stool softener. She had associated abdominal cramps as well as recurrent rectal bleeding. Evaluated in ED,  hemoglobin was down about 1 g from 11.8-10.8 since January.  EDP advised her to take a couple of days of MiraLAX which she did with good results. She was given suppositories for the hemorrhoids and has not had rectal bleeding in several days.  Currently her bowel movements are normal but she is drinking prune juice.   Patient gives long-standing history of GERD.  She apparently had an EGD 6 years ago in [facility].  She has been on omeprazole for years and would like to come off from it.  She is unable to use a wedge pillow but her mattress has the ability to be raised.  She is tried to go to bed on an empty stomach but is unable to sleep in a hungry state.  He is working on weight loss.  Patient has frequent bloating associated with diffuse lower abdominal discomfort.  She gives a history of IBS.   Past Medical History:  Diagnosis Date  . Allergy   . Arthritis    back and ankle  . Borderline diabetes 12/23/2017  .  GERD (gastroesophageal reflux disease)   . H/O seasonal allergies   . Hiatal hernia   . Hypertension     Past Surgical History:  Procedure Laterality Date  . ANKLE SURGERY Left   . CHOLECYSTECTOMY    . TONSILLECTOMY    . TUBAL LIGATION     Family History  Problem Relation Age of Onset  . Breast cancer Mother   . Breast cancer Maternal Aunt   . Diabetes Maternal Aunt   . Hypertension Maternal Grandmother   . Colon cancer Neg Hx   . Rectal cancer Neg Hx   . Esophageal cancer Neg Hx    Social History   Tobacco Use  . Smoking status: Never Smoker  . Smokeless tobacco: Never Used  Substance Use Topics  . Alcohol use: Yes    Comment: occasional wine  . Drug use: No   Current Outpatient Medications  Medication Sig Dispense Refill  . amLODipine (NORVASC) 5 MG tablet TAKE 1 TABLET BY MOUTH EVERY DAY 30 tablet 3  . cyclobenzaprine (FLEXERIL) 10 MG tablet Take 10 mg as needed by mouth for muscle spasms (back / ankle pain).    . flunisolide (NASALIDE) 25 MCG/ACT (0.025%) SOLN Place 1 spray into the nose 2 (two) times daily. 1 Bottle 3  . hydrocortisone (ANUSOL-HC) 25 MG suppository Place 1 suppository (25 mg total) rectally 2 (two) times daily as needed for hemorrhoids or anal itching. 12 suppository 0  . metoprolol tartrate (LOPRESSOR) 50 MG tablet Take 1 tablet (50 mg total) 2 (two) times daily by mouth. 180 tablet 1  . naproxen (NAPROSYN) 500 MG tablet Take 500 mg as needed by mouth.    Marland Kitchen omeprazole (PRILOSEC) 40 MG capsule Take 1 capsule (40 mg total) by mouth daily. 90 capsule 1   No current facility-administered medications for this visit.    Allergies  Allergen Reactions  . Penicillins Anaphylaxis and Rash  . Cefdinir [Cefdinir] Swelling  . Amoxicillin Rash  . Latex Rash  . Nickel Hives and Rash  . Vicodin [Hydrocodone-Acetaminophen] Rash     Review of Systems: All systems reviewed and negative except where noted in HPI.     Physical Exam:    Wt Readings from  Last 3 Encounters:  01/12/18 236 lb (107 kg)  12/23/17 239 lb (108.4 kg)  12/22/17 239 lb 3.2 oz (108.5 kg)    BP 124/78   Pulse 84   Ht 5\' 7"  (1.702 m)   Wt 236 lb (107 kg)   BMI 36.96 kg/m  Constitutional:  Well-developed, female in no acute distress. Psychiatric: Normal mood and affect. Behavior is normal. EENT: Pupils normal.  Conjunctivae are normal. No scleral icterus. Neck supple.  Cardiovascular: Normal rate, regular rhythm. No edema Pulmonary/chest: Effort normal and breath sounds normal. No wheezing, rales or rhonchi. Abdominal: Soft, nondistended. Nontender. Bowel sounds active throughout. There are no masses palpable. No hepatomegaly. Neurological: Alert and oriented to person place and time. Skin: Skin is  warm and dry. No rashes noted.  Tye Savoy, NP  01/12/2018, 9:22 AM  Cc:  Debbrah Alar, NP

## 2018-01-12 NOTE — Patient Instructions (Addendum)
If you are age 57 or older, your body mass index should be between 23-30. Your Body mass index is 36.96 kg/m. If this is out of the aforementioned range listed, please consider follow up with your Primary Care Provider.  If you are age 52 or younger, your body mass index should be between 19-25. Your Body mass index is 36.96 kg/m. If this is out of the aformentioned range listed, please consider follow up with your Primary Care Provider.   You have been scheduled for a colonoscopy. Please follow written instructions given to you at your visit today.  Please pick up your prep supplies at the pharmacy within the next 1-3 days. If you use inhalers (even only as needed), please bring them with you on the day of your procedure. Your physician has requested that you go to www.startemmi.com and enter the access code given to you at your visit today. This web site gives a general overview about your procedure. However, you should still follow specific instructions given to you by our office regarding your preparation for the procedure.  We have sent the following medications to your pharmacy for you to pick up at your convenience: Suprep Zantac 150 mg  If you try Zantac, STOP Omeprazole.  Can take Miralax 1 capful daily as needed for constipation.   You have been given GERD Literature.  Thank you for choosing me and [facility].   Tye Savoy, NP

## 2018-01-13 ENCOUNTER — Encounter: Payer: Self-pay | Admitting: Nurse Practitioner

## 2018-01-14 NOTE — Progress Notes (Signed)
Thank you for sending this case to me. I have reviewed the entire note, and the outlined plan seems appropriate.   Henry Danis, MD  

## 2018-01-15 ENCOUNTER — Telehealth: Payer: Self-pay | Admitting: Gastroenterology

## 2018-01-16 NOTE — Telephone Encounter (Signed)
Called patient regarding Suprep.  We do not have samples at this time, but expecting a shipment next week. Patient advised to call office on Wednesday to see if prep has come in.  In the meantime, a coupon has been mailed to patient to pay no more than $50.

## 2018-01-19 NOTE — Telephone Encounter (Signed)
Patient calling back in regards of this wants to know why she can not do the miralax prep instead states her Aunt had a colonoscopy with [place] last week and did Miralax prep.

## 2018-01-19 NOTE — Telephone Encounter (Signed)
Spoke with patient regarding Suprep vs. Miralax.  I explained that certain physicians prefer certain preps for their procedure.  I told her Suprep samples should be in on Wednesday.  She is going to call at that time to see if they have arrived and will pick it up on Friday.

## 2018-01-26 ENCOUNTER — Encounter: Payer: Self-pay | Admitting: Gastroenterology

## 2018-01-26 ENCOUNTER — Ambulatory Visit (AMBULATORY_SURGERY_CENTER): Payer: 59 | Admitting: Gastroenterology

## 2018-01-26 ENCOUNTER — Other Ambulatory Visit: Payer: Self-pay

## 2018-01-26 VITALS — BP 134/72 | HR 76 | Temp 98.4°F | Resp 14 | Ht 67.0 in | Wt 236.0 lb

## 2018-01-26 DIAGNOSIS — K625 Hemorrhage of anus and rectum: Secondary | ICD-10-CM

## 2018-01-26 MED ORDER — SODIUM CHLORIDE 0.9 % IV SOLN
500.0000 mL | Freq: Once | INTRAVENOUS | Status: DC
Start: 2018-01-26 — End: 2021-03-27

## 2018-01-26 NOTE — Progress Notes (Signed)
Report given to PACU, vss 

## 2018-01-26 NOTE — Patient Instructions (Signed)
YOU HAD AN ENDOSCOPIC PROCEDURE TODAY AT [facility]:   Refer to the procedure report that was given to you for any specific questions about what was found during the examination.  If the procedure report does not answer your questions, please call your gastroenterologist to clarify.  If you requested that your care partner not be given the details of your procedure findings, then the procedure report has been included in a sealed envelope for you to review at your convenience later.  YOU SHOULD EXPECT: Some feelings of bloating in the abdomen. Passage of more gas than usual.  Walking can help get rid of the air that was put into your GI tract during the procedure and reduce the bloating. If you had a lower endoscopy (such as a colonoscopy or flexible sigmoidoscopy) you may notice spotting of blood in your stool or on the toilet paper. If you underwent a bowel prep for your procedure, you may not have a normal bowel movement for a few days.  Please Note:  You might notice some irritation and congestion in your nose or some drainage.  This is from the oxygen used during your procedure.  There is no need for concern and it should clear up in a day or so.  SYMPTOMS TO REPORT IMMEDIATELY:   Following lower endoscopy (colonoscopy or flexible sigmoidoscopy):  Excessive amounts of blood in the stool  Significant tenderness or worsening of abdominal pains  Swelling of the abdomen that is new, acute  Fever of 100F or higher    For urgent or emergent issues, a gastroenterologist can be reached at any hour by calling [phone].   DIET:  We do recommend a small meal at first, but then you may proceed to your regular diet.  Drink plenty of fluids but you should avoid alcoholic beverages for 24 hours.  ACTIVITY:  You should plan to take it easy for the rest of today and you should NOT DRIVE or use heavy machinery until tomorrow (because of the sedation medicines used during the test).     FOLLOW UP: Our staff will call the number listed on your records the next business day following your procedure to check on you and address any questions or concerns that you may have regarding the information given to you following your procedure. If we do not reach you, we will leave a message.  However, if you are feeling well and you are not experiencing any problems, there is no need to return our call.  We will assume that you have returned to your regular daily activities without incident.  If any biopsies were taken you will be contacted by phone or by letter within the next 1-3 weeks.  Please call us at [phone] if you have not heard about the biopsies in 3 weeks.    SIGNATURES/CONFIDENTIALITY: You and/or your care partner have signed paperwork which will be entered into your electronic medical record.  These signatures attest to the fact that that the information above on your After Visit Summary has been reviewed and is understood.  Full responsibility of the confidentiality of this discharge information lies with you and/or your care-partner.    Handout was given to your care partner on Diverticulosis. You may resume your current medications today. Repeat screening colonoscopy in 10 years. Please call if any questions or concerns.

## 2018-01-26 NOTE — Op Note (Signed)
[facility] Patient Name: Felicia Chase Procedure Date: 01/26/2018 2:15 PM MRN: [id number] Endoscopist: Mallie Mussel L. Loletha Carrow , MD Age: 57 Referring MD:  Date of Birth: 1961/02/10 Gender: Female Account #: [id number] Procedure:                Colonoscopy Indications:              Rectal bleeding Medicines:                Monitored Anesthesia Care Procedure:                Pre-Anesthesia Assessment:                           - Prior to the procedure, a History and Physical                            was performed, and patient medications and                            allergies were reviewed. The patient's tolerance of                            previous anesthesia was also reviewed. The risks                            and benefits of the procedure and the sedation                            options and risks were discussed with the patient.                            All questions were answered, and informed consent                            was obtained. Prior Anticoagulants: The patient has                            taken no previous anticoagulant or antiplatelet                            agents. ASA Grade Assessment: II - A patient with                            mild systemic disease. After reviewing the risks                            and benefits, the patient was deemed in                            satisfactory condition to undergo the procedure.                           After obtaining informed consent, the colonoscope  was passed under direct vision. Throughout the                            procedure, the patient's blood pressure, pulse, and                            oxygen saturations were monitored continuously. The                            Colonoscope was introduced through the anus and                            advanced to the the cecum, identified by                            appendiceal orifice and ileocecal valve. The                         colonoscopy was performed without difficulty. The                            patient tolerated the procedure well. The quality                            of the bowel preparation was good. The ileocecal                            valve, appendiceal orifice, and rectum were                            photographed. The quality of the bowel preparation                            was evaluated using the BBPS [facility] Bowel                            Preparation Scale) with scores of: Right Colon = 2,                            Transverse Colon = 2 and Left Colon = 2. The total                            BBPS score equals 6. Scope In: 2:19:38 PM Scope Out: 2:34:21 PM Scope Withdrawal Time: 0 hours 9 minutes 52 seconds  Total Procedure Duration: 0 hours 14 minutes 43 seconds  Findings:                 The perianal and digital rectal examinations were                            normal.                           A few small-mouthed diverticula were found in the  sigmoid colon.                           The exam was otherwise without abnormality on                            direct and retroflexion views. Complications:            No immediate complications. Estimated Blood Loss:     Estimated blood loss: none. Impression:               - Diverticulosis in the sigmoid colon.                           - The examination was otherwise normal on direct                            and retroflexion views.                           - No specimens collected.                           Benign ano-rectal bleeding from constipation. Recommendation:           - Patient has a contact number available for                            emergencies. The signs and symptoms of potential                            delayed complications were discussed with the                            patient. Return to normal activities tomorrow.                            Written  discharge instructions were provided to the                            patient.                           - Resume previous diet.                           - Continue present medications, including miralax                            for constipation.                           - Repeat colonoscopy in 10 years for screening                            purposes. Chrisanne Loose L. Loletha Carrow, MD 01/26/2018 2:39:49 PM This report has been signed electronically.

## 2018-01-26 NOTE — Progress Notes (Signed)
No problems noted in the recovery room. maw 

## 2018-01-27 ENCOUNTER — Telehealth: Payer: Self-pay | Admitting: *Deleted

## 2018-01-27 NOTE — Telephone Encounter (Signed)
  Follow up Call-  Call back number 01/26/2018  Post procedure Call Back phone  # [phone]  Permission to leave phone message Yes  Some recent data might be hidden     Patient questions:  Do you have a fever, pain , or abdominal swelling? No. Pain Score  0 *  Have you tolerated food without any problems? Yes.    Have you been able to return to your normal activities? Yes.    Do you have any questions about your discharge instructions: Diet   No. Medications  No. Follow up visit  No.  Do you have questions or concerns about your Care? No.  Actions: * If pain score is 4 or above: No action needed, pain <4.

## 2018-02-07 ENCOUNTER — Ambulatory Visit (HOSPITAL_BASED_OUTPATIENT_CLINIC_OR_DEPARTMENT_OTHER)
Admission: RE | Admit: 2018-02-07 | Discharge: 2018-02-07 | Disposition: A | Discharge status: Home / Self Care | Payer: 59 | Source: Ambulatory Visit | Attending: Family | Admitting: Family

## 2018-02-07 ENCOUNTER — Ambulatory Visit (HOSPITAL_BASED_OUTPATIENT_CLINIC_OR_DEPARTMENT_OTHER): Payer: 59

## 2018-02-07 DIAGNOSIS — Z1231 Encounter for screening mammogram for malignant neoplasm of breast: Secondary | ICD-10-CM | POA: Insufficient documentation

## 2018-02-07 DIAGNOSIS — Z1239 Encounter for other screening for malignant neoplasm of breast: Secondary | ICD-10-CM

## 2018-03-04 ENCOUNTER — Other Ambulatory Visit: Payer: Self-pay | Admitting: Family

## 2018-03-04 ENCOUNTER — Telehealth: Payer: Self-pay | Admitting: Family

## 2018-03-04 NOTE — Telephone Encounter (Signed)
Copied from [place] [id number]. Topic: Quick Communication - Rx Refill/Question >> Mar 04, 2018  4:46 PM Mcneil, Ja-Kwan wrote: Medication: metoprolol tartrate (LOPRESSOR) 50 MG tablet  Has the patient contacted their pharmacy? yes   Preferred Pharmacy (with phone number or street name): [facility], [place] - [address] [phone] (Phone) [phone] (Fax)   Agent: Please be advised that RX refills may take up to 3 business days. We ask that you follow-up with your pharmacy.

## 2018-03-05 ENCOUNTER — Other Ambulatory Visit: Payer: Self-pay

## 2018-03-05 MED ORDER — METOPROLOL TARTRATE 50 MG PO TABS: 50.0000 mg | ORAL_TABLET | Freq: Two times a day (BID) | ORAL | 1 refills | Status: DC

## 2018-03-23 ENCOUNTER — Ambulatory Visit: Payer: 59 | Admitting: Family

## 2018-03-25 ENCOUNTER — Other Ambulatory Visit: Payer: Self-pay | Admitting: Family

## 2018-03-30 ENCOUNTER — Telehealth: Payer: Self-pay | Admitting: Family

## 2018-03-30 NOTE — Telephone Encounter (Signed)
Spoke with pt. She is requesting appt after 5 today for ? Female issue but pt is also due for routine follow up. PCP is full and no other PCP in our clinic has availability after 5pm. Pt requests early am appt with PCP. Scheduled pt for Wed at 7:20am and will call pt if we have any cancellations this evening.

## 2018-03-30 NOTE — Telephone Encounter (Signed)
Noted  

## 2018-03-30 NOTE — Telephone Encounter (Signed)
Routed to [place], [place] to review; pt requesting appointment today if possible.

## 2018-03-30 NOTE — Telephone Encounter (Signed)
Copied from [place] [id number]. Topic: Quick Communication - See Telephone Encounter >> Mar 30, 2018  9:45 AM Percell Belt A wrote: CRM for notification. See Telephone encounter for: 03/30/18.  Pt called in and stated that she would like someone to call her if Debbrah Alar has anyone cancel after 5?  She would like to get come in today, she is also requesting a call back from [place].  She has some questions she wanted to ask her.  She would not give me that info  Best number  [phone]

## 2018-04-01 ENCOUNTER — Other Ambulatory Visit (HOSPITAL_COMMUNITY)
Admission: RE | Admit: 2018-04-01 | Discharge: 2018-04-01 | Disposition: A | Discharge status: Home / Self Care | Payer: 59 | Source: Ambulatory Visit | Attending: Family | Admitting: Family

## 2018-04-01 ENCOUNTER — Ambulatory Visit: Payer: 59 | Admitting: Family

## 2018-04-01 ENCOUNTER — Encounter: Payer: Self-pay | Admitting: Family

## 2018-04-01 ENCOUNTER — Telehealth: Payer: Self-pay | Admitting: Family

## 2018-04-01 VITALS — BP 122/69 | HR 81 | Temp 98.2°F | Resp 18 | Ht 67.0 in | Wt 242.2 lb

## 2018-04-01 DIAGNOSIS — E785 Hyperlipidemia, unspecified: Secondary | ICD-10-CM | POA: Diagnosis not present

## 2018-04-01 DIAGNOSIS — L309 Dermatitis, unspecified: Secondary | ICD-10-CM

## 2018-04-01 DIAGNOSIS — Z9109 Other allergy status, other than to drugs and biological substances: Secondary | ICD-10-CM | POA: Diagnosis not present

## 2018-04-01 DIAGNOSIS — R739 Hyperglycemia, unspecified: Secondary | ICD-10-CM | POA: Diagnosis not present

## 2018-04-01 DIAGNOSIS — N760 Acute vaginitis: Secondary | ICD-10-CM

## 2018-04-01 DIAGNOSIS — I10 Essential (primary) hypertension: Secondary | ICD-10-CM

## 2018-04-01 DIAGNOSIS — D649 Anemia, unspecified: Secondary | ICD-10-CM

## 2018-04-01 LAB — LIPID PANEL
Cholesterol: 177 mg/dL (ref 0–200)
HDL: 44 mg/dL (ref >39.00)
LDL Cholesterol: 118 mg/dL — ABNORMAL HIGH (ref 0–99)
NonHDL: 133.49
Total CHOL/HDL Ratio: 4
Triglycerides: 78 mg/dL (ref 0.0–149.0)
VLDL: 15.6 mg/dL (ref 0.0–40.0)

## 2018-04-01 LAB — BASIC METABOLIC PANEL
BUN: 16 mg/dL (ref 6–23)
CO2: 27 mEq/L (ref 19–32)
Calcium: 9.2 mg/dL (ref 8.4–10.5)
Chloride: 107 mEq/L (ref 96–112)
Creatinine, Ser: 0.86 mg/dL (ref 0.40–1.20)
GFR: 87.55 mL/min (ref >60.00)
Glucose, Bld: 105 mg/dL — ABNORMAL HIGH (ref 70–99)
Potassium: 4.3 mEq/L (ref 3.5–5.1)
Sodium: 140 mEq/L (ref 135–145)

## 2018-04-01 LAB — HEMOGLOBIN A1C: Hgb A1c MFr Bld: 6.4 % (ref 4.6–6.5)

## 2018-04-01 MED ORDER — BETAMETHASONE DIPROPIONATE 0.05 % EX CREA: TOPICAL_CREAM | Freq: Two times a day (BID) | CUTANEOUS | 0 refills | Status: DC

## 2018-04-01 NOTE — Patient Instructions (Signed)
Please complete lab work prior to leaving. You should be contacted about your referral to the allergist. You may use diprolene cream as needed for rash.

## 2018-04-01 NOTE — Progress Notes (Signed)
Subjective:    Patient ID: Felicia Chase, female    DOB: 08-18-1961, 57 y.o.   MRN: [id number]  HPI  HTN- maintained on amlodipine 5mg  and lopressor.  BP Readings from Last 3 Encounters:  04/01/18 122/69  01/26/18 134/72  01/12/18 124/78   Hyperglycemia-  Diet controlled. Lab Results  Component Value Date   HGBA1C 6.4 12/22/2017   Vaginitis- notes vaginal irritation x 3-4 weeks/+ odor.  Reports not sexually active.    Review of Systems See HPI  Past Medical History:  Diagnosis Date  . Allergy   . Arthritis    back and ankle  . Borderline diabetes 12/23/2017  . GERD (gastroesophageal reflux disease)   . H/O seasonal allergies   . Hiatal hernia   . Hypertension      Social History   Socioeconomic History  . Marital status: Single    Spouse name: Not on file  . Number of children: 1  . Years of education: Not on file  . Highest education level: Not on file  Occupational History  . Occupation: [employer]  . Financial resource strain: Not on file  . Food insecurity:    Worry: Not on file    Inability: Not on file  . Transportation needs:    Medical: Not on file    Non-medical: Not on file  Tobacco Use  . Smoking status: Never Smoker  . Smokeless tobacco: Never Used  Substance and Sexual Activity  . Alcohol use: Yes    Comment: occasional wine  . Drug use: No  . Sexual activity: Never    Birth control/protection: Surgical  Lifestyle  . Physical activity:    Days per week: Not on file    Minutes per session: Not on file  . Stress: Not on file  Relationships  . Social connections:    Talks on phone: Not on file    Gets together: Not on file    Attends religious service: Not on file    Active member of club or organization: Not on file    Attends meetings of clubs or organizations: Not on file    Relationship status: Not on file  . Intimate partner violence:    Fear of current or ex partner: Not on file    Emotionally abused: Not on  file    Physically abused: Not on file    Forced sexual activity: Not on file  Other Topics Concern  . Not on file  Social History Narrative  . Not on file    Past Surgical History:  Procedure Laterality Date  . ANKLE SURGERY Left   . CHOLECYSTECTOMY    . TONSILLECTOMY    . TUBAL LIGATION      Family History  Problem Relation Age of Onset  . Breast cancer Mother   . Breast cancer Maternal Aunt   . Diabetes Maternal Aunt   . Hypertension Maternal Grandmother   . Colon cancer Neg Hx   . Rectal cancer Neg Hx   . Esophageal cancer Neg Hx   . Stomach cancer Neg Hx     Allergies  Allergen Reactions  . Penicillins Anaphylaxis and Rash  . Cefdinir [Cefdinir] Swelling  . Amoxicillin Rash  . Latex Rash  . Nickel Hives and Rash  . Vicodin [Hydrocodone-Acetaminophen] Rash    Current Outpatient Medications on File Prior to Visit  Medication Sig Dispense Refill  . amLODipine (NORVASC) 5 MG tablet TAKE 1 TABLET BY MOUTH EVERY  DAY 30 tablet 3  . cyclobenzaprine (FLEXERIL) 10 MG tablet Take 10 mg as needed by mouth for muscle spasms (back / ankle pain).    . flunisolide (NASALIDE) 25 MCG/ACT (0.025%) SOLN Place 1 spray into the nose 2 (two) times daily. 1 Bottle 3  . hydrocortisone (ANUSOL-HC) 25 MG suppository Place 1 suppository (25 mg total) rectally 2 (two) times daily as needed for hemorrhoids or anal itching. 12 suppository 0  . metoprolol tartrate (LOPRESSOR) 50 MG tablet Take 1 tablet (50 mg total) by mouth 2 (two) times daily. 180 tablet 1  . naproxen (NAPROSYN) 500 MG tablet Take 500 mg as needed by mouth.    Marland Kitchen omeprazole (PRILOSEC) 40 MG capsule TAKE 1 CAPSULE BY MOUTH EVERY DAY 90 capsule 1   Current Facility-Administered Medications on File Prior to Visit  Medication Dose Route Frequency Provider Last Rate Last Dose  . 0.9 %  sodium chloride infusion  500 mL Intravenous Once Danis, Estill Cotta III, MD        BP 122/69 (BP Location: Right Arm, Cuff Size: Large)   Pulse  81   Temp 98.2 F (36.8 C) (Oral)   Resp 18   Ht 5\' 7"  (1.702 m)   Wt 242 lb 3.2 oz (109.9 kg)   SpO2 99%   BMI 37.93 kg/m       Objective:   Physical Exam  Constitutional: She is oriented to person, place, and time. She appears well-developed and well-nourished.  Cardiovascular: Normal rate, regular rhythm and normal heart sounds.  No murmur heard. Pulmonary/Chest: Effort normal and breath sounds normal. No respiratory distress. She has no wheezes.  Neurological: She is alert and oriented to person, place, and time.  Skin: Skin is warm and dry.  Some mild eczema noted on bilateral forearms  Psychiatric: She has a normal mood and affect. Her behavior is normal. Judgment and thought content normal.  GYN: normal external vulvar exam        Assessment & Plan:  Hypertension- bp stable on current meds, continue same.  Hyperglycemia- obtain follow up A1C  Vaginitis- swab obtained- will send for BV and yeast  Environmental allergies- requests referral for second opinion with allergist will arrange.   Eczema- rx provided for betamethasone

## 2018-04-01 NOTE — Addendum Note (Signed)
Addended by: Harl Bowie on: 04/01/2018 01:08 PM   Modules accepted: Orders

## 2018-04-01 NOTE — Telephone Encounter (Signed)
Records show she never completed IFOB. Can we please re-order?

## 2018-04-02 LAB — CERVICOVAGINAL ANCILLARY ONLY
Bacterial vaginitis: NEGATIVE
Candida vaginitis: NEGATIVE

## 2018-04-02 NOTE — Telephone Encounter (Signed)
Pt still has kit and will complete and send to lab. Order has been replaced.

## 2018-04-03 ENCOUNTER — Telehealth: Payer: Self-pay | Admitting: Family

## 2018-04-03 MED ORDER — METRONIDAZOLE 0.75 % VA GEL: 1.0000 | Freq: Every day | VAGINAL | 0 refills | Status: AC

## 2018-04-03 MED ORDER — FLUCONAZOLE 150 MG PO TABS: 150.0000 mg | ORAL_TABLET | Freq: Once | ORAL | 0 refills | Status: AC

## 2018-04-03 NOTE — Telephone Encounter (Signed)
I sent rx for diflucan and metrogel for her vaginitis symptoms. If symptoms fail to improve please let me know and I will refer to GYN.

## 2018-04-06 NOTE — Telephone Encounter (Signed)
Notified pt and she voices understanding. 

## 2018-04-22 ENCOUNTER — Other Ambulatory Visit: Payer: Self-pay | Admitting: Family

## 2018-04-30 ENCOUNTER — Telehealth: Payer: Self-pay | Admitting: Family

## 2018-04-30 NOTE — Telephone Encounter (Signed)
Please advise 

## 2018-04-30 NOTE — Telephone Encounter (Signed)
Copied from [place] [id number]. Topic: Quick Communication - Rx Refill/Question >> Apr 30, 2018 12:46 PM Sheran Luz wrote: Medication: cyclobenzaprine (FLEXERIL) 10 MG tablet  Pt would like to know if she could get this refilled before her next appointment.  Preferred Pharmacy (with phone number or street name): [facility], [place] - [address] [phone] (Phone) [phone] (Fax)    Agent: Please be advised that RX refills may take up to 3 business days. We ask that you follow-up with your pharmacy.

## 2018-05-01 NOTE — Telephone Encounter (Signed)
It doesn't appear that we have prescribed this for her before. Needs OV first please.

## 2018-05-01 NOTE — Telephone Encounter (Signed)
Notified pt and she voices understanding. States she wants to wait and discuss this with PCP at first available opening after 5pm and also wants to discuss questions about BP medication. PCP is out of the office next week and pt wishes to wait until she returns. She asks that we let her know if there are any cancellations Monday, 05/11/18 and notation has been made. Appt currently scheduled for 05/13/18 at 12pm.

## 2018-05-01 NOTE — Telephone Encounter (Signed)
Noted  

## 2018-05-13 ENCOUNTER — Encounter: Payer: Self-pay | Admitting: Family

## 2018-05-13 ENCOUNTER — Ambulatory Visit: Payer: 59 | Admitting: Family

## 2018-05-13 VITALS — BP 139/73 | HR 80 | Temp 98.1°F | Resp 20 | Ht 67.0 in | Wt 243.0 lb

## 2018-05-13 DIAGNOSIS — I10 Essential (primary) hypertension: Secondary | ICD-10-CM | POA: Diagnosis not present

## 2018-05-13 DIAGNOSIS — M5410 Radiculopathy, site unspecified: Secondary | ICD-10-CM

## 2018-05-13 DIAGNOSIS — M545 Low back pain: Secondary | ICD-10-CM | POA: Diagnosis not present

## 2018-05-13 MED ORDER — METHYLPREDNISOLONE 4 MG PO TBPK
ORAL_TABLET | ORAL | 0 refills | Status: DC
Start: 2018-05-13 — End: 2021-01-18

## 2018-05-13 MED ORDER — CYCLOBENZAPRINE HCL 10 MG PO TABS: 10.0000 mg | ORAL_TABLET | ORAL | 0 refills | Status: DC | PRN

## 2018-05-13 NOTE — Patient Instructions (Signed)
Please begin medrol dose pak (steroid) for your neck pain. You may use flexeril as needed for severe back pain. You should be contacted about your referral to neurosurgery.

## 2018-05-13 NOTE — Progress Notes (Signed)
Subjective:    Patient ID: Felicia Chase, female    DOB: [birth year], 57 y.o.   MRN: [id number]  HPI   Patient presents today for follow up.  1) Back pain- reports that she has used flexeril in the past which has helped with her back pain symptoms. Reports hx of DDD, scoliosis, "bulging discs." reports that she has used cyclobenzaprine in the past and uses prn which helps.  She sees   2) HTN- she is maintained on amlodipine 5mg  and lopressor 50mg .   BP Readings from Last 3 Encounters:  05/13/18 139/73  04/01/18 122/69  01/26/18 134/72   3) Reports left cervical tingling-this has been going on for some time.  She denies upper extremity weakness.    Review of Systems     Past Medical History:  Diagnosis Date  . Allergy   . Arthritis    back and ankle  . Borderline diabetes 12/23/2017  . GERD (gastroesophageal reflux disease)   . H/O seasonal allergies   . Hiatal hernia   . Hypertension      Social History   Socioeconomic History  . Marital status: Single    Spouse name: Not on file  . Number of children: 1  . Years of education: Not on file  . Highest education level: Not on file  Occupational History  . Occupation: [employer]  . Financial resource strain: Not on file  . Food insecurity:    Worry: Not on file    Inability: Not on file  . Transportation needs:    Medical: Not on file    Non-medical: Not on file  Tobacco Use  . Smoking status: Never Smoker  . Smokeless tobacco: Never Used  Substance and Sexual Activity  . Alcohol use: Yes    Comment: occasional wine  . Drug use: No  . Sexual activity: Never    Birth control/protection: Surgical  Lifestyle  . Physical activity:    Days per week: Not on file    Minutes per session: Not on file  . Stress: Not on file  Relationships  . Social connections:    Talks on phone: Not on file    Gets together: Not on file    Attends religious service: Not on file    Active member of club or  organization: Not on file    Attends meetings of clubs or organizations: Not on file    Relationship status: Not on file  . Intimate partner violence:    Fear of current or ex partner: Not on file    Emotionally abused: Not on file    Physically abused: Not on file    Forced sexual activity: Not on file  Other Topics Concern  . Not on file  Social History Narrative  . Not on file    Past Surgical History:  Procedure Laterality Date  . ANKLE SURGERY Left   . CHOLECYSTECTOMY    . TONSILLECTOMY    . TUBAL LIGATION      Family History  Problem Relation Age of Onset  . Breast cancer Mother   . Breast cancer Maternal Aunt   . Diabetes Maternal Aunt   . Hypertension Maternal Grandmother   . Colon cancer Neg Hx   . Rectal cancer Neg Hx   . Esophageal cancer Neg Hx   . Stomach cancer Neg Hx     Allergies  Allergen Reactions  . Penicillins Anaphylaxis and Rash  . Cefdinir [Cefdinir] Swelling  .  Amoxicillin Rash  . Latex Rash  . Nickel Hives and Rash  . Vicodin [Hydrocodone-Acetaminophen] Rash    Current Outpatient Medications on File Prior to Visit  Medication Sig Dispense Refill  . amLODipine (NORVASC) 5 MG tablet TAKE 1 TABLET BY MOUTH EVERY DAY 30 tablet 3  . cyclobenzaprine (FLEXERIL) 10 MG tablet Take 10 mg as needed by mouth for muscle spasms (back / ankle pain).    . flunisolide (NASALIDE) 25 MCG/ACT (0.025%) SOLN Place 1 spray into the nose 2 (two) times daily. 1 Bottle 3  . hydrocortisone (ANUSOL-HC) 25 MG suppository Place 1 suppository (25 mg total) rectally 2 (two) times daily as needed for hemorrhoids or anal itching. 12 suppository 0  . metoprolol tartrate (LOPRESSOR) 50 MG tablet Take 1 tablet (50 mg total) by mouth 2 (two) times daily. 180 tablet 1  . naproxen (NAPROSYN) 500 MG tablet Take 500 mg as needed by mouth.    Marland Kitchen omeprazole (PRILOSEC) 40 MG capsule TAKE 1 CAPSULE BY MOUTH EVERY DAY 90 capsule 1   Current Facility-Administered Medications on File  Prior to Visit  Medication Dose Route Frequency Provider Last Rate Last Dose  . 0.9 %  sodium chloride infusion  500 mL Intravenous Once Danis, Estill Cotta III, MD        BP 139/73 (BP Location: Left Arm, Cuff Size: Large)   Pulse 80   Temp 98.1 F (36.7 C) (Oral)   Resp 20   Ht 5\' 7"  (1.702 m)   Wt 243 lb (110.2 kg)   SpO2 100%   BMI 38.06 kg/m    Objective:   Physical Exam  Constitutional: She is oriented to person, place, and time. She appears well-developed and well-nourished.  Cardiovascular: Normal rate, regular rhythm and normal heart sounds.  No murmur heard. Pulmonary/Chest: Effort normal and breath sounds normal. No respiratory distress. She has no wheezes.  Neurological: She is alert and oriented to person, place, and time.  Reflex Scores:      Patellar reflexes are 1+ on the left side. Bilateral hand grasps are 5 out of 5.  Bilateral upper extremity and lower extremity strength is  Psychiatric: She has a normal mood and affect. Her behavior is normal. Judgment and thought content normal.          Assessment & Plan:  Cervical radiculopathy-we will give trial of Medrol Dosepak for evaluation.  Chronic low back pain-unchanged.  May have some temporary improvement with Medrol as above.  Refer to neurosurgery for further evaluation new  Hypertension-stable on current medications.  Continue same.

## 2018-06-09 NOTE — Telephone Encounter (Signed)
Erroneous Encounter

## 2018-07-08 ENCOUNTER — Other Ambulatory Visit: Payer: Self-pay | Admitting: Neurological Surgery

## 2018-07-08 ENCOUNTER — Telehealth: Payer: Self-pay

## 2018-07-08 DIAGNOSIS — M4722 Other spondylosis with radiculopathy, cervical region: Secondary | ICD-10-CM

## 2018-07-08 MED ORDER — FLUCONAZOLE 150 MG PO TABS: 150.0000 mg | ORAL_TABLET | Freq: Once | ORAL | 0 refills | Status: AC

## 2018-07-08 NOTE — Telephone Encounter (Signed)
  Please advise   Patient is requesting a call from Debbrah Alar or  her nurse. She stated she is needing something called in for a Yeast Infection.  Complains of vaginal discharge for about a week, some itching.    Preffered Pharmacy: [facility], [place] - [address] [phone] (Phone) [phone] (Fax)

## 2018-07-08 NOTE — Addendum Note (Signed)
Addended by: Debbrah Alar on: 07/08/2018 05:23 PM   Modules accepted: Orders

## 2018-07-08 NOTE — Telephone Encounter (Signed)
Rx has been sent. Please let pt know that if symptoms are not improved in 1 week needs OV.

## 2018-07-09 NOTE — Telephone Encounter (Signed)
Lm for patient to be aware rx was sent and to call us back if no improvement in 1 week.

## 2018-07-19 ENCOUNTER — Ambulatory Visit
Admission: RE | Admit: 2018-07-19 | Discharge: 2018-07-19 | Disposition: A | Discharge status: Home / Self Care | Payer: 59 | Source: Ambulatory Visit | Attending: Neurological Surgery | Admitting: Neurological Surgery

## 2018-07-19 DIAGNOSIS — M4722 Other spondylosis with radiculopathy, cervical region: Secondary | ICD-10-CM

## 2018-07-22 ENCOUNTER — Other Ambulatory Visit: Payer: Self-pay | Admitting: Family

## 2018-07-22 ENCOUNTER — Encounter: Payer: Self-pay | Admitting: Family Medicine

## 2018-07-22 ENCOUNTER — Other Ambulatory Visit (HOSPITAL_COMMUNITY)
Admission: RE | Admit: 2018-07-22 | Discharge: 2018-07-22 | Disposition: A | Discharge status: Home / Self Care | Payer: 59 | Source: Ambulatory Visit | Attending: Family Medicine | Admitting: Family Medicine

## 2018-07-22 ENCOUNTER — Other Ambulatory Visit: Payer: Self-pay

## 2018-07-22 ENCOUNTER — Ambulatory Visit: Payer: 59 | Admitting: Family Medicine

## 2018-07-22 ENCOUNTER — Other Ambulatory Visit (HOSPITAL_COMMUNITY)
Admission: RE | Admit: 2018-07-22 | Discharge: 2018-07-22 | Disposition: A | Discharge status: Home / Self Care | Payer: 59 | Source: Ambulatory Visit | Attending: Family | Admitting: Family

## 2018-07-22 VITALS — BP 122/80 | HR 99 | Ht 67.0 in | Wt 241.2 lb

## 2018-07-22 DIAGNOSIS — N898 Other specified noninflammatory disorders of vagina: Secondary | ICD-10-CM

## 2018-07-22 DIAGNOSIS — M545 Low back pain, unspecified: Secondary | ICD-10-CM

## 2018-07-22 DIAGNOSIS — K219 Gastro-esophageal reflux disease without esophagitis: Secondary | ICD-10-CM

## 2018-07-22 DIAGNOSIS — I10 Essential (primary) hypertension: Secondary | ICD-10-CM

## 2018-07-22 DIAGNOSIS — G8929 Other chronic pain: Secondary | ICD-10-CM

## 2018-07-22 MED ORDER — FLUCONAZOLE 150 MG PO TABS: ORAL_TABLET | ORAL | 0 refills | Status: DC

## 2018-07-22 MED ORDER — AMLODIPINE BESYLATE 5 MG PO TABS: 5.0000 mg | ORAL_TABLET | Freq: Every day | ORAL | 3 refills | Status: DC

## 2018-07-22 MED ORDER — OMEPRAZOLE 40 MG PO CPDR: 40.0000 mg | DELAYED_RELEASE_CAPSULE | Freq: Every day | ORAL | 3 refills | Status: DC

## 2018-07-22 MED ORDER — METRONIDAZOLE 500 MG PO TABS: 500.0000 mg | ORAL_TABLET | Freq: Two times a day (BID) | ORAL | 0 refills | Status: DC

## 2018-07-22 MED ORDER — METOPROLOL TARTRATE 50 MG PO TABS: 50.0000 mg | ORAL_TABLET | Freq: Two times a day (BID) | ORAL | 3 refills | Status: DC

## 2018-07-22 MED ORDER — CYCLOBENZAPRINE HCL 10 MG PO TABS: 10.0000 mg | ORAL_TABLET | ORAL | 0 refills | Status: DC | PRN

## 2018-07-22 NOTE — Telephone Encounter (Signed)
See OV note from today 07/22/18. meds refilled and vaginal discharge/itching addressed

## 2018-07-22 NOTE — Progress Notes (Signed)
Felicia Chase is a 57 y.o. female  Chief Complaint  Patient presents with  . Follow-up    HPI: Felicia Chase is a 57 y.o. female presents to the office requesting refills of her 3 daily meds and 1 PRN med. She states she was terminated from her job earlier this afternoon and her insurance is only effective until midnight tonight. She is requesting 90 day supplies of her 3 daily meds and a refill of the flexeril 5mg  which she takes 1-2 tabs every 1-2 weeks. BP has been at goal. Pt denies HA, vision changes, CP, SOB, LE edema, dizziness. She takes the flexeril for back pain.   She also complains of vaginal itching, discharge. She states she was prescribed diflucan 150mg  x 1 dose over the phone about 2 weeks ago but her symptoms did not resolve. She gave a urine sample at PCP's office today as per refill encounter in chart from today, it will be tested for GC/chlamdyia, trich. Pt states she is not sexually active and has not been for years. A wet prep has not been/was not done. No fever, chills, abnormal bleeding.   Past Medical History:  Diagnosis Date  . Allergy   . Arthritis    back and ankle  . Borderline diabetes 12/23/2017  . GERD (gastroesophageal reflux disease)   . H/O seasonal allergies   . Hiatal hernia   . Hypertension     Past Surgical History:  Procedure Laterality Date  . ANKLE SURGERY Left   . CHOLECYSTECTOMY    . TONSILLECTOMY    . TUBAL LIGATION      Social History   Socioeconomic History  . Marital status: Single    Spouse name: Not on file  . Number of children: 1  . Years of education: Not on file  . Highest education level: Not on file  Occupational History  . Occupation: [employer]  . Financial resource strain: Not on file  . Food insecurity:    Worry: Not on file    Inability: Not on file  . Transportation needs:    Medical: Not on file    Non-medical: Not on file  Tobacco Use  . Smoking status: Never Smoker  . Smokeless  tobacco: Never Used  Substance and Sexual Activity  . Alcohol use: Yes    Comment: occasional wine  . Drug use: No  . Sexual activity: Never    Birth control/protection: Surgical  Lifestyle  . Physical activity:    Days per week: Not on file    Minutes per session: Not on file  . Stress: Not on file  Relationships  . Social connections:    Talks on phone: Not on file    Gets together: Not on file    Attends religious service: Not on file    Active member of club or organization: Not on file    Attends meetings of clubs or organizations: Not on file    Relationship status: Not on file  . Intimate partner violence:    Fear of current or ex partner: Not on file    Emotionally abused: Not on file    Physically abused: Not on file    Forced sexual activity: Not on file  Other Topics Concern  . Not on file  Social History Narrative  . Not on file    Family History  Problem Relation Age of Onset  . Breast cancer Mother   . Breast cancer Maternal  Aunt   . Diabetes Maternal Aunt   . Hypertension Maternal Grandmother   . Colon cancer Neg Hx   . Rectal cancer Neg Hx   . Esophageal cancer Neg Hx   . Stomach cancer Neg Hx      Immunization History  Administered Date(s) Administered  . Td 03/16/2016    Outpatient Encounter Medications as of 07/22/2018  Medication Sig  . amLODipine (NORVASC) 5 MG tablet Take 1 tablet (5 mg total) by mouth daily.  . cyclobenzaprine (FLEXERIL) 10 MG tablet Take 1 tablet (10 mg total) by mouth as needed for muscle spasms (back / ankle pain).  . flunisolide (NASALIDE) 25 MCG/ACT (0.025%) SOLN Place 1 spray into the nose 2 (two) times daily.  . hydrocortisone (ANUSOL-HC) 25 MG suppository Place 1 suppository (25 mg total) rectally 2 (two) times daily as needed for hemorrhoids or anal itching.  . methylPREDNISolone (MEDROL DOSEPAK) 4 MG TBPK tablet Take per package instructions  . metoprolol tartrate (LOPRESSOR) 50 MG tablet Take 1 tablet (50 mg  total) by mouth 2 (two) times daily.  . naproxen (NAPROSYN) 500 MG tablet Take 500 mg as needed by mouth.  Marland Kitchen omeprazole (PRILOSEC) 40 MG capsule Take 1 capsule (40 mg total) by mouth daily.  . [DISCONTINUED] amLODipine (NORVASC) 5 MG tablet TAKE 1 TABLET BY MOUTH EVERY DAY  . [DISCONTINUED] cyclobenzaprine (FLEXERIL) 10 MG tablet Take 1 tablet (10 mg total) by mouth as needed for muscle spasms (back / ankle pain).  . [DISCONTINUED] metoprolol tartrate (LOPRESSOR) 50 MG tablet Take 1 tablet (50 mg total) by mouth 2 (two) times daily.  . [DISCONTINUED] omeprazole (PRILOSEC) 40 MG capsule TAKE 1 CAPSULE BY MOUTH EVERY DAY  . fluconazole (DIFLUCAN) 150 MG tablet Take 1 tab po x 1, may repeat x 1 in 72 hrs  . metroNIDAZOLE (FLAGYL) 500 MG tablet Take 1 tablet (500 mg total) by mouth 2 (two) times daily.   Facility-Administered Encounter Medications as of 07/22/2018  Medication  . 0.9 %  sodium chloride infusion     ROS: Gen: no fever, chills  Resp: no cough, wheeze,SOB CV: no CP, palpitations, LE edema,  GI: no heartburn, n/v/d/c, abd pain GU: no dysuria, urgency, frequency, hematuria; as above in HPI MSK: no joint pain, myalgias, back pain Neuro: no dizziness, headache, weakness, vertigo Psych: no depression, anxiety, insomnia   Allergies  Allergen Reactions  . Penicillins Anaphylaxis and Rash  . Cefdinir [Cefdinir] Swelling  . Amoxicillin Rash  . Latex Rash  . Nickel Hives and Rash  . Vicodin [Hydrocodone-Acetaminophen] Rash    BP 122/80 (BP Location: Left Arm, Patient Position: Sitting, Cuff Size: Large)   Pulse 99   Ht 5\' 7"  (1.702 m)   Wt 241 lb 4 oz (109.4 kg)   SpO2 97%   BMI 37.79 kg/m   BP Readings from Last 3 Encounters:  07/22/18 122/80  05/13/18 139/73  04/01/18 122/69    Physical Exam  Constitutional: She is oriented to person, place, and time. She appears well-developed and well-nourished. No distress.  Cardiovascular: Normal rate, regular rhythm, normal  heart sounds and intact distal pulses.  Pulmonary/Chest: Breath sounds normal. She is in respiratory distress. She has no wheezes.  Abdominal: Soft. Bowel sounds are normal. She exhibits no distension. There is no tenderness.  Genitourinary: There is no rash on the right labia. There is rash on the left labia. Cervix exhibits no motion tenderness. Right adnexum displays no mass and no tenderness. Left adnexum displays no mass  and no tenderness. No erythema or tenderness in the vagina. Vaginal discharge (moderate amount of thin, white/grey discharge noted) found.  Neurological: She is alert and oriented to person, place, and time.  Skin: Skin is warm and dry.  Psychiatric: She has a normal mood and affect. Her behavior is normal.     A/P:  1. Chronic low back pain, unspecified back pain laterality, unspecified whether sciatica present - chronic, stable Refill: - cyclobenzaprine (FLEXERIL) 10 MG tablet; Take 1 tablet (10 mg total) by mouth as needed for muscle spasms (back / ankle pain).  Dispense: 60 tablet; Refill: 0  2. Essential hypertension - well-controlled, at goal Refill: - metoprolol tartrate (LOPRESSOR) 50 MG tablet; Take 1 tablet (50 mg total) by mouth 2 (two) times daily.  Dispense: 180 tablet; Refill: 3 - amLODipine (NORVASC) 5 MG tablet; Take 1 tablet (5 mg total) by mouth daily.  Dispense: 90 tablet; Refill: 3  3. Gastroesophageal reflux disease, esophagitis presence not specified - chronic issue, stable Refill: - omeprazole (PRILOSEC) 40 MG capsule; Take 1 capsule (40 mg total) by mouth daily.  Dispense: 90 capsule; Refill: 3  4. Vaginal itching 5. Vaginal discharge - Cervicovaginal ancillary only( [facility]) - pt will no longer have health insurance after 12am 07/23/18 so will send Rx to treat both BV and yeast so pt can pick up today while she still has insurance. I will contact her tomorrow once result available and advise her of appropriate treatment Rx: -  fluconazole (DIFLUCAN) 150 MG tablet; Take 1 tab po x 1, may repeat x 1 in 72 hrs  Dispense: 2 tablet; Refill: 0 - metroNIDAZOLE (FLAGYL) 500 MG tablet; Take 1 tablet (500 mg total) by mouth 2 (two) times daily.  Dispense: 14 tablet; Refill: 0 Discussed plan and reviewed medications with patient, including risks, benefits, and potential side effects. Pt expressed understand. All questions answered.

## 2018-07-22 NOTE — Telephone Encounter (Signed)
Pt. Walked into clinic, stating that she needs to be seen as yeast infection sx are not improving, and insurance is ending at midnight tonight d/t pt. being laid off from work today. Pt. stated she needed med refills as well. Melissa stated a urine ancillary for Gc/chlymydia and trich should be indicated, but would need to schedule with another provider today as provider leaving office. Order placed, pt. Left urine sample for processing. Author spoke with Arminda Resides, Buyer, retail at [employer], and scheduled appointment with Dr. Bryan Lemma for [facility] today. Pt. Also requesting 90 day supply of meds to be sent to pharmacy; orders left pended for PCP review. Pt. Appreciative.

## 2018-07-22 NOTE — Telephone Encounter (Signed)
Noted  

## 2018-07-23 LAB — URINE CYTOLOGY ANCILLARY ONLY
Chlamydia: NEGATIVE
Neisseria Gonorrhea: NEGATIVE
Trichomonas: NEGATIVE

## 2018-07-24 ENCOUNTER — Encounter: Payer: Self-pay | Admitting: Family

## 2018-07-24 LAB — CERVICOVAGINAL ANCILLARY ONLY
Bacterial vaginitis: NEGATIVE
Candida vaginitis: NEGATIVE

## 2018-07-24 LAB — URINE CYTOLOGY ANCILLARY ONLY: Candida vaginitis: NEGATIVE

## 2018-10-15 ENCOUNTER — Telehealth: Payer: Self-pay | Admitting: *Deleted

## 2018-10-15 NOTE — Telephone Encounter (Signed)
Received request for Medical Records from [facility]; forwarded to Medical Records via email/scan/SLS

## 2019-04-10 ENCOUNTER — Encounter (HOSPITAL_BASED_OUTPATIENT_CLINIC_OR_DEPARTMENT_OTHER): Payer: Self-pay | Admitting: Emergency Medicine

## 2019-04-10 ENCOUNTER — Emergency Department (HOSPITAL_BASED_OUTPATIENT_CLINIC_OR_DEPARTMENT_OTHER)
Admission: EM | Admit: 2019-04-10 | Discharge: 2019-04-10 | Disposition: A | Discharge status: Home / Self Care | Payer: Self-pay | Attending: Emergency Medicine | Admitting: Emergency Medicine

## 2019-04-10 ENCOUNTER — Emergency Department (HOSPITAL_BASED_OUTPATIENT_CLINIC_OR_DEPARTMENT_OTHER): Payer: Self-pay

## 2019-04-10 ENCOUNTER — Other Ambulatory Visit: Payer: Self-pay

## 2019-04-10 DIAGNOSIS — M5442 Lumbago with sciatica, left side: Secondary | ICD-10-CM | POA: Insufficient documentation

## 2019-04-10 DIAGNOSIS — Z91040 Latex allergy status: Secondary | ICD-10-CM | POA: Insufficient documentation

## 2019-04-10 DIAGNOSIS — I10 Essential (primary) hypertension: Secondary | ICD-10-CM | POA: Insufficient documentation

## 2019-04-10 DIAGNOSIS — Z79899 Other long term (current) drug therapy: Secondary | ICD-10-CM | POA: Insufficient documentation

## 2019-04-10 MED ORDER — METHOCARBAMOL 500 MG PO TABS
500.0000 mg | ORAL_TABLET | Freq: Once | ORAL | Status: AC
  Administered 2019-04-10: 500 mg via ORAL
  Filled 2019-04-10: qty 1

## 2019-04-10 MED ORDER — LIDOCAINE 5 % EX PTCH: 1.0000 | MEDICATED_PATCH | CUTANEOUS | 0 refills | Status: DC

## 2019-04-10 MED ORDER — METHOCARBAMOL 500 MG PO TABS: 500.0000 mg | ORAL_TABLET | Freq: Two times a day (BID) | ORAL | 0 refills | Status: DC

## 2019-04-10 MED ORDER — ACETAMINOPHEN 500 MG PO TABS
1000.0000 mg | ORAL_TABLET | Freq: Once | ORAL | Status: AC
  Administered 2019-04-10: 1000 mg via ORAL
  Filled 2019-04-10: qty 2

## 2019-04-10 MED ORDER — PREDNISONE 20 MG PO TABS: 40.0000 mg | ORAL_TABLET | Freq: Every day | ORAL | 0 refills | Status: AC

## 2019-04-10 NOTE — ED Provider Notes (Signed)
[facility] EMERGENCY DEPARTMENT Provider Note   CSN: [id number] Arrival date & time: 04/10/19  1630   History   Chief Complaint Chief Complaint  Patient presents with   Back Pain    HPI Felicia Chase is a 58 y.o. female with past medical history significant for arthritis, GERD, hypertension, chronic low back pain who presents for evaluation of back pain.  Patient states approximately 4 days PTA she picked up a large container toilet paper and flung this into her back car.  Patient states when this happened she felt a pulling to her left paraspinal muscle tenderness.  Patient states she has had pain to her bar spine since the incident.  She has been taking ibuprofen at home with mild relief of her pain.  She rates her current pain a 7/10.  Pain is worse when she goes from sitting to standing and turns over in bed at night.  She has been able to ambulate at home with her cane which she uses at baseline.  She denies history of IV drug use, bowel or bladder incontinence, saddle paresthesia, history of malignancy, chronic steroid use or increased nighttime pain.  She states she did have an MRI in 2019 of her back which showed DDD.  Was getting injections at that time for her back however states she has not had an injection since middle of 2019 due to lack of insurance.  Denies fever, chills, nausea, vomiting, abdominal pain, diarrhea, constipation, dysuria, hematuria.  Denies additional aggravating or alleviating factors.   History obtained from patient and past medical records.  No interpreter is used.     HPI  Past Medical History:  Diagnosis Date   Allergy    Arthritis    back and ankle   Borderline diabetes 12/23/2017   GERD (gastroesophageal reflux disease)    H/O seasonal allergies    Hiatal hernia    Hypertension     Patient Active Problem List   Diagnosis Date Noted   Borderline diabetes 12/23/2017   Low back pain 07/31/2017   GERD  (gastroesophageal reflux disease) 07/31/2017   Seasonal allergies 07/31/2017   Hypertension 07/31/2017    Past Surgical History:  Procedure Laterality Date   ANKLE SURGERY Left    CHOLECYSTECTOMY     TONSILLECTOMY     TUBAL LIGATION       OB History    Gravida  2   Para      Term      Preterm      AB  1   Living  1     SAB      TAB  1   Ectopic      Multiple      Live Births  1            Home Medications    Prior to Admission medications   Medication Sig Start Date End Date Taking? Authorizing Provider  amLODipine (NORVASC) 5 MG tablet Take 1 tablet (5 mg total) by mouth daily. 07/22/18   Cirigliano, Garvin Fila, DO  cyclobenzaprine (FLEXERIL) 10 MG tablet Take 1 tablet (10 mg total) by mouth as needed for muscle spasms (back / ankle pain). 07/22/18   Cirigliano, Garvin Fila, DO  fluconazole (DIFLUCAN) 150 MG tablet Take 1 tab po x 1, may repeat x 1 in 72 hrs 07/22/18   Cirigliano, Mary K, DO  flunisolide (NASALIDE) 25 MCG/ACT (0.025%) SOLN Place 1 spray into the nose 2 (two) times daily. 09/24/17  Debbrah Alar, NP  hydrocortisone (ANUSOL-HC) 25 MG suppository Place 1 suppository (25 mg total) rectally 2 (two) times daily as needed for hemorrhoids or anal itching. 12/24/17   Duffy Bruce, MD  lidocaine (LIDODERM) 5 % Place 1 patch onto the skin daily. Remove & Discard patch within 12 hours or as directed by MD 04/10/19   Lukus Binion A, PA-C  methocarbamol (ROBAXIN) 500 MG tablet Take 1 tablet (500 mg total) by mouth 2 (two) times daily. 04/10/19   Deniece Rankin A, PA-C  methylPREDNISolone (MEDROL DOSEPAK) 4 MG TBPK tablet Take per package instructions 05/13/18   Debbrah Alar, NP  metoprolol tartrate (LOPRESSOR) 50 MG tablet Take 1 tablet (50 mg total) by mouth 2 (two) times daily. 07/22/18   Cirigliano, Garvin Fila, DO  metroNIDAZOLE (FLAGYL) 500 MG tablet Take 1 tablet (500 mg total) by mouth 2 (two) times daily. 07/22/18   Cirigliano, Garvin Fila, DO    naproxen (NAPROSYN) 500 MG tablet Take 500 mg as needed by mouth.    [provider]  omeprazole (PRILOSEC) 40 MG capsule Take 1 capsule (40 mg total) by mouth daily. 07/22/18   Cirigliano, Garvin Fila, DO  predniSONE (DELTASONE) 20 MG tablet Take 2 tablets (40 mg total) by mouth daily for 5 days. 04/10/19 04/15/19  Tarena Gockley A, PA-C    Family History Family History  Problem Relation Age of Onset   Breast cancer Mother    Breast cancer Maternal Aunt    Diabetes Maternal Aunt    Hypertension Maternal Grandmother    Colon cancer Neg Hx    Rectal cancer Neg Hx    Esophageal cancer Neg Hx    Stomach cancer Neg Hx     Social History Social History   Tobacco Use   Smoking status: Never Smoker   Smokeless tobacco: Never Used  Substance Use Topics   Alcohol use: Yes    Comment: occasional wine   Drug use: No     Allergies   Penicillins, Cefdinir [cefdinir], Amoxicillin, Latex, Nickel, and Vicodin [hydrocodone-acetaminophen]   Review of Systems Review of Systems  Constitutional: Negative.   HENT: Negative.   Respiratory: Negative.   Cardiovascular: Negative.   Gastrointestinal: Negative.   Genitourinary: Negative.   Musculoskeletal: Positive for back pain. Negative for arthralgias, gait problem, joint swelling, neck pain and neck stiffness.  Skin: Negative.   Neurological: Negative.   All other systems reviewed and are negative.    Physical Exam Updated Vital Signs BP (!) 152/90 (BP Location: Right Arm)    Pulse 65    Temp 98.4 F (36.9 C) (Oral)    Resp 18    Wt 108.9 kg    SpO2 100%    BMI 37.59 kg/m   Physical Exam  Physical Exam  Constitutional: Pt appears well-developed and well-nourished. No distress.  HENT:  Head: Normocephalic and atraumatic.  Mouth/Throat: Oropharynx is clear and moist. No oropharyngeal exudate.  Eyes: Conjunctivae are normal.  Neck: Normal range of motion. Neck supple.  Full ROM without pain  Cardiovascular:  Normal rate, regular rhythm and intact distal pulses.   Pulmonary/Chest: Effort normal and breath sounds normal. No respiratory distress. Pt has no wheezes.  Abdominal: Soft. Pt exhibits no distension. There is no tenderness, rebound or guarding. No abd bruit or pulsatile mass Musculoskeletal:  Full range of motion of the T-spine and L-spine with flexion, hyperextension, and lateral flexion. No midline tenderness or stepoffs. No tenderness to palpation of the spinous processes of the T-spine or  L-spine. Mild tenderness to palpation of the paraspinous muscles of the LEFT L-spine.  Positive straight leg raise at 53' on left.  Tenderness palpation over SI and left piriformis muscle. Lymphadenopathy:    Pt has no cervical adenopathy.  Neurological: Pt is alert. Pt has normal reflexes.  Reflex Scores:      Bicep reflexes are 2+ on the right side and 2+ on the left side.      Brachioradialis reflexes are 2+ on the right side and 2+ on the left side.      Patellar reflexes are 2+ on the right side and 2+ on the left side.      Achilles reflexes are 2+ on the right side and 2+ on the left side. Speech is clear and goal oriented, follows commands Normal 5/5 strength in upper and lower extremities bilaterally including dorsiflexion and plantar flexion, strong and equal grip strength Sensation normal to light and sharp touch Moves extremities without ataxia, coordination intact Normal gait--walks with a cane at baseline. Normal balance No Clonus Skin: Skin is warm and dry. No rash noted or lesions noted. Pt is not diaphoretic. No erythema, ecchymosis,edema or warmth.  Psychiatric: Pt has a normal mood and affect. Behavior is normal.  Nursing note and vitals reviewed. ED Treatments / Results  Labs (all labs ordered are listed, but only abnormal results are displayed) Labs Reviewed - No data to display  EKG None  Radiology Dg Lumbar Spine Complete  Result Date: 04/10/2019 CLINICAL DATA:  Back  pain EXAM: LUMBAR SPINE - COMPLETE 4+ VIEW COMPARISON:  MR lumbar spine 11/01/2017 FINDINGS: 5 non-rib-bearing lumbar vertebra. Osseous mineralization normal. Vertebral body and disc space heights maintained. No fracture, subluxation, or bone destruction. Tiny inferior endplate spur L4. Facet degenerative changes L4-L5. No spondylolysis. SI joints preserved. IMPRESSION: Degenerative disc and facet disease changes at L4-L5. No acute abnormalities. Electronically Signed   By: Lavonia Dana M.D.   On: 04/10/2019 18:41       Records reviewed--  MR lumbar 11/02/17   IMPRESSION: 1. Progression of L4-L5 and L5-S1 facet arthrosis compared to the study of 05/31/2008. This may serve as a source of local low back pain. 2. Moderate left L4-5 foraminal stenosis.  No spinal canal stenosis.    Procedures Procedures (including critical care time)  Medications Ordered in ED Medications  acetaminophen (TYLENOL) tablet 1,000 mg (1,000 mg Oral Given 04/10/19 1741)  methocarbamol (ROBAXIN) tablet 500 mg (500 mg Oral Given 04/10/19 1741)     Initial Impression / Assessment and Plan / ED Course  I have reviewed the triage vital signs and the nursing notes.  Pertinent labs & imaging results that were available during my care of the patient were reviewed by me and considered in my medical decision making (see chart for details).  58 year old female appears otherwise well presents for evaluation of back pain.  She is afebrile, nonseptic, non-ill-appearing.  Tenderness to left SI and piriformis muscle.  Positive straight leg raise on left at 35 degrees.  She walks with a cane at baseline which she has been able to do.  Pain began after she picked up a toilet paper bag and swung this into her back car.  Does have history of chronic back pain.  Had MRI performed in 2019 which showed facet arthritis and moderate left L4-L5 foraminal stenosis.  Not currently being followed by neurosurgery or orthopedics.  She denies  any red flags for back pain.  Low suspicion for cauda equina, discitis, osteomyelitis,  transverse myelitis, bacterial infection, acute fracture.  Exam consistent with sciatica.  She is allergic to Opiates. Does not want Im medications in ED.  Will DC home with RICE for symptomatic management as well as lidocaine patches and short course of steroids.  We will have her follow-up with PCP.  The patient has been appropriately medically screened and/or stabilized in the ED. I have low suspicion for any other emergent medical condition which would require further screening, evaluation or treatment in the ED or require inpatient management.  Patient is hemodynamically stable and in no acute distress.  Patient able to ambulate in department prior to ED.  Evaluation does not show acute pathology that would require ongoing or additional emergent interventions while in the emergency department or further inpatient treatment.  I have discussed the diagnosis with the patient and answered all questions.  Pain is been managed while in the emergency department and patient has no further complaints prior to discharge.  Patient is comfortable with plan discussed in room and is stable for discharge at this time.  I have discussed strict return precautions for returning to the emergency department.  Patient was encouraged to follow-up with PCP/specialist refer to at discharge.     Final Clinical Impressions(s) / ED Diagnoses   Final diagnoses:  Acute left-sided low back pain with left-sided sciatica    ED Discharge Orders         Ordered    methocarbamol (ROBAXIN) 500 MG tablet  2 times daily     04/10/19 1907    lidocaine (LIDODERM) 5 %  Every 24 hours     04/10/19 1907    predniSONE (DELTASONE) 20 MG tablet  Daily     04/10/19 1908           Nettie Elm, PA-C 04/10/19 1910    Drenda Freeze, MD 04/11/19 1540

## 2019-04-10 NOTE — ED Triage Notes (Signed)
Pt c/o LT lower back pain with radiation to LLE since Tues

## 2019-04-10 NOTE — Discharge Instructions (Signed)
Take medicines as prescribed.  You may also take Tylenol and ibuprofen for pain.  Please lidocaine patch to your left lower spine.  You will wear this for 12 hours.  You need to remove this after 12 hours.  You must be patch free for 12 hours before we can place an additional patch.  Return to the ED from nursing symptoms

## 2019-04-27 ENCOUNTER — Other Ambulatory Visit: Payer: Self-pay | Admitting: *Deleted

## 2019-04-27 DIAGNOSIS — M545 Low back pain, unspecified: Secondary | ICD-10-CM

## 2019-05-01 ENCOUNTER — Other Ambulatory Visit: Payer: Self-pay

## 2019-05-01 ENCOUNTER — Ambulatory Visit
Admission: RE | Admit: 2019-05-01 | Discharge: 2019-05-01 | Disposition: A | Discharge status: Home / Self Care | Payer: Self-pay | Source: Ambulatory Visit | Attending: *Deleted | Admitting: *Deleted

## 2019-05-01 DIAGNOSIS — M545 Low back pain, unspecified: Secondary | ICD-10-CM

## 2019-05-19 ENCOUNTER — Other Ambulatory Visit (HOSPITAL_COMMUNITY): Payer: Self-pay | Admitting: *Deleted

## 2019-05-19 DIAGNOSIS — Z1231 Encounter for screening mammogram for malignant neoplasm of breast: Secondary | ICD-10-CM

## 2019-05-22 ENCOUNTER — Other Ambulatory Visit: Payer: Self-pay

## 2019-06-17 ENCOUNTER — Encounter (HOSPITAL_COMMUNITY): Payer: Self-pay

## 2019-07-08 ENCOUNTER — Ambulatory Visit (HOSPITAL_COMMUNITY)
Admission: RE | Admit: 2019-07-08 | Discharge: 2019-07-08 | Disposition: A | Discharge status: Home / Self Care | Payer: Self-pay | Source: Ambulatory Visit | Attending: Obstetrics and Gynecology | Admitting: Obstetrics and Gynecology

## 2019-07-08 ENCOUNTER — Encounter (HOSPITAL_COMMUNITY): Payer: Self-pay

## 2019-07-08 ENCOUNTER — Other Ambulatory Visit: Payer: Self-pay

## 2019-07-08 ENCOUNTER — Ambulatory Visit
Admission: RE | Admit: 2019-07-08 | Discharge: 2019-07-08 | Disposition: A | Discharge status: Home / Self Care | Payer: No Typology Code available for payment source | Source: Ambulatory Visit | Attending: Obstetrics and Gynecology | Admitting: Obstetrics and Gynecology

## 2019-07-08 DIAGNOSIS — Z1231 Encounter for screening mammogram for malignant neoplasm of breast: Secondary | ICD-10-CM

## 2019-07-08 DIAGNOSIS — R87810 Cervical high risk human papillomavirus (HPV) DNA test positive: Secondary | ICD-10-CM | POA: Insufficient documentation

## 2019-07-08 DIAGNOSIS — R87610 Atypical squamous cells of undetermined significance on cytologic smear of cervix (ASC-US): Secondary | ICD-10-CM | POA: Insufficient documentation

## 2019-07-08 DIAGNOSIS — Z1239 Encounter for other screening for malignant neoplasm of breast: Secondary | ICD-10-CM | POA: Insufficient documentation

## 2019-07-08 NOTE — Progress Notes (Signed)
Patient referred to [facility] by the [facility] due to having an abnormal Pap smear 06/17/2019 that a colposcopy is recommended for follow-up.  Pap Smear: Pap smear not completed today. Last Pap smear was 06/17/2019 at the [facility] and [facility] with positive HPV. Referred patient to the [facility] for [place] for a colposcopy. Appointment scheduled for 08/03/2019 at 0935. Per patient has a history of one other abnormal Pap smear in the 1980's that cryotherapy was completed for follow-up. Last Pap smear result is in Epic.  Physical exam: Breasts Breasts symmetrical. No skin abnormalities bilateral breasts. No nipple retraction bilateral breasts. No nipple discharge bilateral breasts. No lymphadenopathy. No lumps palpated bilateral breasts. No complaints of pain or tenderness on exam. Referred patient to the [facility] for a screening mammogram. Appointment scheduled for Thursday, July 08, 2019 at 1640.        Pelvic/Bimanual No Pap smear completed today since last Pap smear and HPV typing was 06/17/2019. Pap smear not indicated per BCCCP guidelines.   Smoking History: Patient has never smoked.  Patient Navigation: Patient education provided. Access to services provided for patient through [place] program.    Colorectal Cancer Screening: Per patient has never had a colonoscopy completed. No complaints today.   Breast and Cervical Cancer Risk Assessment: Patient has a family history of her mother and a maternal aunt having breast cancer. Patient has no known genetic mutations or history of radiation treatment to the chest before age 55. Per patient has a history of cervical dysplasia. Patient has no history of being immunocompromised or DES exposure in-utero.  Risk Assessment    Risk Scores      07/08/2019   Last edited by: Loletta Parish, RN   5-year risk: 2.2 %   Lifetime risk: 11.7 %

## 2019-07-08 NOTE — Patient Instructions (Signed)
Explained breast self awareness with [name]. Patient did not need a Pap smear today due to last Pap smear was 06/17/2019. Explained the colposcopy the recommended follow-up for her abnormal Pap smear. Referred patient to the [facility] for [place] for a colposcopy. Appointment scheduled for 08/03/2019 at 0935. Referred patient to the [facility] for a screening mammogram. Appointment scheduled for Thursday, July 08, 2019 at 1640. Patient aware of appointments and will be there. Let patient know the [facility] will follow up with her within the next couple weeks with results of mammogram by letter of phone. Jacqualine Mau QUINASIA GUSSLER verbalized understanding.  Reeya Bound, Arvil Chaco, RN 2:54 PM

## 2019-08-02 ENCOUNTER — Telehealth: Payer: Self-pay | Admitting: Obstetrics & Gynecology

## 2019-08-02 NOTE — Telephone Encounter (Signed)
Spoke to patient about her appointment on 11/17 @ 9:35. Patient instructed to wear a face mask for the entire appointment and no visitors are allowed with her during the visit. Patient screened for covid symptoms and denied having any

## 2019-08-03 ENCOUNTER — Ambulatory Visit (INDEPENDENT_AMBULATORY_CARE_PROVIDER_SITE_OTHER): Payer: Self-pay | Admitting: Obstetrics & Gynecology

## 2019-08-03 ENCOUNTER — Other Ambulatory Visit (HOSPITAL_COMMUNITY)
Admission: RE | Admit: 2019-08-03 | Discharge: 2019-08-03 | Disposition: A | Discharge status: Home / Self Care | Payer: No Typology Code available for payment source | Source: Ambulatory Visit | Attending: Obstetrics & Gynecology | Admitting: Obstetrics & Gynecology

## 2019-08-03 ENCOUNTER — Encounter: Payer: Self-pay | Admitting: Obstetrics & Gynecology

## 2019-08-03 ENCOUNTER — Other Ambulatory Visit: Payer: Self-pay

## 2019-08-03 VITALS — BP 139/88 | HR 79 | Wt 245.0 lb

## 2019-08-03 DIAGNOSIS — R87810 Cervical high risk human papillomavirus (HPV) DNA test positive: Secondary | ICD-10-CM | POA: Insufficient documentation

## 2019-08-03 DIAGNOSIS — R87610 Atypical squamous cells of undetermined significance on cytologic smear of cervix (ASC-US): Secondary | ICD-10-CM | POA: Insufficient documentation

## 2019-08-03 NOTE — Progress Notes (Signed)
Patient ID: Felicia Chase, female   DOB: [birth year], 57 y.o.   MRN: [id number]  Chief Complaint  Patient presents with  . Colposcopy    HPI Felicia Chase is a 58 y.o. female.  [id number]  HPI  Indications: Pap smear on October 2020 showed: ASCUS with POSITIVE high risk HPV. Previous colposcopy: 30+ years ago. Prior cervical treatment: cryosurgery.  Past Medical History:  Diagnosis Date  . Allergy   . Arthritis    back and ankle  . Borderline diabetes 12/23/2017  . GERD (gastroesophageal reflux disease)   . H/O seasonal allergies   . Hiatal hernia   . Hypertension     Past Surgical History:  Procedure Laterality Date  . ANKLE SURGERY Left   . CHOLECYSTECTOMY    . TONSILLECTOMY    . TUBAL LIGATION      Family History  Problem Relation Age of Onset  . Breast cancer Mother   . Breast cancer Maternal Aunt   . Hypertension Maternal Grandmother   . Diabetes Maternal Aunt   . Colon cancer Neg Hx   . Rectal cancer Neg Hx   . Esophageal cancer Neg Hx   . Stomach cancer Neg Hx     Social History Social History   Tobacco Use  . Smoking status: Never Smoker  . Smokeless tobacco: Never Used  Substance Use Topics  . Alcohol use: Yes    Comment: occasional wine  . Drug use: No    Allergies  Allergen Reactions  . Penicillins Anaphylaxis and Rash  . Cefdinir [Cefdinir] Swelling  . Amoxicillin Rash  . Latex Rash  . Nickel Hives and Rash  . Vicodin [Hydrocodone-Acetaminophen] Rash    Current Outpatient Medications  Medication Sig Dispense Refill  . amLODipine (NORVASC) 5 MG tablet Take 1 tablet (5 mg total) by mouth daily. 90 tablet 3  . cyclobenzaprine (FLEXERIL) 10 MG tablet Take 1 tablet (10 mg total) by mouth as needed for muscle spasms (back / ankle pain). 60 tablet 0  . fluconazole (DIFLUCAN) 150 MG tablet Take 1 tab po x 1, may repeat x 1 in 72 hrs 2 tablet 0  . flunisolide (NASALIDE) 25 MCG/ACT (0.025%) SOLN Place 1 spray into the nose 2 (two) times  daily. 1 Bottle 3  . hydrocortisone (ANUSOL-HC) 25 MG suppository Place 1 suppository (25 mg total) rectally 2 (two) times daily as needed for hemorrhoids or anal itching. 12 suppository 0  . lidocaine (LIDODERM) 5 % Place 1 patch onto the skin daily. Remove & Discard patch within 12 hours or as directed by MD 30 patch 0  . methocarbamol (ROBAXIN) 500 MG tablet Take 1 tablet (500 mg total) by mouth 2 (two) times daily. 20 tablet 0  . methylPREDNISolone (MEDROL DOSEPAK) 4 MG TBPK tablet Take per package instructions 21 tablet 0  . metoprolol tartrate (LOPRESSOR) 50 MG tablet Take 1 tablet (50 mg total) by mouth 2 (two) times daily. 180 tablet 3  . metroNIDAZOLE (FLAGYL) 500 MG tablet Take 1 tablet (500 mg total) by mouth 2 (two) times daily. 14 tablet 0  . naproxen (NAPROSYN) 500 MG tablet Take 500 mg as needed by mouth.    Marland Kitchen omeprazole (PRILOSEC) 40 MG capsule Take 1 capsule (40 mg total) by mouth daily. 90 capsule 3   Current Facility-Administered Medications  Medication Dose Route Frequency Provider Last Rate Last Dose  . 0.9 %  sodium chloride infusion  500 mL Intravenous Once Doran Stabler, MD  Review of Systems Review of Systems  Constitutional: Negative.   Respiratory: Negative.   Genitourinary: Negative for pelvic pain, vaginal bleeding and vaginal discharge.    There were no vitals taken for this visit.  Physical Exam Physical Exam Vitals signs and nursing note reviewed. Exam conducted with a chaperone present.  Constitutional:      Appearance: Normal appearance.  Neurological:     Mental Status: She is alert.     Data Reviewed ASCUS pap pos HPV  Assessment    Procedure Details  The risks and benefits of the procedure and Written informed consent obtained. TZ and SCJ seen, no lesions Speculum placed in vagina and excellent visualization of cervix achieved, cervix swabbed x 3 with acetic acid solution.  Specimens: ECC, Bx at 12 and 6  Complications:  none.     Plan    Specimens labelled and sent to Pathology. Call or message to discuss Pathology results in 2 weeks.    LSIL is expected, likely f/u in one year for pap  Felicia Chase 08/03/2019, 9:56 AM

## 2019-08-03 NOTE — Addendum Note (Signed)
Addended by: Woodroe Mode on: 08/03/2019 10:52 AM   Modules accepted: Orders

## 2019-08-03 NOTE — Patient Instructions (Signed)
Colposcopy, Care After This sheet gives you information about how to care for yourself after your procedure. Your doctor may also give you more specific instructions. If you have problems or questions, contact your doctor. What can I expect after the procedure? If you did not have a tissue sample removed (did not have a biopsy), you may only have some spotting for a few days. You can go back to your normal activities. If you had a tissue sample removed, it is common to have:  Soreness and pain. This may last for a few days.  Light-headedness.  Mild bleeding from your vagina or dark-colored, grainy discharge from your vagina. This may last for a few days. You may need to wear a sanitary pad.  Spotting for at least 48 hours after the procedure. Follow these instructions at home:   Take over-the-counter and prescription medicines only as told by your doctor. Ask your doctor what medicines you can start taking again. This is very important if you take blood-thinning medicine.  Do not drive or use heavy machinery while taking prescription pain medicine.  For 3 days, or as long as your doctor tells you, avoid: ? Douching. ? Using tampons. ? Having sex.  If you use birth control (contraception), keep using it.  Limit activity for the first day after the procedure. Ask your doctor what activities are safe for you.  It is up to you to get the results of your procedure. Ask your doctor when your results will be ready.  Keep all follow-up visits as told by your doctor. This is important. Contact a doctor if:  You get a skin rash. Get help right away if:  You are bleeding a lot from your vagina. It is a lot of bleeding if you are using more than one pad an hour for 2 hours in a row.  You have clumps of blood (blood clots) coming from your vagina.  You have a fever.  You have chills  You have pain in your lower belly (pelvic area).  You have signs of infection, such as vaginal  discharge that is: ? Different than usual. ? Yellow. ? Bad-smelling.  You have very pain or cramps in your lower belly that do not get better with medicine.  You feel light-headed.  You feel dizzy.  You pass out (faint). Summary  If you did not have a tissue sample removed (did not have a biopsy), you may only have some spotting for a few days. You can go back to your normal activities.  If you had a tissue sample removed, it is common to have mild pain and spotting for 48 hours.  For 3 days, or as long as your doctor tells you, avoid douching, using tampons and having sex.  Get help right away if you have bleeding, very bad pain, or signs of infection. This information is not intended to replace advice given to you by your health care provider. Make sure you discuss any questions you have with your health care provider. Document Released: 02/19/2008 Document Revised: 08/15/2017 Document Reviewed: 05/22/2016 Elsevier Patient Education  2020 [employer]  

## 2019-08-06 LAB — SURGICAL PATHOLOGY

## 2019-08-09 ENCOUNTER — Telehealth: Payer: Self-pay

## 2019-08-09 NOTE — Telephone Encounter (Addendum)
-----   Message from Woodroe Mode, MD sent at 08/06/2019 11:48 AM EST ----- Biopsy was negative, repeat pap in 12 months  LM for pt that her results are normal and that we will see her in one year for f/u.  If she has any questions to please give the office a call.

## 2019-08-17 ENCOUNTER — Encounter: Payer: Self-pay | Admitting: *Deleted

## 2019-08-25 ENCOUNTER — Telehealth (HOSPITAL_COMMUNITY): Payer: Self-pay | Admitting: *Deleted

## 2019-08-25 NOTE — Telephone Encounter (Signed)
Patient left me a voicemail stating she received a bill and is a current [facility] patient. Attempted to call patient back. No answered the phone. Left voicemail for patient to call me back.

## 2019-08-25 NOTE — Telephone Encounter (Signed)
Patient returned my phone call. Patient stated the bill was from date of service 08/03/2019 that was her colposcopy. Explained to patient that the bill should be covered by [facility]. Patient received the bill today and will fax to me. Will get corrected when receive fax.

## 2019-09-14 ENCOUNTER — Telehealth (HOSPITAL_COMMUNITY): Payer: Self-pay | Admitting: *Deleted

## 2019-09-14 NOTE — Telephone Encounter (Signed)
Patient left me a voicemail stating she has received a bill and phone call for her colposcopy appointment on 08/03/2019. Let patient know that I did not receive the fax of her bill. Patient stated she took it somewhere and had it faxed. Patient stated she would need to pay to fax bill. Gave patient my email address to send a copy of the bill to have it corrected. Told patient once I receive a copy of the bill can get it corrected. Patient verbalized understanding.

## 2020-03-28 ENCOUNTER — Other Ambulatory Visit: Payer: Self-pay

## 2020-03-28 ENCOUNTER — Encounter (HOSPITAL_BASED_OUTPATIENT_CLINIC_OR_DEPARTMENT_OTHER): Payer: Self-pay

## 2020-03-28 ENCOUNTER — Emergency Department (HOSPITAL_BASED_OUTPATIENT_CLINIC_OR_DEPARTMENT_OTHER)
Admission: EM | Admit: 2020-03-28 | Discharge: 2020-03-28 | Disposition: A | Discharge status: Home / Self Care | Payer: No Typology Code available for payment source | Attending: Emergency Medicine | Admitting: Emergency Medicine

## 2020-03-28 DIAGNOSIS — Z79899 Other long term (current) drug therapy: Secondary | ICD-10-CM | POA: Insufficient documentation

## 2020-03-28 DIAGNOSIS — I10 Essential (primary) hypertension: Secondary | ICD-10-CM | POA: Insufficient documentation

## 2020-03-28 DIAGNOSIS — R42 Dizziness and giddiness: Secondary | ICD-10-CM | POA: Insufficient documentation

## 2020-03-28 DIAGNOSIS — R002 Palpitations: Secondary | ICD-10-CM | POA: Insufficient documentation

## 2020-03-28 LAB — CBC WITH DIFFERENTIAL/PLATELET
Abs Immature Granulocytes: 0.04 10*3/uL (ref 0.00–0.07)
Basophils Absolute: 0 10*3/uL (ref 0.0–0.1)
Basophils Relative: 0 %
Eosinophils Absolute: 0.1 10*3/uL (ref 0.0–0.5)
Eosinophils Relative: 1 %
HCT: 38.4 % (ref 36.0–46.0)
Hemoglobin: 12 g/dL (ref 12.0–15.0)
Immature Granulocytes: 0 %
Lymphocytes Relative: 31 %
Lymphs Abs: 3.2 10*3/uL (ref 0.7–4.0)
MCH: 26.5 pg (ref 26.0–34.0)
MCHC: 31.3 g/dL (ref 30.0–36.0)
MCV: 85 fL (ref 80.0–100.0)
Monocytes Absolute: 0.8 10*3/uL (ref 0.1–1.0)
Monocytes Relative: 7 %
Neutro Abs: 6.2 10*3/uL (ref 1.7–7.7)
Neutrophils Relative %: 61 %
Platelets: 259 10*3/uL (ref 150–400)
RBC: 4.52 MIL/uL (ref 3.87–5.11)
RDW: 14.3 % (ref 11.5–15.5)
WBC: 10.3 10*3/uL (ref 4.0–10.5)
nRBC: 0 % (ref 0.0–0.2)

## 2020-03-28 LAB — COMPREHENSIVE METABOLIC PANEL
ALT: 17 U/L (ref 0–44)
AST: 14 U/L — ABNORMAL LOW (ref 15–41)
Albumin: 4.2 g/dL (ref 3.5–5.0)
Alkaline Phosphatase: 76 U/L (ref 38–126)
Anion gap: 11 (ref 5–15)
BUN: 16 mg/dL (ref 6–20)
CO2: 25 mmol/L (ref 22–32)
Calcium: 9.3 mg/dL (ref 8.9–10.3)
Chloride: 105 mmol/L (ref 98–111)
Creatinine, Ser: 0.75 mg/dL (ref 0.44–1.00)
GFR calc Af Amer: >60 mL/min (ref >60)
GFR calc non Af Amer: >60 mL/min (ref >60)
Glucose, Bld: 101 mg/dL — ABNORMAL HIGH (ref 70–99)
Potassium: 3.8 mmol/L (ref 3.5–5.1)
Sodium: 141 mmol/L (ref 135–145)
Total Bilirubin: 0.2 mg/dL — ABNORMAL LOW (ref 0.3–1.2)
Total Protein: 7.8 g/dL (ref 6.5–8.1)

## 2020-03-28 LAB — URINALYSIS, ROUTINE W REFLEX MICROSCOPIC
Bilirubin Urine: NEGATIVE
Glucose, UA: NEGATIVE mg/dL
Hgb urine dipstick: NEGATIVE
Ketones, ur: NEGATIVE mg/dL
Leukocytes,Ua: NEGATIVE
Nitrite: NEGATIVE
Protein, ur: NEGATIVE mg/dL
Specific Gravity, Urine: >1.03 — ABNORMAL HIGH (ref 1.005–1.030)
pH: 6 (ref 5.0–8.0)

## 2020-03-28 LAB — CK: Total CK: 80 U/L (ref 38–234)

## 2020-03-28 MED ORDER — SODIUM CHLORIDE 0.9 % IV BOLUS
1000.0000 mL | Freq: Once | INTRAVENOUS | Status: AC
  Administered 2020-03-28: 1000 mL via INTRAVENOUS

## 2020-03-28 MED ORDER — MECLIZINE HCL 25 MG PO TABS
25.0000 mg | ORAL_TABLET | Freq: Three times a day (TID) | ORAL | 0 refills | Status: DC | PRN
Start: 2020-03-28 — End: 2021-09-21

## 2020-03-28 MED ORDER — MECLIZINE HCL 25 MG PO TABS
25.0000 mg | ORAL_TABLET | Freq: Once | ORAL | Status: AC
  Administered 2020-03-28: 25 mg via ORAL
  Filled 2020-03-28: qty 1

## 2020-03-28 NOTE — ED Provider Notes (Signed)
[facility] EMERGENCY DEPARTMENT Provider Note   CSN: [id number] Arrival date & time: 03/28/20  1623     History Chief Complaint  Patient presents with  . Dizziness    Felicia Chase is a 59 y.o. female presenting for evaluation of dizziness and intermittent palpitations.  Patient states for the past 3 days, she has had symptoms of dizziness and palpitations when laying flat.  She states she does not feel "right."  This began after she was outside when it was very hot for an extended period of time.  She has been drinking lots of water without improvement of symptoms.  Symptoms are worse when she goes from sitting to standing.  She describes it both as a lightheaded as well as feeling off balance.  She has a history of vertigo, states this feels similar, although not as bad as previously.  She denies fevers, chills, chest pain, shortness of breath, cough, nausea, vomiting, domino pain, urinary symptoms, normal bowel movements.  She denies chest pain or palpitations currently. She denies recent change in her medications. She has a h/o gerd, htn. No h/o thyroid problems.   HPI     Past Medical History:  Diagnosis Date  . Allergy   . Arthritis    back and ankle  . Borderline diabetes 12/23/2017  . GERD (gastroesophageal reflux disease)   . H/O seasonal allergies   . Hiatal hernia   . Hypertension     Patient Active Problem List   Diagnosis Date Noted  . Screening breast examination 07/08/2019  . Atypical squamous cell changes of undetermined significance (ASCUS) on cervical cytology with positive high risk human papilloma virus (HPV) 07/08/2019  . Borderline diabetes 12/23/2017  . Low back pain 07/31/2017  . GERD (gastroesophageal reflux disease) 07/31/2017  . Seasonal allergies 07/31/2017  . Hypertension 07/31/2017    Past Surgical History:  Procedure Laterality Date  . ANKLE SURGERY Left   . CHOLECYSTECTOMY    . TONSILLECTOMY    . TUBAL LIGATION        OB History    Gravida  2   Para      Term      Preterm      AB  1   Living  1     SAB      TAB  1   Ectopic      Multiple      Live Births  1           Family History  Problem Relation Age of Onset  . Breast cancer Mother   . Breast cancer Maternal Aunt   . Hypertension Maternal Grandmother   . Diabetes Maternal Aunt   . Colon cancer Neg Hx   . Rectal cancer Neg Hx   . Esophageal cancer Neg Hx   . Stomach cancer Neg Hx     Social History   Tobacco Use  . Smoking status: Never Smoker  . Smokeless tobacco: Never Used  Vaping Use  . Vaping Use: Never used  Substance Use Topics  . Alcohol use: Yes    Comment: occasional wine  . Drug use: No    Home Medications Prior to Admission medications   Medication Sig Start Date End Date Taking? Authorizing Provider  amLODipine (NORVASC) 5 MG tablet Take 1 tablet (5 mg total) by mouth daily. 07/22/18   Cirigliano, Garvin Fila, DO  cyclobenzaprine (FLEXERIL) 10 MG tablet Take 1 tablet (10 mg total) by mouth as needed for muscle  spasms (back / ankle pain). 07/22/18   Cirigliano, Garvin Fila, DO  fluconazole (DIFLUCAN) 150 MG tablet Take 1 tab po x 1, may repeat x 1 in 72 hrs 07/22/18   Cirigliano, Mary K, DO  flunisolide (NASALIDE) 25 MCG/ACT (0.025%) SOLN Place 1 spray into the nose 2 (two) times daily. 09/24/17   Debbrah Alar, NP  hydrocortisone (ANUSOL-HC) 25 MG suppository Place 1 suppository (25 mg total) rectally 2 (two) times daily as needed for hemorrhoids or anal itching. 12/24/17   Duffy Bruce, MD  lidocaine (LIDODERM) 5 % Place 1 patch onto the skin daily. Remove & Discard patch within 12 hours or as directed by MD 04/10/19   Henderly, Britni A, PA-C  meclizine (ANTIVERT) 25 MG tablet Take 1 tablet (25 mg total) by mouth 3 (three) times daily as needed for dizziness. 03/28/20   Makella Buckingham, PA-C  methocarbamol (ROBAXIN) 500 MG tablet Take 1 tablet (500 mg total) by mouth 2 (two) times daily. 04/10/19    Henderly, Britni A, PA-C  methylPREDNISolone (MEDROL DOSEPAK) 4 MG TBPK tablet Take per package instructions 05/13/18   Debbrah Alar, NP  metoprolol tartrate (LOPRESSOR) 50 MG tablet Take 1 tablet (50 mg total) by mouth 2 (two) times daily. 07/22/18   Cirigliano, Garvin Fila, DO  metroNIDAZOLE (FLAGYL) 500 MG tablet Take 1 tablet (500 mg total) by mouth 2 (two) times daily. 07/22/18   Cirigliano, Garvin Fila, DO  naproxen (NAPROSYN) 500 MG tablet Take 500 mg as needed by mouth.    [provider]  omeprazole (PRILOSEC) 40 MG capsule Take 1 capsule (40 mg total) by mouth daily. 07/22/18   CiriglianoGarvin Fila, DO    Allergies    Penicillins, Cefdinir [cefdinir], Amoxicillin, Latex, Nickel, and Vicodin [hydrocodone-acetaminophen]  Review of Systems   Review of Systems  Cardiovascular: Positive for palpitations (intermittent).  Neurological: Positive for dizziness and light-headedness.  All other systems reviewed and are negative.   Physical Exam Updated Vital Signs BP 122/77 (BP Location: Left Arm)   Pulse 81   Temp 98.3 F (36.8 C) (Oral)   Resp 16   Ht 5\' 8"  (1.727 m)   Wt 107 kg   SpO2 98%   BMI 35.88 kg/m   Physical Exam Vitals and nursing note reviewed.  Constitutional:      General: She is not in acute distress.    Appearance: She is well-developed.     Comments: Appears nontoxic  HENT:     Head: Normocephalic and atraumatic.  Eyes:     Extraocular Movements: Extraocular movements intact.     Conjunctiva/sclera: Conjunctivae normal.     Pupils: Pupils are equal, round, and reactive to light.     Comments: EOMI and PERRLA.  No nystagmus.  Cardiovascular:     Rate and Rhythm: Normal rate and regular rhythm.     Pulses: Normal pulses.  Pulmonary:     Effort: Pulmonary effort is normal. No respiratory distress.     Breath sounds: Normal breath sounds. No wheezing.  Abdominal:     General: There is no distension.     Palpations: Abdomen is soft. There is no mass.      Tenderness: There is no abdominal tenderness. There is no guarding or rebound.  Musculoskeletal:        General: Normal range of motion.     Cervical back: Normal range of motion and neck supple.     Comments: Strength and sensation intact x4  Skin:    General:  Skin is warm and dry.     Capillary Refill: Capillary refill takes less than 2 seconds.  Neurological:     General: No focal deficit present.     Mental Status: She is alert and oriented to person, place, and time.     GCS: GCS eye subscore is 4. GCS verbal subscore is 5. GCS motor subscore is 6.     Cranial Nerves: Cranial nerves are intact.     Sensory: Sensation is intact.     Motor: Motor function is intact. No pronator drift.     Coordination: Romberg sign negative. Finger-Nose-Finger Test normal.     Gait: Gait is intact.     Comments: No obvious neurologic deficits.  CN intact.  Nose to finger intact.  Fine movement and coordination intact.  Negative pronator drift.  Negative Romberg.  Normal gait.  Patient reports increased dizziness when turning her head from one side to the other.     ED Results / Procedures / Treatments   Labs (all labs ordered are listed, but only abnormal results are displayed) Labs Reviewed  URINALYSIS, ROUTINE W REFLEX MICROSCOPIC - Abnormal; Notable for the following components:      Result Value   Specific Gravity, Urine >1.030 (*)    All other components within normal limits  COMPREHENSIVE METABOLIC PANEL - Abnormal; Notable for the following components:   Glucose, Bld 101 (*)    AST 14 (*)    Total Bilirubin 0.2 (*)    All other components within normal limits  CBC WITH DIFFERENTIAL/PLATELET  CK    EKG None  Radiology No results found.  Procedures Procedures (including critical care time)  Medications Ordered in ED Medications  sodium chloride 0.9 % bolus 1,000 mL (0 mLs Intravenous Stopped 03/28/20 2012)  meclizine (ANTIVERT) tablet 25 mg (25 mg Oral Given 03/28/20 1906)     ED Course  I have reviewed the triage vital signs and the nursing notes.  Pertinent labs & imaging results that were available during my care of the patient were reviewed by me and considered in my medical decision making (see chart for details).    MDM Rules/Calculators/A&P                          Patient presenting for evaluation of dizziness/lightheadedness. On exam, patient appears nontoxic. She has no neurologic deficits. No ataxia. Symptoms are worse with change in position and moving her head, consider positional vertigo. Will obtain labs to rule out electrolyte abnormality, anemia, AKI, uremia. Patient believes the symptoms began after heat exhaustion, as such will obtain a CK of the low suspicion for rhabdo. Will give fluids, meclizine, and reassess.  Labs interpreted by me, overall reassuring. No leukocytosis. Hemoglobin stable. Electrolytes stable. CK negative.  Reassessment, patient is resting comfortably in the bed in no acute distress. Patient reports continued symptoms when standing, although not as severe. She is able to stand without difficulty and ambulate without falling or needing to hold on. Discussed likely diagnosis of positional vertigo, and follow-up with PCP. She states she has an appoint with her PCP next week. I do not believe she needs emergent CT or MRI imaging of the head, as I have low suspicion for CVA, TIA, SAH, ICH, or infectious process of the brain. At this time, patient appears safe for discharge. Return precautions given. Patient states she understands and agrees to plan.  Final Clinical Impression(s) / ED Diagnoses Final diagnoses:  Dizziness  Lightheadedness    Rx / DC Orders ED Discharge Orders         Ordered    meclizine (ANTIVERT) 25 MG tablet  3 times daily PRN     Discontinue  Reprint     03/28/20 2031           Franchot Heidelberg, PA-C 03/28/20 2110    Drenda Freeze, MD 03/28/20 2326

## 2020-03-28 NOTE — ED Triage Notes (Signed)
Heat exertion on sat, has not felt well since. C/o lightheadedness, rapid heart rate and some SOB.  100% RA

## 2020-03-28 NOTE — Discharge Instructions (Signed)
Take meclizine 3 times a day as needed for dizziness. Make sure you are staying well-hydrated water. Follow-up with your primary care doctor as needed for further evaluation. Return to the emergency room if you develop severe headache, weakness/numbness on one side, inability to walk due to dizziness, or any new, worsening, or concerning symptoms

## 2020-07-24 ENCOUNTER — Telehealth: Payer: Self-pay

## 2020-07-24 NOTE — Telephone Encounter (Signed)
Left message for patient about scheduling appointment for mammo and pap with the [facility]. Left name and number for patient to call back.

## 2020-07-26 ENCOUNTER — Other Ambulatory Visit: Payer: Self-pay | Admitting: Obstetrics and Gynecology

## 2020-07-26 ENCOUNTER — Other Ambulatory Visit: Payer: Self-pay

## 2020-07-26 DIAGNOSIS — Z1231 Encounter for screening mammogram for malignant neoplasm of breast: Secondary | ICD-10-CM

## 2020-08-29 ENCOUNTER — Ambulatory Visit: Payer: Self-pay | Admitting: *Deleted

## 2020-08-29 ENCOUNTER — Other Ambulatory Visit: Payer: Self-pay

## 2020-08-29 VITALS — BP 122/84 | Wt 239.3 lb

## 2020-08-29 DIAGNOSIS — Z01419 Encounter for gynecological examination (general) (routine) without abnormal findings: Secondary | ICD-10-CM

## 2020-08-29 NOTE — Progress Notes (Addendum)
Ms. GALINA HADDOX is a 59 y.o. female who presents to [facility] clinic today with no complaints.    Pap Smear: Pap smear not completed today. Last Pap smear was 06/17/2019 at the [facility] and ASCUS with positive HPV. Patient had a colposcopy completed for follow-up 08/03/2019 that was benign. Per patient has a history of one other abnormal Pap smear in the 1980's that cryotherapy was completed for follow-up.Last Pap smear result is available in Epic.   Physical exam: Breasts Breasts symmetrical. No skin abnormalities bilateral breasts. No nipple retraction bilateral breasts. No nipple discharge bilateral breasts. No lymphadenopathy. No lumps palpated bilateral breasts. No complaints of pain or tenderness on exam.         Pelvic/Bimanual Ext Genitalia No lesions, no swelling and no discharge observed on external genitalia.        Vagina Vagina pink and normal texture. No lesions and discharge observed in vagina. Wet prep completed.        Cervix Cervix is present. Cervix pink and of normal texture. No discharge observed.    Uterus Uterus is present and palpable. Uterus in normal position and normal size.        Adnexae Bilateral ovaries present and palpable. No tenderness on palpation.         Rectovaginal No rectal exam completed today since patient had no rectal complaints. No skin abnormalities observed on exam.     Smoking History: Patient has never smoked.   Patient Navigation: Patient education provided. Access to services provided for patient through [facility].   Colorectal Cancer Screening: Per patient has never had colonoscopy completed. No complaints today.    Breast and Cervical Cancer Risk Assessment: Patient has a family history of her mother and a maternal aunt having breast cancer. Patient has no known genetic mutations or history of radiation treatment to the chest before age 60. Patient has a history of cervical dysplasia. Patient has no history of being  immunocompromised or DES exposure in-utero.  Risk Assessment    Risk Scores      08/29/2020 07/08/2019   Last edited by: Demetrius Revel, LPN Malaquias Lenker, Heath Gold, RN   5-year risk: 2.3 % 2.2 %   Lifetime risk: 11.1 % 11.7 %          A: [facility] exam without pap smear No complaints.  P: Referred patient to the [facility] for a screening mammogram on the mobile unit. Appointment scheduled Thursday, August 31, 2020 at 0830.  Loletta Parish, RN  08/29/2020 3:30 PM

## 2020-08-29 NOTE — Patient Instructions (Signed)
Explained breast self awareness with [name]. Pap smear completed today. Let patient know that if today's Pap smear is normal and HPV negative that her next Pap smear is due in one year due to her last Pap smear was abnormal. Referred patient to the [facility] for a screening mammogram on the mobile unit. Appointment scheduled Thursday, August 31, 2020 at 0830. Patient aware of appointment and will be there. Let patient know will follow up with her within the next couple weeks with results of her Pap smear by letter or phone. Informed patient that [facility] will [facility]-up with her within the next couple of weeks with results of her mammogram by letter or phone. Jacqualine Mau KARISA NESSER verbalized understanding.  Kyal Arts, Arvil Chaco, RN 3:30 PM

## 2020-08-30 LAB — CERVICOVAGINAL ANCILLARY ONLY
Bacterial Vaginitis (gardnerella): NEGATIVE
Candida Glabrata: NEGATIVE
Candida Vaginitis: NEGATIVE
Comment: NEGATIVE
Comment: NEGATIVE
Comment: NEGATIVE
Comment: NEGATIVE
Trichomonas: NEGATIVE

## 2020-08-31 ENCOUNTER — Other Ambulatory Visit: Payer: Self-pay

## 2020-08-31 ENCOUNTER — Ambulatory Visit
Admission: RE | Admit: 2020-08-31 | Discharge: 2020-08-31 | Disposition: A | Discharge status: Home / Self Care | Payer: No Typology Code available for payment source | Source: Ambulatory Visit | Attending: Obstetrics and Gynecology | Admitting: Obstetrics and Gynecology

## 2020-08-31 DIAGNOSIS — Z1231 Encounter for screening mammogram for malignant neoplasm of breast: Secondary | ICD-10-CM

## 2020-09-01 LAB — CYTOLOGY - PAP
Comment: NEGATIVE
Diagnosis: NEGATIVE
High risk HPV: NEGATIVE

## 2020-09-04 ENCOUNTER — Telehealth: Payer: Self-pay

## 2020-09-04 NOTE — Telephone Encounter (Signed)
Patient informed negative Pap/HPV, Wet prep results, due for pap in 1 year, given history of abnormal pap smear in 06/2019. Patient verbalized understanding.

## 2020-09-06 ENCOUNTER — Other Ambulatory Visit: Payer: Self-pay | Admitting: Obstetrics and Gynecology

## 2020-09-06 DIAGNOSIS — R928 Other abnormal and inconclusive findings on diagnostic imaging of breast: Secondary | ICD-10-CM

## 2020-09-19 ENCOUNTER — Other Ambulatory Visit: Payer: Self-pay | Admitting: Obstetrics and Gynecology

## 2020-09-19 ENCOUNTER — Ambulatory Visit
Admission: RE | Admit: 2020-09-19 | Discharge: 2020-09-19 | Disposition: A | Discharge status: Home / Self Care | Payer: No Typology Code available for payment source | Source: Ambulatory Visit | Attending: Obstetrics and Gynecology | Admitting: Obstetrics and Gynecology

## 2020-09-19 ENCOUNTER — Other Ambulatory Visit: Payer: Self-pay

## 2020-09-19 DIAGNOSIS — R928 Other abnormal and inconclusive findings on diagnostic imaging of breast: Secondary | ICD-10-CM

## 2020-11-20 ENCOUNTER — Encounter (HOSPITAL_BASED_OUTPATIENT_CLINIC_OR_DEPARTMENT_OTHER): Payer: Self-pay | Admitting: *Deleted

## 2020-11-20 ENCOUNTER — Other Ambulatory Visit: Payer: Self-pay

## 2020-11-20 ENCOUNTER — Emergency Department (HOSPITAL_BASED_OUTPATIENT_CLINIC_OR_DEPARTMENT_OTHER)
Admission: EM | Admit: 2020-11-20 | Discharge: 2020-11-20 | Disposition: A | Discharge status: Home / Self Care | Payer: No Typology Code available for payment source | Attending: Emergency Medicine | Admitting: Emergency Medicine

## 2020-11-20 DIAGNOSIS — Z79899 Other long term (current) drug therapy: Secondary | ICD-10-CM | POA: Insufficient documentation

## 2020-11-20 DIAGNOSIS — L989 Disorder of the skin and subcutaneous tissue, unspecified: Secondary | ICD-10-CM

## 2020-11-20 DIAGNOSIS — Z8614 Personal history of Methicillin resistant Staphylococcus aureus infection: Secondary | ICD-10-CM | POA: Insufficient documentation

## 2020-11-20 DIAGNOSIS — Z91040 Latex allergy status: Secondary | ICD-10-CM | POA: Insufficient documentation

## 2020-11-20 DIAGNOSIS — L910 Hypertrophic scar: Secondary | ICD-10-CM | POA: Insufficient documentation

## 2020-11-20 DIAGNOSIS — I10 Essential (primary) hypertension: Secondary | ICD-10-CM | POA: Insufficient documentation

## 2020-11-20 NOTE — ED Notes (Signed)
See EDP assessment 

## 2020-11-20 NOTE — ED Triage Notes (Signed)
States she has MRSA on her neck and has 2 small pimples starting on her left leg. She has been taking Doxycycline x 4 days with no improvement.

## 2020-11-20 NOTE — ED Provider Notes (Signed)
[facility] EMERGENCY DEPARTMENT Provider Note   CSN: [id number] Arrival date & time: 11/20/20  1747     History No chief complaint on file.   Felicia Chase is a 60 y.o. female who presents to the ED today with complaint of "MRSA on neck" for the past 1.5 weeks. Pt reports that for the past 9 months she has had intermittent areas on her skin that have first started out as a small pimple appearing area. She states that she will apply warm compresses to the area however they will then change colors and shaped. Pt reports that they will become hard and crusty and eventually turn into keloids. Pt saw a provider last week for a new area on her right lateral neck and was diagnosed with MRSA: no culture was obtained. She was started on mupirocin ointment and Doxycycline which she has been taking as prescribed. She states that the area seemed larger today and she texted a photo of the area to the provider who told her to come to the ED for further evaluation. Pt states that the area on her neck was draining thick purulent material before turning hard and crusting over. Pt mentions that she was googling MRSA and wanted to come to the ED for IV antibiotics. She denies fevers or chills. No other complaints at this time.   The history is provided by the patient and medical records.       Past Medical History:  Diagnosis Date  . Allergy   . Arthritis    back and ankle  . Borderline diabetes 12/23/2017  . GERD (gastroesophageal reflux disease)   . H/O seasonal allergies   . Hiatal hernia   . Hypertension     Patient Active Problem List   Diagnosis Date Noted  . Screening breast examination 07/08/2019  . Atypical squamous cell changes of undetermined significance (ASCUS) on cervical cytology with positive high risk human papilloma virus (HPV) 07/08/2019  . Borderline diabetes 12/23/2017  . Low back pain 07/31/2017  . GERD (gastroesophageal reflux disease) 07/31/2017  . Seasonal  allergies 07/31/2017  . Hypertension 07/31/2017    Past Surgical History:  Procedure Laterality Date  . ANKLE SURGERY Left   . CHOLECYSTECTOMY    . TONSILLECTOMY    . TUBAL LIGATION       OB History    Gravida  2   Para      Term      Preterm      AB  1   Living  1     SAB      IAB  1   Ectopic      Multiple      Live Births  1           Family History  Problem Relation Age of Onset  . Breast cancer Mother   . Breast cancer Maternal Aunt   . Hypertension Maternal Grandmother   . Diabetes Maternal Aunt   . Colon cancer Neg Hx   . Rectal cancer Neg Hx   . Esophageal cancer Neg Hx   . Stomach cancer Neg Hx     Social History   Tobacco Use  . Smoking status: Never Smoker  . Smokeless tobacco: Never Used  Vaping Use  . Vaping Use: Never used  Substance Use Topics  . Alcohol use: Yes    Comment: occasional wine  . Drug use: No    Home Medications Prior to Admission medications   Medication Sig  Start Date End Date Taking? Authorizing Provider  cyclobenzaprine (FLEXERIL) 10 MG tablet Take 1 tablet (10 mg total) by mouth as needed for muscle spasms (back / ankle pain). 07/22/18  Yes Cirigliano, Mary K, DO  metoprolol tartrate (LOPRESSOR) 50 MG tablet Take 1 tablet (50 mg total) by mouth 2 (two) times daily. 07/22/18  Yes Cirigliano, Mary K, DO  omeprazole (PRILOSEC) 40 MG capsule Take 1 capsule (40 mg total) by mouth daily. 07/22/18  Yes Cirigliano, Mary K, DO  amLODipine (NORVASC) 5 MG tablet Take 1 tablet (5 mg total) by mouth daily. 07/22/18   Cirigliano, Garvin Fila, DO  fluconazole (DIFLUCAN) 150 MG tablet Take 1 tab po x 1, may repeat x 1 in 72 hrs 07/22/18   Cirigliano, Mary K, DO  flunisolide (NASALIDE) 25 MCG/ACT (0.025%) SOLN Place 1 spray into the nose 2 (two) times daily. 09/24/17   Debbrah Alar, NP  hydrocortisone (ANUSOL-HC) 25 MG suppository Place 1 suppository (25 mg total) rectally 2 (two) times daily as needed for hemorrhoids or anal  itching. 12/24/17   Duffy Bruce, MD  lidocaine (LIDODERM) 5 % Place 1 patch onto the skin daily. Remove & Discard patch within 12 hours or as directed by MD 04/10/19   Henderly, Britni A, PA-C  meclizine (ANTIVERT) 25 MG tablet Take 1 tablet (25 mg total) by mouth 3 (three) times daily as needed for dizziness. 03/28/20   Caccavale, Sophia, PA-C  methocarbamol (ROBAXIN) 500 MG tablet Take 1 tablet (500 mg total) by mouth 2 (two) times daily. 04/10/19   Henderly, Britni A, PA-C  methylPREDNISolone (MEDROL DOSEPAK) 4 MG TBPK tablet Take per package instructions 05/13/18   Debbrah Alar, NP  metroNIDAZOLE (FLAGYL) 500 MG tablet Take 1 tablet (500 mg total) by mouth 2 (two) times daily. 07/22/18   Cirigliano, Garvin Fila, DO  naproxen (NAPROSYN) 500 MG tablet Take 500 mg as needed by mouth.    [provider]    Allergies    Penicillins, Cefdinir [cefdinir], Amoxicillin, Latex, Nickel, and Vicodin [hydrocodone-acetaminophen]  Review of Systems   Review of Systems  Constitutional: Negative for chills and fever.  Skin: Positive for color change.  All other systems reviewed and are negative.   Physical Exam Updated Vital Signs BP (!) 176/92 (BP Location: Right Arm)   Pulse 89   Temp 98.1 F (36.7 C) (Oral)   Resp 20   Ht 5\' 8"  (1.727 m)   Wt 108.2 kg   SpO2 100%   BMI 36.27 kg/m   Physical Exam Vitals and nursing note reviewed.  Constitutional:      Appearance: She is not ill-appearing.  HENT:     Head: Normocephalic and atraumatic.  Eyes:     Conjunctiva/sclera: Conjunctivae normal.  Neck:   Cardiovascular:     Rate and Rhythm: Normal rate and regular rhythm.     Pulses: Normal pulses.  Pulmonary:     Effort: Pulmonary effort is normal.     Breath sounds: Normal breath sounds. No wheezing, rhonchi or rales.  Skin:    General: Skin is warm and dry.     Coloration: Skin is not jaundiced.     Comments: 2 pinpoint macules noted to left thigh without erythema, edema,  drainage Keloid noted to right breast with smaller keloid to left breast where pt reports prior lesions were located Keloid to left mid back as well  Neurological:     Mental Status: She is alert.     ED Results / Procedures /  Treatments   Labs (all labs ordered are listed, but only abnormal results are displayed) Labs Reviewed - No data to display  EKG None  Radiology No results found.  Procedures Procedures   Medications Ordered in ED Medications - No data to display  ED Course  I have reviewed the triage vital signs and the nursing notes.  Pertinent labs & imaging results that were available during my care of the patient were reviewed by me and considered in my medical decision making (see chart for details).    MDM Rules/Calculators/A&P                          60 year old female who presents to the ED today with complaint of possible MRSA to the right lateral neck x1.5 weeks, currently on doxycycline and mupirocin cream.  Reports that the area seems to be getting harder and appearing worse causing her to come to the ED today.  Denies any fevers or chills.  Did not have wound culture for MRSA last week, not actively draining at this time.  On my exam patient is afebrile, nontachycardic, nontachypneic and appears to be no acute distress.  She does admit that she has been googling and was here for IV antibiotics.  On my exam she does have a 2 x 2 centimeter area that appears to be a lesion with induration and crusting; does not appear to be consistent with MRSA at this time.  Patient does have other areas that have turned into keloids on her skin as well as to macules on her left thigh which she reports is how the area starts out. Given she has had multiple areas that do keloid over time there is less suspicion for malignancy. No rash to palms or soles to suggest syphilis. I do feel patient will need to follow-up with dermatology for further evaluation.  She is currently on the  proper treatment for MRSA and without any systemic illness I do not feel she requires lab work or culture or IV antibiotics at this time.  Will give information for dermatology for follow-up.  Attending physician Dr. Ralene Bathe has evaluated patient as well and agrees with plan.  Patient stable for discharge   This note was prepared using Dragon voice recognition software and may include unintentional dictation errors due to the inherent limitations of voice recognition software.  Final Clinical Impression(s) / ED Diagnoses Final diagnoses:  Skin abnormality    Rx / DC Orders ED Discharge Orders    None       Discharge Instructions     Please follow up with [facility] for further evaluation of your skin abnormalities I would recommend continuing the antibiotic until you are finished to cover for a possible MRSA infection       Eustaquio Maize, PA-C 11/20/20 1936    Quintella Reichert, MD 11/22/20 1451

## 2020-11-20 NOTE — Discharge Instructions (Signed)
Please follow up with [facility] for further evaluation of your skin abnormalities I would recommend continuing the antibiotic until you are finished to cover for a possible MRSA infection

## 2020-12-13 ENCOUNTER — Telehealth: Payer: Self-pay

## 2020-12-13 ENCOUNTER — Telehealth: Payer: Self-pay | Admitting: Lactation Services

## 2020-12-13 NOTE — Telephone Encounter (Signed)
Patient called and stated that she has been postmenopausal for 8 years, and currently has heavy bleeding that started on 12/07/2020. Patient states she has a crampy feeling in her lower/abdomen area, similar to how she felt in th past when she was having regular menstrual cycles. Patient also stated that she feels sluggish, emotional-crying at times for no reason. Patient states she had some light bleeding x 1-2 days in 02/2020 or 03/2020, had the same crampy feeling. Patient states she feels her hormones are out of control. She spoke with Reginal Lutes, FNP at [facility], was told needs to be seen. Patient prefers to see a gynecologist, stated she has seen Dr. Roselie Awkward @ [facility] in 07/2019 for colpo, and would like to be seen by him again. Patient informed to contact [facility], number provided.

## 2020-12-13 NOTE — Telephone Encounter (Signed)
Patient called x 2 and LM that she has not had a cycle in 8 years and how now been bleeding since last week. She is requesting a call back.   Returned patients call. She reports she is having mild tenderness to her lower abdomen and is not feeling well. She is not lightheaded or dizzy.   She is changing her pad about 3 times a day now, flow has decreased now. She is leaking in the bed at night.   Patient has been covered by [facility] previously. Reviewed filling out the [place]. She does not have insurance currently.   Reviewed with patient that if bleeding and soaking a pad an hour for more than 2 hours, she is to go to ED for evaluation. Patient voiced understanding.   Message to front desk to schedule patient for 1st available appt for AUB with any MD  provider.

## 2020-12-21 ENCOUNTER — Encounter: Payer: Self-pay | Admitting: Obstetrics and Gynecology

## 2020-12-21 ENCOUNTER — Ambulatory Visit (INDEPENDENT_AMBULATORY_CARE_PROVIDER_SITE_OTHER): Payer: Self-pay | Admitting: Obstetrics and Gynecology

## 2020-12-21 ENCOUNTER — Other Ambulatory Visit: Payer: Self-pay

## 2020-12-21 VITALS — BP 159/80 | HR 75 | Wt 243.8 lb

## 2020-12-21 DIAGNOSIS — N950 Postmenopausal bleeding: Secondary | ICD-10-CM

## 2020-12-21 NOTE — Progress Notes (Signed)
Ms Townsel presents for eval of PMB. Menopause several yrs back Pt reports episode of vaginal bleeding x 1 day followed by a day of spotting Sept of 2021. She started with a regular bleeding cycle March 24 and still is spotting. She denies any pain but does have some cramps at times. Menopausal Sx, mostly hot flashes. Not sexual active. TSVD x 1 ( 7 #) Pap 12/21 normal FH of Breast CA  PE AF  VSS Lungs clear Heart RRR Abd soft + BS GU Nl EGBUS bleeding noted no cervix lesions noted uterus @ 8 weeks non tender no masses  A/P PMB  PMB reviewed with pt. Information provided as well. Discussed GYN U/S and EMBX. Pt desires to have GYN U/S first and EMBX if need. Will schedule GYN U/S. F/U with me in 4 weeks to review U/S results and EMBX as indicated. Will discuss Tx options for menopausal Sx once PMB W/U is completed

## 2020-12-21 NOTE — Patient Instructions (Signed)
Postmenopausal Bleeding Postmenopausal bleeding is any bleeding that occurs after menopause. Menopause is a time in a woman's life when monthly periods stop. Any type of bleeding after menopause should be checked by your doctor. Treatment will depend on the cause. This kind of bleeding can be caused by:  Taking hormones during menopause.  Low or high amounts of female hormones in the body. This can cause the lining of the womb (uterus) to become too thin or too thick.  Cancer.  Growths in the womb that are not cancer. Follow these instructions at home:  Watch for any changes in your symptoms. Let your doctor know about them.  Avoid using tampons and douches as told by your doctor.  Change your pads regularly.  Get regular pelvic exams. This includes Pap tests.  Take iron pills as told by your doctor.  Take over-the-counter and prescription medicines only as told by your doctor.  Keep all follow-up visits.   Contact a doctor if:  You have new bleeding from the vagina after menopause.  You have pain in your belly (abdomen). Get help right away if:  You have a fever or chills.  You have very bad pain with bleeding.  You have clumps of blood (blood clots) coming from your vagina.  You have a lot of bleeding, and: ? You use more than 1 pad an hour. ? This kind of bleeding has never happened before.  You have headaches.  You feel dizzy or you feel like you are going to pass out (faint). Summary  Any type of bleeding after menopause should be checked by your doctor.  Avoid using tampons or douches.  Get regular pelvic exams. This includes Pap tests.  Contact a doctor if you have new bleeding or pain in your belly.  Watch for any changes in your symptoms. Let your doctor know about them. This information is not intended to replace advice given to you by your health care provider. Make sure you discuss any questions you have with your health care provider. Document  Revised: 02/17/2020 Document Reviewed: 02/17/2020 Elsevier Patient Education  [place].

## 2021-01-05 ENCOUNTER — Telehealth: Payer: Self-pay

## 2021-01-05 NOTE — Telephone Encounter (Signed)
Mar//pt called in to request price estimate for [place] scheduled 01/10/2021-advised this information has been sent to [facility].

## 2021-01-10 ENCOUNTER — Ambulatory Visit
Admission: RE | Admit: 2021-01-10 | Discharge: 2021-01-10 | Disposition: A | Discharge status: Home / Self Care | Payer: No Typology Code available for payment source | Source: Ambulatory Visit | Attending: Obstetrics and Gynecology | Admitting: Obstetrics and Gynecology

## 2021-01-10 ENCOUNTER — Other Ambulatory Visit: Payer: Self-pay

## 2021-01-10 DIAGNOSIS — N950 Postmenopausal bleeding: Secondary | ICD-10-CM | POA: Insufficient documentation

## 2021-01-18 ENCOUNTER — Encounter: Payer: Self-pay | Admitting: Obstetrics and Gynecology

## 2021-01-18 ENCOUNTER — Ambulatory Visit (INDEPENDENT_AMBULATORY_CARE_PROVIDER_SITE_OTHER): Payer: Self-pay | Admitting: Obstetrics and Gynecology

## 2021-01-18 VITALS — BP 165/86 | HR 81 | Wt 243.2 lb

## 2021-01-18 DIAGNOSIS — N950 Postmenopausal bleeding: Secondary | ICD-10-CM

## 2021-01-18 NOTE — Patient Instructions (Signed)
Hysteroscopy Hysteroscopy is a procedure used to look inside a woman's womb (uterus). This may be done for various reasons, including:  To look for tumors and other growths in the uterus.  To evaluate abnormal bleeding, fibroid tumors, polyps, scar tissue, or uterine cancer.  To determine why a woman is unable to get pregnant or has had repeated pregnancy losses.  To locate an IUD (intrauterine device).  To place a birth control device into the fallopian tubes. During this procedure, a thin, flexible tube with a small light and camera (hysteroscope) is used to examine the uterus. The camera sends images to a monitor in the room so that your health care provider can view the inside of your uterus. A hysteroscopy should be done right after a menstrual period. Tell a health care provider about:  Any allergies you have.  All medicines you are taking, including vitamins, herbs, eye drops, creams, and over-the-counter medicines.  Any problems you or family members have had with anesthetic medicines.  Any blood disorders you have.  Any surgeries you have had.  Any medical conditions you have.  Whether you are pregnant or may be pregnant.  Whether you have been diagnosed with an STI (sexually transmitted infection) or you think you have an STI. What are the risks? Generally, this is a safe procedure. However, problems may occur, including:  Excessive bleeding.  Infection.  Damage to the uterus or other structures or organs.  Allergic reaction to medicines or fluids that are used in the procedure. What happens before the procedure? Staying hydrated Follow instructions from your health care provider about hydration, which may include:  Up to 2 hours before the procedure - you may continue to drink clear liquids, such as water, clear fruit juice, black coffee, and plain tea. Eating and drinking restrictions Follow instructions from your health care provider about eating and  drinking, which may include:  8 hours before the procedure - stop eating solid foods and drink clear liquids only.  2 hours before the procedure - stop drinking clear liquids. Medicines  Ask your health care provider about: ? Changing or stopping your regular medicines. This is especially important if you are taking diabetes medicines or blood thinners. ? Taking medicines such as aspirin and ibuprofen. These medicines can thin your blood. Do not take these medicines unless your health care provider tells you to take them. ? Taking over-the-counter medicines, vitamins, herbs, and supplements.  Medicine may be placed in your cervix the day before the procedure. This medicine causes the cervix to open (dilate). The larger opening makes it easier for the hysteroscope to be inserted into the uterus during the procedure. General instructions  Ask your health care provider: ? What steps will be taken to help prevent infection. These steps may include:  Washing skin with a germ-killing soap.  Taking antibiotic medicine.  Do not use any products that contain nicotine or tobacco for at least 4 weeks before the procedure. These products include cigarettes, chewing tobacco, and vaping devices, such as e-cigarettes. If you need help quitting, ask your health care provider.  Plan to have a responsible adult take you home from the hospital or clinic.  Plan to have a responsible adult care for you for the time you are told after you leave the hospital or clinic. This is important.  Empty your bladder before the procedure begins. What happens during the procedure?  An IV will be inserted into one of your veins.  You may be given: ?  A medicine to help you relax (sedative). ? A medicine that numbs the area around the cervix (local anesthetic). ? A medicine to make you fall asleep (general anesthetic).  A hysteroscope will be inserted through your vagina and into your uterus.  Air or fluid will  be used to enlarge your uterus to allow your health care provider to see it better. The amount of fluid used will be carefully checked throughout the procedure.  In some cases, tissue may be gently scraped from inside the uterus and sent to a lab for testing (biopsy). The procedure may vary among health care providers and hospitals. What can I expect after the procedure?  Your blood pressure, heart rate, breathing rate, and blood oxygen level will be monitored until you leave the hospital or clinic.  You may have cramps. You may be given medicines for this.  You may have bleeding, which may vary from light spotting to menstrual-like bleeding. This is normal.  If you had a biopsy, it is up to you to get the results. Ask your health care provider, or the department that is doing the procedure, when your results will be ready. Follow these instructions at home: Activity  Rest as told by your health care provider.  Return to your normal activities as told by your health care provider. Ask your health care provider what activities are safe for you.  If you were given a sedative during the procedure, it can affect you for several hours. Do not drive or operate machinery until your health care provider says that it is safe. Medicines  Do not take aspirin or other NSAIDs during recovery, as told by your healthcare provider. It can increase the risk of bleeding.  Ask your health care provider if the medicine prescribed to you: ? Requires you to avoid driving or using machinery. ? Can cause constipation. You may need to take these actions to prevent or treat constipation:  Drink enough fluid to keep your urine pale yellow.  Take over-the-counter or prescription medicines.  Eat foods that are high in fiber, such as beans, whole grains, and fresh fruits and vegetables.  Limit foods that are high in fat and processed sugars, such as fried or sweet foods. General instructions  Do not douche,  use tampons, or have sex for 2 weeks after the procedure, or until your health care provider approves.  Do not take baths, swim, or use a hot tub until your health care provider approves. Take showers instead of baths for 2 weeks, or for as long as told by your health care provider.  Keep all follow-up visits. This is important. Contact a health care provider if:  You feel dizzy or lightheaded.  You feel nauseous.  You have abnormal vaginal discharge.  You have a rash.  You have pain that does not get better with medicine.  You have chills. Get help right away if:  You have bleeding that is heavier than a normal menstrual period.  You have a fever.  You have pain or cramps that get worse.  You develop new abdominal pain.  You faint.  You have pain in your shoulder.  You are short of breath. Summary  Hysteroscopy is a procedure that is used to look inside a woman's womb (uterus).  After the procedure, you may have bleeding, which varies from light spotting to menstrual-like bleeding. This is normal. You may also have cramps.  Do not douche, use tampons, or have sex for 2 weeks after  the procedure, or until your health care provider approves.  Plan to have a responsible adult take you home from the hospital or clinic. This information is not intended to replace advice given to you by your health care provider. Make sure you discuss any questions you have with your health care provider. Document Revised: 04/19/2020 Document Reviewed: 04/19/2020 Elsevier Patient Education  2021 [employer].

## 2021-01-22 ENCOUNTER — Telehealth: Payer: Self-pay | Admitting: Lactation Services

## 2021-01-22 ENCOUNTER — Encounter: Payer: Self-pay | Admitting: *Deleted

## 2021-01-22 ENCOUNTER — Telehealth: Payer: Self-pay | Admitting: *Deleted

## 2021-01-22 NOTE — Telephone Encounter (Signed)
Call to patient. Requesting surgery date on a Friday ( see previous phone message.)  Advised surgery dates are Tuesday and Wednesday. Scheduled for 02-21-21 at 3pm, arrive at 1pm at [facility].  Advised will receive letter, Covid testing info and hospital brochure by mail.   Encounter closed.

## 2021-01-22 NOTE — Progress Notes (Signed)
Ms Hyppolite presents to review her GYN U/S results obtained d/t PMB. U/S results demonstrated thicken endometrium. EMBX recommended to pt. In office procedure vs in hospital procedure reviewed. Pt desires in hospital for better anesthesia.   PE AF VSS Lungs clear  Heart RRR Abd soft + BS  A/P PMB        Thicken Endometrium on U/S.  Hysteroscopy D & C reviewed with pt. R/B/Post op care discussed. Will schedule. F/U with post op appt. Information provided to pt.

## 2021-01-22 NOTE — Telephone Encounter (Signed)
Patient called and LM on nurse voicemail that she is to have a procedure scheduled and she would like it to be scheduled on a Friday.   Per Chart review, patient is scheduled for [facility] on Wednesday 6/8.   Called patient to inform her of her procedure date and time and that rescheduling may delay the procedure in addition to coordinating Dr. Marjory Lies and the OR Schedule. Patient is trying not to miss days of work.   Will route to surgery scheduler for consideration.

## 2021-02-13 ENCOUNTER — Telehealth: Payer: Self-pay | Admitting: *Deleted

## 2021-02-13 NOTE — Telephone Encounter (Signed)
Reviewed with Tammy at [facility]- per policy, will need to reschedule at least 10 days after positive test.  Case will be rescheduled. Call to patient- advised of policy and need to reschedule. Next available date is Tuesday, July 12. Patient agreeable to this date.  Will send new letter with notification.  Advised with symptoms, recommend follow-up with PCP or urgent care. Patient states she has been sending messages through My Chart to PCP. Encouraged to see additional treatment if not improving.   Encounter closed.

## 2021-02-13 NOTE — Telephone Encounter (Signed)
Patient left message that has tested positive for Covid and checking on surgery next week.  Call to patient- home test positive X 2- last one was two days ago.  Fever has resolved, still has cough but has improved. Dizziness with activity.  Advised have contacted surgery center regarding plans for surgery.

## 2021-02-19 NOTE — Progress Notes (Signed)
Screened 01/18/21

## 2021-03-14 NOTE — Pre-Procedure Instructions (Signed)
Surgical Instructions    Your procedure is scheduled on Tuesday July 12th.  Report to [facility] Main Entrance "A" at 12:40 P.M., then check in with the Admitting office.  Call this number if you have problems the morning of surgery:  [phone]   If you have any questions prior to your surgery date call [phone]: Open Monday-Friday 8am-4pm    Remember:  Do not eat after midnight the night before your surgery  You may drink clear liquids until 11:40 A.M. the morning of your surgery.   Clear liquids allowed are: Water, Non-Citrus Juices (without pulp), Carbonated Beverages, Clear Tea, Black Coffee Only, and Gatorade    Take these medicines the morning of surgery with A SIP OF WATER   amLODipine (NORVASC)  CLARITIN  cyclobenzaprine (FLEXERIL)- If needed  flunisolide (NASALIDE)  meclizine (ANTIVERT)- If needed  methocarbamol (ROBAXIN)- If needed  metoprolol tartrate (LOPRESSOR)  omeprazole (PRILOSEC)    As of today, STOP taking any Aspirin (unless otherwise instructed by your surgeon) Aleve, Naproxen, Ibuprofen, Motrin, Advil, Goody's, BC's, all herbal medications, fish oil, and all vitamins.                     Do NOT Smoke (Tobacco/Vaping) or drink Alcohol 24 hours prior to your procedure.  If you use a CPAP at night, you may bring all equipment for your overnight stay.   Contacts, glasses, piercing's, hearing aid's, dentures or partials may not be worn into surgery, please bring cases for these belongings.    For patients admitted to the hospital, discharge time will be determined by your treatment team.   Patients discharged the day of surgery will not be allowed to drive home, and someone needs to stay with them for 24 hours.  ONLY 1 SUPPORT PERSON MAY BE PRESENT WHILE YOU ARE IN SURGERY. IF YOU ARE TO BE ADMITTED ONCE YOU ARE IN YOUR ROOM YOU WILL BE ALLOWED TWO (2) VISITORS.  Minor children may have two parents present. Special consideration for safety and  communication needs will be reviewed on a case by case basis.   Special instructions:   [facility]- Preparing For Surgery  Before surgery, you can play an important role. Because skin is not sterile, your skin needs to be as free of germs as possible. You can reduce the number of germs on your skin by washing with CHG (chlorahexidine gluconate) Soap before surgery.  CHG is an antiseptic cleaner which kills germs and bonds with the skin to continue killing germs even after washing.    Oral Hygiene is also important to reduce your risk of infection.  Remember - BRUSH YOUR TEETH THE MORNING OF SURGERY WITH YOUR REGULAR TOOTHPASTE  Please do not use if you have an allergy to CHG or antibacterial soaps. If your skin becomes reddened/irritated stop using the CHG.  Do not shave (including legs and underarms) for at least 48 hours prior to first CHG shower. It is OK to shave your face.  Please follow these instructions carefully.   Shower the NIGHT BEFORE SURGERY and the MORNING OF SURGERY  If you chose to wash your hair, wash your hair first as usual with your normal shampoo.  After you shampoo, rinse your hair and body thoroughly to remove the shampoo.  Use CHG Soap as you would any other liquid soap. You can apply CHG directly to the skin and wash gently with a scrungie or a clean washcloth.   Apply the CHG Soap to your  body ONLY FROM THE NECK DOWN.  Do not use on open wounds or open sores. Avoid contact with your eyes, ears, mouth and genitals (private parts). Wash Face and genitals (private parts)  with your normal soap.   Wash thoroughly, paying special attention to the area where your surgery will be performed.  Thoroughly rinse your body with warm water from the neck down.  DO NOT shower/wash with your normal soap after using and rinsing off the CHG Soap.  Pat yourself dry with a CLEAN TOWEL.  Wear CLEAN PAJAMAS to bed the night before surgery  Place CLEAN SHEETS on your bed the  night before your surgery  DO NOT SLEEP WITH PETS.   Day of Surgery: Shower with CHG soap. Do not wear jewelry, make up, nail polish, gel polish, artificial nails, or any other type of covering on natural nails including finger and toenails. If patients have artificial nails, gel coating, etc. that need to be removed by a nail salon please have this removed prior to surgery. Surgery may need to be canceled/delayed if the surgeon/ anesthesia feels like the patient is unable to be adequately monitored. Do not wear lotions, powders, perfumes/colognes, or deodorant. Do not shave 48 hours prior to surgery.  Men may shave face and neck. Do not bring valuables to the hospital. [facility] is not responsible for any belongings or valuables. Wear Clean/Comfortable clothing the morning of surgery Remember to brush your teeth WITH YOUR REGULAR TOOTHPASTE.   Please read over the following fact sheets that you were given.

## 2021-03-15 ENCOUNTER — Encounter (HOSPITAL_COMMUNITY): Payer: Self-pay

## 2021-03-15 ENCOUNTER — Other Ambulatory Visit: Payer: Self-pay

## 2021-03-15 ENCOUNTER — Encounter (HOSPITAL_COMMUNITY)
Admission: RE | Admit: 2021-03-15 | Discharge: 2021-03-15 | Disposition: A | Discharge status: Home / Self Care | Payer: No Typology Code available for payment source | Source: Ambulatory Visit | Attending: Obstetrics and Gynecology | Admitting: Obstetrics and Gynecology

## 2021-03-15 DIAGNOSIS — Z01812 Encounter for preprocedural laboratory examination: Secondary | ICD-10-CM | POA: Insufficient documentation

## 2021-03-15 LAB — CBC WITH DIFFERENTIAL/PLATELET
Abs Immature Granulocytes: 0.04 10*3/uL (ref 0.00–0.07)
Basophils Absolute: 0 10*3/uL (ref 0.0–0.1)
Basophils Relative: 0 %
Eosinophils Absolute: 0 10*3/uL (ref 0.0–0.5)
Eosinophils Relative: 0 %
HCT: 36.5 % (ref 36.0–46.0)
Hemoglobin: 11.4 g/dL — ABNORMAL LOW (ref 12.0–15.0)
Immature Granulocytes: 0 %
Lymphocytes Relative: 23 %
Lymphs Abs: 2.3 10*3/uL (ref 0.7–4.0)
MCH: 27.1 pg (ref 26.0–34.0)
MCHC: 31.2 g/dL (ref 30.0–36.0)
MCV: 86.9 fL (ref 80.0–100.0)
Monocytes Absolute: 0.7 10*3/uL (ref 0.1–1.0)
Monocytes Relative: 7 %
Neutro Abs: 7 10*3/uL (ref 1.7–7.7)
Neutrophils Relative %: 70 %
Platelets: 274 10*3/uL (ref 150–400)
RBC: 4.2 MIL/uL (ref 3.87–5.11)
RDW: 13.3 % (ref 11.5–15.5)
WBC: 10.1 10*3/uL (ref 4.0–10.5)
nRBC: 0 % (ref 0.0–0.2)

## 2021-03-15 LAB — BASIC METABOLIC PANEL
Anion gap: 8 (ref 5–15)
BUN: 12 mg/dL (ref 6–20)
CO2: 29 mmol/L (ref 22–32)
Calcium: 9.7 mg/dL (ref 8.9–10.3)
Chloride: 102 mmol/L (ref 98–111)
Creatinine, Ser: 0.91 mg/dL (ref 0.44–1.00)
GFR, Estimated: >60 mL/min (ref >60)
Glucose, Bld: 99 mg/dL (ref 70–99)
Potassium: 4 mmol/L (ref 3.5–5.1)
Sodium: 139 mmol/L (ref 135–145)

## 2021-03-15 NOTE — Progress Notes (Signed)
PCP: Marliss Coots, NP Cardiologist: denies  EKG: 03/15/21 CXR: na ECHO: denies Stress Test: denies Cardiac Cath: denies  Fasting Blood Sugar- na Checks Blood Sugar__na_ times a day  OSA/CPAP: No  ASA/Blood Thinners: No  Covid test not needed  Anesthesia Review: No  Patient denies shortness of breath, fever, cough, and chest pain at PAT appointment.  Patient verbalized understanding of instructions provided today at the PAT appointment.  Patient asked to review instructions at home and day of surgery.

## 2021-03-21 ENCOUNTER — Ambulatory Visit
Admission: RE | Admit: 2021-03-21 | Discharge: 2021-03-21 | Disposition: A | Discharge status: Home / Self Care | Payer: No Typology Code available for payment source | Source: Ambulatory Visit | Attending: Obstetrics and Gynecology | Admitting: Obstetrics and Gynecology

## 2021-03-21 ENCOUNTER — Other Ambulatory Visit: Payer: Self-pay | Admitting: Obstetrics and Gynecology

## 2021-03-21 ENCOUNTER — Other Ambulatory Visit: Payer: Self-pay

## 2021-03-21 DIAGNOSIS — R928 Other abnormal and inconclusive findings on diagnostic imaging of breast: Secondary | ICD-10-CM

## 2021-03-26 MED ORDER — DOXYCYCLINE HYCLATE 100 MG IV SOLR
200.0000 mg | INTRAVENOUS | Status: AC
  Administered 2021-03-27: 200 mg via INTRAVENOUS
  Filled 2021-03-26 (×2): qty 200

## 2021-03-27 ENCOUNTER — Ambulatory Visit (HOSPITAL_COMMUNITY): Payer: No Typology Code available for payment source | Admitting: Anesthesiology

## 2021-03-27 ENCOUNTER — Encounter (HOSPITAL_COMMUNITY)
Admission: RE | Disposition: A | Discharge status: Home / Self Care | Payer: Self-pay | Source: Home / Self Care | Attending: Obstetrics and Gynecology

## 2021-03-27 ENCOUNTER — Other Ambulatory Visit: Payer: Self-pay

## 2021-03-27 ENCOUNTER — Encounter (HOSPITAL_COMMUNITY): Payer: Self-pay | Admitting: Obstetrics and Gynecology

## 2021-03-27 ENCOUNTER — Ambulatory Visit (HOSPITAL_COMMUNITY)
Admission: RE | Admit: 2021-03-27 | Discharge: 2021-03-27 | Disposition: A | Discharge status: Home / Self Care | Payer: No Typology Code available for payment source | Attending: Obstetrics and Gynecology | Admitting: Obstetrics and Gynecology

## 2021-03-27 DIAGNOSIS — Z9049 Acquired absence of other specified parts of digestive tract: Secondary | ICD-10-CM | POA: Insufficient documentation

## 2021-03-27 DIAGNOSIS — N950 Postmenopausal bleeding: Secondary | ICD-10-CM

## 2021-03-27 DIAGNOSIS — Z880 Allergy status to penicillin: Secondary | ICD-10-CM | POA: Insufficient documentation

## 2021-03-27 DIAGNOSIS — Z79899 Other long term (current) drug therapy: Secondary | ICD-10-CM | POA: Insufficient documentation

## 2021-03-27 DIAGNOSIS — Z91040 Latex allergy status: Secondary | ICD-10-CM | POA: Insufficient documentation

## 2021-03-27 DIAGNOSIS — Z881 Allergy status to other antibiotic agents status: Secondary | ICD-10-CM | POA: Insufficient documentation

## 2021-03-27 DIAGNOSIS — N840 Polyp of corpus uteri: Secondary | ICD-10-CM | POA: Insufficient documentation

## 2021-03-27 DIAGNOSIS — Z885 Allergy status to narcotic agent status: Secondary | ICD-10-CM | POA: Insufficient documentation

## 2021-03-27 HISTORY — PX: HYSTEROSCOPY WITH D & C: SHX1775

## 2021-03-27 LAB — POCT PREGNANCY, URINE: Preg Test, Ur: NEGATIVE

## 2021-03-27 SURGERY — DILATATION AND CURETTAGE /HYSTEROSCOPY
Anesthesia: General | Site: Uterus

## 2021-03-27 MED ORDER — SOD CITRATE-CITRIC ACID 500-334 MG/5ML PO SOLN
ORAL | Status: AC
  Administered 2021-03-27: 30 mL via ORAL
  Filled 2021-03-27: qty 30

## 2021-03-27 MED ORDER — LACTATED RINGERS IV SOLN: INTRAVENOUS | Status: DC

## 2021-03-27 MED ORDER — ONDANSETRON HCL 4 MG/2ML IJ SOLN
INTRAMUSCULAR | Status: DC | PRN
  Administered 2021-03-27: 4 mg via INTRAVENOUS

## 2021-03-27 MED ORDER — MIDAZOLAM HCL 5 MG/5ML IJ SOLN
INTRAMUSCULAR | Status: DC | PRN
  Administered 2021-03-27: 2 mg via INTRAVENOUS

## 2021-03-27 MED ORDER — FENTANYL CITRATE (PF) 250 MCG/5ML IJ SOLN
INTRAMUSCULAR | Status: AC
  Filled 2021-03-27: qty 5

## 2021-03-27 MED ORDER — ACETAMINOPHEN 500 MG PO TABS
ORAL_TABLET | ORAL | Status: AC
  Administered 2021-03-27: 1000 mg via ORAL
  Filled 2021-03-27: qty 2

## 2021-03-27 MED ORDER — ORAL CARE MOUTH RINSE: 15.0000 mL | Freq: Once | OROMUCOSAL | Status: AC

## 2021-03-27 MED ORDER — KETOROLAC TROMETHAMINE 15 MG/ML IJ SOLN
INTRAMUSCULAR | Status: AC
  Administered 2021-03-27: 15 mg via INTRAVENOUS
  Filled 2021-03-27: qty 1

## 2021-03-27 MED ORDER — SOD CITRATE-CITRIC ACID 500-334 MG/5ML PO SOLN: 30.0000 mL | ORAL | Status: AC

## 2021-03-27 MED ORDER — ONDANSETRON HCL 4 MG/2ML IJ SOLN
INTRAMUSCULAR | Status: AC
  Filled 2021-03-27: qty 2

## 2021-03-27 MED ORDER — POVIDONE-IODINE 10 % EX SWAB
2.0000 "application " | Freq: Once | CUTANEOUS | Status: AC
  Administered 2021-03-27: 2 via TOPICAL

## 2021-03-27 MED ORDER — CHLORHEXIDINE GLUCONATE 0.12 % MT SOLN: 15.0000 mL | Freq: Once | OROMUCOSAL | Status: AC

## 2021-03-27 MED ORDER — DEXAMETHASONE SODIUM PHOSPHATE 10 MG/ML IJ SOLN
INTRAMUSCULAR | Status: AC
  Filled 2021-03-27: qty 1

## 2021-03-27 MED ORDER — LIDOCAINE 2% (20 MG/ML) 5 ML SYRINGE
INTRAMUSCULAR | Status: DC | PRN
  Administered 2021-03-27: 80 mg via INTRAVENOUS

## 2021-03-27 MED ORDER — KETOROLAC TROMETHAMINE 15 MG/ML IJ SOLN: 15.0000 mg | INTRAMUSCULAR | Status: AC

## 2021-03-27 MED ORDER — BUPIVACAINE HCL 0.5 % IJ SOLN
INTRAMUSCULAR | Status: DC | PRN
  Administered 2021-03-27: 10 mL

## 2021-03-27 MED ORDER — PROPOFOL 10 MG/ML IV BOLUS
INTRAVENOUS | Status: DC | PRN
  Administered 2021-03-27: 200 mg via INTRAVENOUS

## 2021-03-27 MED ORDER — OXYCODONE HCL 5 MG PO TABS: 5.0000 mg | ORAL_TABLET | Freq: Four times a day (QID) | ORAL | 0 refills | Status: DC | PRN

## 2021-03-27 MED ORDER — PROPOFOL 10 MG/ML IV BOLUS
INTRAVENOUS | Status: AC
  Filled 2021-03-27: qty 20

## 2021-03-27 MED ORDER — FENTANYL CITRATE (PF) 250 MCG/5ML IJ SOLN
INTRAMUSCULAR | Status: DC | PRN
  Administered 2021-03-27: 50 ug via INTRAVENOUS

## 2021-03-27 MED ORDER — LIDOCAINE 2% (20 MG/ML) 5 ML SYRINGE
INTRAMUSCULAR | Status: AC
  Filled 2021-03-27: qty 5

## 2021-03-27 MED ORDER — ACETAMINOPHEN 500 MG PO TABS: 1000.0000 mg | ORAL_TABLET | ORAL | Status: AC

## 2021-03-27 MED ORDER — CHLORHEXIDINE GLUCONATE 0.12 % MT SOLN
OROMUCOSAL | Status: AC
  Administered 2021-03-27: 15 mL via OROMUCOSAL
  Filled 2021-03-27: qty 15

## 2021-03-27 MED ORDER — FERRIC SUBSULFATE 259 MG/GM EX SOLN
CUTANEOUS | Status: AC
  Filled 2021-03-27: qty 8

## 2021-03-27 MED ORDER — SODIUM CHLORIDE 0.9 % IR SOLN
Status: DC | PRN
  Administered 2021-03-27: 3000 mL

## 2021-03-27 MED ORDER — TRANEXAMIC ACID-NACL 1000-0.7 MG/100ML-% IV SOLN
INTRAVENOUS | Status: AC
  Filled 2021-03-27: qty 100

## 2021-03-27 MED ORDER — IBUPROFEN 800 MG PO TABS: 800.0000 mg | ORAL_TABLET | Freq: Three times a day (TID) | ORAL | 0 refills | Status: DC | PRN

## 2021-03-27 MED ORDER — MIDAZOLAM HCL 2 MG/2ML IJ SOLN
INTRAMUSCULAR | Status: AC
  Filled 2021-03-27: qty 2

## 2021-03-27 MED ORDER — DEXAMETHASONE SODIUM PHOSPHATE 10 MG/ML IJ SOLN
INTRAMUSCULAR | Status: DC | PRN
  Administered 2021-03-27: 4 mg via INTRAVENOUS

## 2021-03-27 SURGICAL SUPPLY — 13 items
CATH ROBINSON RED A/P 16FR (CATHETERS) ×2 IMPLANT
ELECT REM PT RETURN 9FT ADLT (ELECTROSURGICAL)
ELECTRODE REM PT RTRN 9FT ADLT (ELECTROSURGICAL) IMPLANT
GLOVE SURG ENC MOIS LTX SZ7.5 (GLOVE) ×2 IMPLANT
GLOVE SURG UNDER POLY LF SZ7 (GLOVE) ×2 IMPLANT
GOWN STRL REUS W/ TWL LRG LVL3 (GOWN DISPOSABLE) ×1 IMPLANT
GOWN STRL REUS W/ TWL XL LVL3 (GOWN DISPOSABLE) ×1 IMPLANT
GOWN STRL REUS W/TWL LRG LVL3 (GOWN DISPOSABLE) ×2
GOWN STRL REUS W/TWL XL LVL3 (GOWN DISPOSABLE) ×2
KIT PROCEDURE FLUENT (KITS) ×2 IMPLANT
PACK VAGINAL MINOR WOMEN LF (CUSTOM PROCEDURE TRAY) ×2 IMPLANT
PAD OB MATERNITY 4.3X12.25 (PERSONAL CARE ITEMS) ×2 IMPLANT
TOWEL GREEN STERILE FF (TOWEL DISPOSABLE) ×4 IMPLANT

## 2021-03-27 NOTE — Anesthesia Preprocedure Evaluation (Signed)
Anesthesia Evaluation  Patient identified by MRN, date of birth, ID band Patient awake    Reviewed: Allergy & Precautions, NPO status , Patient's Chart, lab work & pertinent test results  Airway Mallampati: II  TM Distance: >3 FB Neck ROM: Full    Dental no notable dental hx.    Pulmonary neg pulmonary ROS,    Pulmonary exam normal breath sounds clear to auscultation       Cardiovascular hypertension, Normal cardiovascular exam Rhythm:Regular Rate:Normal  Not taking BP meds   Neuro/Psych negative neurological ROS  negative psych ROS   GI/Hepatic Neg liver ROS, GERD  ,  Endo/Other  obesity  Renal/GU negative Renal ROS  negative genitourinary   Musculoskeletal negative musculoskeletal ROS (+)   Abdominal   Peds negative pediatric ROS (+)  Hematology negative hematology ROS (+)   Anesthesia Other Findings   Reproductive/Obstetrics negative OB ROS                             Anesthesia Physical Anesthesia Plan  ASA: 3  Anesthesia Plan: General   Post-op Pain Management:    Induction: Intravenous  PONV Risk Score and Plan: 3 and Ondansetron, Dexamethasone, Midazolam and Treatment may vary due to age or medical condition  Airway Management Planned: LMA  Additional Equipment:   Intra-op Plan:   Post-operative Plan: Extubation in OR  Informed Consent: I have reviewed the patients History and Physical, chart, labs and discussed the procedure including the risks, benefits and alternatives for the proposed anesthesia with the patient or authorized representative who has indicated his/her understanding and acceptance.     Dental advisory given  Plan Discussed with: CRNA and Surgeon  Anesthesia Plan Comments:         Anesthesia Quick Evaluation

## 2021-03-27 NOTE — Anesthesia Procedure Notes (Signed)
Procedure Name: LMA Insertion Date/Time: 03/27/2021 12:41 PM Performed by: Geraldine Contras, CRNA Pre-anesthesia Checklist: Patient identified, Patient being monitored, Timeout performed, Emergency Drugs available and Suction available Patient Re-evaluated:Patient Re-evaluated prior to induction Oxygen Delivery Method: Circle system utilized Preoxygenation: Pre-oxygenation with 100% oxygen Induction Type: IV induction Ventilation: Mask ventilation without difficulty LMA: LMA inserted LMA Size: 4.0 Tube type: Oral Number of attempts: 1 Placement Confirmation: positive ETCO2 and breath sounds checked- equal and bilateral Tube secured with: Tape Dental Injury: Teeth and Oropharynx as per pre-operative assessment

## 2021-03-27 NOTE — Transfer of Care (Signed)
Immediate Anesthesia Transfer of Care Note  Patient: Felicia Chase  Procedure(s) Performed: DILATATION AND CURETTAGE /HYSTEROSCOPY (Uterus)  Patient Location: PACU  Anesthesia Type:General  Level of Consciousness: awake  Airway & Oxygen Therapy: Patient Spontanous Breathing  Post-op Assessment: Report given to RN  Post vital signs: stable  Last Vitals:  Vitals Value Taken Time  BP 138/86 03/27/21 1311  Temp    Pulse 88 03/27/21 1313  Resp 23 03/27/21 1313  SpO2 98 % 03/27/21 1313  Vitals shown include unvalidated device data.  Last Pain:  Vitals:   03/27/21 1116  TempSrc:   PainSc: 0-No pain         Complications: No notable events documented.

## 2021-03-27 NOTE — Op Note (Signed)
PREOPERATIVE DIAGNOSIS:  PMB POSTOPERATIVE DIAGNOSIS: SAA PROCEDURE: Hysteroscopy, Dilation and Curettage. SURGEON:  Arlina Robes, MD   INDICATIONS: 60 y.o. G2P0011  here for scheduled surgery for the aforementioned diagnoses.   Risks of surgery were discussed with the patient including but not limited to: bleeding which may require transfusion; infection which may require antibiotics; injury to uterus or surrounding organs; intrauterine scarring which may impair future fertility; need for additional procedures including laparotomy or laparoscopy; and other postoperative/anesthesia complications. Written informed consent was obtained.    FINDINGS:  A 8 week size uterus.  Diffuse proliferative endometrium.  ANESTHESIA:   General, paracervical block with 10 ml of 0.5% Marcaine INTRAVENOUS FLUIDS:  As recorded FLUID DEFICITS:  10 ml of NS ESTIMATED BLOOD LOSS:  Less than 20 ml SPECIMENS: Endometrial curettings sent to pathology COMPLICATIONS:  None immediate.  PROCEDURE DETAILS:  The patient received intravenous antibiotics while in the preoperative area.  She was then taken to the operating room where general anesthesia was administered and was found to be adequate.  After an adequate timeout was performed, she was placed in the dorsal lithotomy position and examined; then prepped and draped in the sterile manner.   Her bladder was catheterized for an unmeasured amount of clear, yellow urine. A speculum was then placed in the patient's vagina and a single tooth tenaculum was applied to the anterior lip of the cervix.   A paracervical block using 10 ml of 0.5% Marcaine was administered.  The cervix was sounded to 8 cm and dilated manually with metal dilators to accommodate the 5 mm diagnostic hysteroscope.  Once the cervix was dilated, the hysteroscope was inserted under direct visualization using NS as a suspension medium.  The uterine cavity was carefully examined with the findings as noted above.    After further careful visualization of the uterine cavity, the hysteroscope was removed under direct visualization.  A sharp curettage was then performed to obtain a moderate amount of endometrial curettings.  The tenaculum was removed from the anterior lip of the cervix and the vaginal speculum was removed after noting good hemostasis.  The patient tolerated the procedure well and was taken to the recovery area awake, extubated and in stable condition.  The patient will be discharged to home as per PACU criteria.  Routine postoperative instructions given.  She was prescribed Percocet and Ibuprofen

## 2021-03-27 NOTE — Anesthesia Postprocedure Evaluation (Signed)
Anesthesia Post Note  Patient: Felicia Chase  Procedure(s) Performed: DILATATION AND CURETTAGE /HYSTEROSCOPY (Uterus)     Patient location during evaluation: PACU Anesthesia Type: General Level of consciousness: awake and alert Pain management: pain level controlled Vital Signs Assessment: post-procedure vital signs reviewed and stable Respiratory status: spontaneous breathing, nonlabored ventilation, respiratory function stable and patient connected to nasal cannula oxygen Cardiovascular status: blood pressure returned to baseline and stable Postop Assessment: no apparent nausea or vomiting Anesthetic complications: no   No notable events documented.  Last Vitals:  Vitals:   03/27/21 1118 03/27/21 1310  BP: (!) 167/86 138/86  Pulse:  85  Resp:  (!) 24  Temp:  36.6 C  SpO2:  99%    Last Pain:  Vitals:   03/27/21 1116  TempSrc:   PainSc: 0-No pain                 Lise Pincus S

## 2021-03-27 NOTE — H&P (Signed)
Felicia Chase is an 60 y.o. female with PMB. GYN U/S revealed thicken endometrium. EMBX recommended to pt. Pt desires to have in out pt surgery setting. Hysteroscopy D & C reviewed .   Menstrual History: Menarche age: 29 No LMP recorded. Patient is postmenopausal.    Past Medical History:  Diagnosis Date   Allergy    Arthritis    back and ankle   Borderline diabetes 12/23/2017   GERD (gastroesophageal reflux disease)    H/O seasonal allergies    Hiatal hernia    Hypertension     Past Surgical History:  Procedure Laterality Date   ANKLE SURGERY Left    CHOLECYSTECTOMY     TONSILLECTOMY     TUBAL LIGATION      Family History  Problem Relation Age of Onset   Breast cancer Mother    Breast cancer Maternal Aunt    Hypertension Maternal Grandmother    Diabetes Maternal Aunt    Colon cancer Neg Hx    Rectal cancer Neg Hx    Esophageal cancer Neg Hx    Stomach cancer Neg Hx     Social History:  reports that she has never smoked. She has never used smokeless tobacco. She reports current alcohol use. She reports that she does not use drugs.  Allergies:  Allergies  Allergen Reactions   Penicillins Anaphylaxis and Rash   Cefdinir [Cefdinir] Swelling   Amoxicillin Rash   Latex Rash   Nickel Hives and Rash   Vicodin [Hydrocodone-Acetaminophen] Rash    Facility-Administered Medications Prior to Admission  Medication Dose Route Frequency Provider Last Rate Last Admin   0.9 %  sodium chloride infusion  500 mL Intravenous Once Doran Stabler, MD       Medications Prior to Admission  Medication Sig Dispense Refill Last Dose   CLARITIN 10 MG tablet Take 10 mg by mouth daily.   03/27/2021 at 0930   cyclobenzaprine (FLEXERIL) 10 MG tablet Take 1 tablet (10 mg total) by mouth as needed for muscle spasms (back / ankle pain). 60 tablet 0 Past Week   ELDERBERRY PO Take 1 tablet by mouth daily.   Past Week   ibuprofen (ADVIL) 800 MG tablet Take 800 mg by mouth every 8  (eight) hours as needed for moderate pain.   Past Week   MELATONIN PO Take 1 tablet by mouth at bedtime as needed (sleep).   Past Week   metoprolol tartrate (LOPRESSOR) 25 MG tablet Take 25 mg by mouth 2 (two) times daily.   03/27/2021 at 0930   MIRALAX 17 GM/SCOOP powder Take 1 Container by mouth daily.   Past Week   omeprazole (PRILOSEC) 40 MG capsule Take 1 capsule (40 mg total) by mouth daily. 90 capsule 3 03/27/2021 at 0930   ZYRTEC ALLERGY 10 MG tablet Take 10 mg by mouth at bedtime.   03/26/2021   amLODipine (NORVASC) 5 MG tablet Take 1 tablet (5 mg total) by mouth daily. 90 tablet 3    flunisolide (NASALIDE) 25 MCG/ACT (0.025%) SOLN Place 1 spray into the nose 2 (two) times daily. 1 Bottle 3    meclizine (ANTIVERT) 25 MG tablet Take 1 tablet (25 mg total) by mouth 3 (three) times daily as needed for dizziness. 30 tablet 0    methocarbamol (ROBAXIN) 500 MG tablet Take 1 tablet (500 mg total) by mouth 2 (two) times daily. 20 tablet 0    metoprolol tartrate (LOPRESSOR) 50 MG tablet Take 1 tablet (50 mg total)  by mouth 2 (two) times daily. (Patient not taking: No sig reported) 180 tablet 3 Not Taking    Review of Systems  Constitutional: Negative.   Respiratory: Negative.    Cardiovascular: Negative.   Gastrointestinal: Negative.   Genitourinary:  Positive for menstrual problem.   Blood pressure (!) 167/86, pulse 77, temperature (!) 97.1 F (36.2 C), temperature source Oral, resp. rate 18, height 5' 7.5" (1.715 m), weight 110.2 kg, SpO2 100 %. Physical Exam Constitutional:      Appearance: Normal appearance.  Cardiovascular:     Rate and Rhythm: Normal rate and regular rhythm.  Pulmonary:     Effort: Pulmonary effort is normal.     Breath sounds: Normal breath sounds.  Abdominal:     General: Bowel sounds are normal.     Palpations: Abdomen is soft.  Genitourinary:    Comments: Nl EGBUS, Small mobile uterus, no masses or tenderness Neurological:     Mental Status: She is alert.     Results for orders placed or performed during the hospital encounter of 03/27/21 (from the past 24 hour(s))  Pregnancy, urine POC     Status: None   Collection Time: 03/27/21 11:04 AM  Result Value Ref Range   Preg Test, Ur NEGATIVE NEGATIVE    No results found.  Assessment/Plan: PMB in setting of thicken endometrium on GYN U/S.  Hysteroscopy D & C reviewed with pt. R/B/post op care discussed. Pt verbalized understanding and agrees to proceed.   Chancy Milroy 03/27/2021, 11:21 AM

## 2021-03-28 ENCOUNTER — Encounter (HOSPITAL_COMMUNITY): Payer: Self-pay | Admitting: Obstetrics and Gynecology

## 2021-03-28 LAB — SURGICAL PATHOLOGY

## 2021-04-06 ENCOUNTER — Telehealth: Payer: Self-pay | Admitting: *Deleted

## 2021-04-06 DIAGNOSIS — N898 Other specified noninflammatory disorders of vagina: Secondary | ICD-10-CM

## 2021-04-06 MED ORDER — FLUCONAZOLE 150 MG PO TABS: 150.0000 mg | ORAL_TABLET | Freq: Once | ORAL | 0 refills | Status: AC

## 2021-04-06 NOTE — Telephone Encounter (Signed)
Felicia Chase left a voicemail this am that she had surgery with  Dr. Rip Harbour . States she is having some vaginal issues and would like a call . Shazia Mitchener,RN I called her and she reports it feels itchy like a yeast infection with white/ cream vaginal discharge.  She denies fever , pain or other discharge. I informed her per protocol I can send in RX for Diflucan for suspected yeast infection. I informed her if this does not relieve her symptoms to call our office and schedule nurse visit to do self swab to check for other infections. She voices understanding. Brynnley Dayrit,RN

## 2021-04-23 ENCOUNTER — Ambulatory Visit (INDEPENDENT_AMBULATORY_CARE_PROVIDER_SITE_OTHER): Payer: No Typology Code available for payment source | Admitting: Obstetrics and Gynecology

## 2021-04-23 ENCOUNTER — Other Ambulatory Visit (HOSPITAL_COMMUNITY)
Admission: RE | Admit: 2021-04-23 | Discharge: 2021-04-23 | Disposition: A | Discharge status: Home / Self Care | Payer: No Typology Code available for payment source | Source: Ambulatory Visit | Attending: Obstetrics and Gynecology | Admitting: Obstetrics and Gynecology

## 2021-04-23 ENCOUNTER — Encounter: Payer: Self-pay | Admitting: Obstetrics and Gynecology

## 2021-04-23 ENCOUNTER — Other Ambulatory Visit: Payer: Self-pay

## 2021-04-23 VITALS — BP 156/82 | HR 80 | Wt 247.3 lb

## 2021-04-23 DIAGNOSIS — Z9889 Other specified postprocedural states: Secondary | ICD-10-CM | POA: Insufficient documentation

## 2021-04-23 DIAGNOSIS — N898 Other specified noninflammatory disorders of vagina: Secondary | ICD-10-CM | POA: Insufficient documentation

## 2021-04-23 DIAGNOSIS — N950 Postmenopausal bleeding: Secondary | ICD-10-CM

## 2021-04-23 DIAGNOSIS — N951 Menopausal and female climacteric states: Secondary | ICD-10-CM

## 2021-04-23 MED ORDER — VENLAFAXINE HCL ER 37.5 MG PO CP24: 75.0000 mg | ORAL_CAPSULE | Freq: Every day | ORAL | 5 refills | Status: DC

## 2021-04-23 NOTE — Progress Notes (Signed)
Pt called and states health department could not fill rx. Requests this be sent to [facility] in [facility]. Reordered per Rip Harbour, MD.

## 2021-04-23 NOTE — Progress Notes (Signed)
Felicia Chase is here for post op. S/P Hysteroscopy D & C for PMB in setting of thicken endometrium. Pathology reviewed with pt. Benign polyp Occ spotting since surgery She also reports problems with chronic vaginal odor/discharge Menopausal Sx as well Not sexual active at present  PE AF VSS Lungs clear Heart RRR Abd soft + BS  A/P Post op        Menopausal Sx        Chronic vaginal odor/discharge  Discussed HRT for bleeding and menopausal Sx. Pt with + FH of breast CA Declines today. Will start Effexor for menopausal Sx. Following bleeding pattern for now. Self swab and tx as per results. F/U in 2 months to gage response to Effexor and check bleeding pattern

## 2021-04-23 NOTE — Patient Instructions (Signed)
Health Maintenance, Female Adopting a healthy lifestyle and getting preventive care are important in promoting health and wellness. Ask your health care provider about: The right schedule for you to have regular tests and exams. Things you can do on your own to prevent diseases and keep yourself healthy. What should I know about diet, weight, and exercise? Eat a healthy diet  Eat a diet that includes plenty of vegetables, fruits, low-fat dairy products, and lean protein. Do not eat a lot of foods that are high in solid fats, added sugars, or sodium.  Maintain a healthy weight Body mass index (BMI) is used to identify weight problems. It estimates body fat based on height and weight. Your health care provider can help determineyour BMI and help you achieve or maintain a healthy weight. Get regular exercise Get regular exercise. This is one of the most important things you can do for your health. Most adults should: Exercise for at least 150 minutes each week. The exercise should increase your heart rate and make you sweat (moderate-intensity exercise). Do strengthening exercises at least twice a week. This is in addition to the moderate-intensity exercise. Spend less time sitting. Even light physical activity can be beneficial. Watch cholesterol and blood lipids Have your blood tested for lipids and cholesterol at 60 years of age, then havethis test every 5 years. Have your cholesterol levels checked more often if: Your lipid or cholesterol levels are high. You are older than 60 years of age. You are at high risk for heart disease. What should I know about cancer screening? Depending on your health history and family history, you may need to have cancer screening at various ages. This may include screening for: Breast cancer. Cervical cancer. Colorectal cancer. Skin cancer. Lung cancer. What should I know about heart disease, diabetes, and high blood pressure? Blood pressure and heart  disease High blood pressure causes heart disease and increases the risk of stroke. This is more likely to develop in people who have high blood pressure readings, are of African descent, or are overweight. Have your blood pressure checked: Every 3-5 years if you are 18-39 years of age. Every year if you are 40 years old or older. Diabetes Have regular diabetes screenings. This checks your fasting blood sugar level. Have the screening done: Once every three years after age 40 if you are at a normal weight and have a low risk for diabetes. More often and at a younger age if you are overweight or have a high risk for diabetes. What should I know about preventing infection? Hepatitis B If you have a higher risk for hepatitis B, you should be screened for this virus. Talk with your health care provider to find out if you are at risk forhepatitis B infection. Hepatitis C Testing is recommended for: Everyone born from 1945 through 1965. Anyone with known risk factors for hepatitis C. Sexually transmitted infections (STIs) Get screened for STIs, including gonorrhea and chlamydia, if: You are sexually active and are younger than 60 years of age. You are older than 60 years of age and your health care provider tells you that you are at risk for this type of infection. Your sexual activity has changed since you were last screened, and you are at increased risk for chlamydia or gonorrhea. Ask your health care provider if you are at risk. Ask your health care provider about whether you are at high risk for HIV. Your health care provider may recommend a prescription medicine to help   prevent HIV infection. If you choose to take medicine to prevent HIV, you should first get tested for HIV. You should then be tested every 3 months for as long as you are taking the medicine. Pregnancy If you are about to stop having your period (premenopausal) and you may become pregnant, seek counseling before you get  pregnant. Take 400 to 800 micrograms (mcg) of folic acid every day if you become pregnant. Ask for birth control (contraception) if you want to prevent pregnancy. Osteoporosis and menopause Osteoporosis is a disease in which the bones lose minerals and strength with aging. This can result in bone fractures. If you are 65 years old or older, or if you are at risk for osteoporosis and fractures, ask your health care provider if you should: Be screened for bone loss. Take a calcium or vitamin D supplement to lower your risk of fractures. Be given hormone replacement therapy (HRT) to treat symptoms of menopause. Follow these instructions at home: Lifestyle Do not use any products that contain nicotine or tobacco, such as cigarettes, e-cigarettes, and chewing tobacco. If you need help quitting, ask your health care provider. Do not use street drugs. Do not share needles. Ask your health care provider for help if you need support or information about quitting drugs. Alcohol use Do not drink alcohol if: Your health care provider tells you not to drink. You are pregnant, may be pregnant, or are planning to become pregnant. If you drink alcohol: Limit how much you use to 0-1 drink a day. Limit intake if you are breastfeeding. Be aware of how much alcohol is in your drink. In the [place] one drink equals one 12 oz bottle of beer (355 mL), one 5 oz glass of wine (148 mL), or one 1 oz glass of hard liquor (44 mL). General instructions Schedule regular health, dental, and eye exams. Stay current with your vaccines. Tell your health care provider if: You often feel depressed. You have ever been abused or do not feel safe at home. Summary Adopting a healthy lifestyle and getting preventive care are important in promoting health and wellness. Follow your health care provider's instructions about healthy diet, exercising, and getting tested or screened for diseases. Follow your health care provider's  instructions on monitoring your cholesterol and blood pressure. This information is not intended to replace advice given to you by your health care provider. Make sure you discuss any questions you have with your healthcare provider. Document Revised: 08/26/2018 Document Reviewed: 08/26/2018 Elsevier Patient Education  2022 Elsevier Inc.  

## 2021-04-23 NOTE — Addendum Note (Signed)
Addended by: Annabell Howells on: 04/23/2021 04:48 PM   Modules accepted: Orders

## 2021-04-24 ENCOUNTER — Telehealth: Payer: Self-pay

## 2021-04-24 DIAGNOSIS — N951 Menopausal and female climacteric states: Secondary | ICD-10-CM

## 2021-04-24 LAB — CERVICOVAGINAL ANCILLARY ONLY
Bacterial Vaginitis (gardnerella): NEGATIVE
Candida Glabrata: NEGATIVE
Candida Vaginitis: NEGATIVE
Chlamydia: NEGATIVE
Comment: NEGATIVE
Comment: NEGATIVE
Comment: NEGATIVE
Comment: NEGATIVE
Comment: NEGATIVE
Comment: NORMAL
Neisseria Gonorrhea: NEGATIVE
Trichomonas: NEGATIVE

## 2021-04-24 NOTE — Telephone Encounter (Signed)
Pt left VM on nurse line this AM stating she needs Effexor to be sent to a different pharmacy. Called pt back. Pt states she needs this sent to pharmacy other than [facility] since they do not carry this. Explained I have sent to her new pharmacy. Pt is concerned about cost, does not have insurance coverage. [facility] charges $15 for 30 tablets, both 37.5 and 75 mg. This cost is lower than any Good Rx discount. Message sent to [facility] MD with request to alter rx for 75 mg tablets to lower monthly cost for patient.

## 2021-04-25 ENCOUNTER — Telehealth: Payer: Self-pay | Admitting: *Deleted

## 2021-04-25 DIAGNOSIS — N710 Acute inflammatory disease of uterus: Secondary | ICD-10-CM

## 2021-04-25 MED ORDER — DOXYCYCLINE HYCLATE 100 MG PO CAPS: 100.0000 mg | ORAL_CAPSULE | Freq: Two times a day (BID) | ORAL | 0 refills | Status: AC

## 2021-04-25 NOTE — Telephone Encounter (Signed)
-----   Message from Chancy Milroy, MD sent at 04/25/2021 11:10 AM EDT ----- Please send Rx for Doxycycline 100 mg po bid x 10 days. Pt is aware. Thanks Legrand Como

## 2021-04-25 NOTE — Telephone Encounter (Signed)
I called patient to notify of Rx and asked which pharmacy to send it to. She voices understanding. She asked me also to ask Apolonio Schneiders to follow up with her re: Effexor. I talked with Apolonio Schneiders and she will follow up with patient once she hears back from Dr. Rip Harbour. Jacques Navy

## 2021-04-26 MED ORDER — VENLAFAXINE HCL ER 37.5 MG PO CP24: 37.5000 mg | ORAL_CAPSULE | Freq: Every day | ORAL | 5 refills | Status: DC

## 2021-04-26 NOTE — Telephone Encounter (Addendum)
Message received from [place], MD stating prescription should be for 1 37.5 mg tablet daily. Rx resent to [facility]. Pt notified.

## 2021-06-27 ENCOUNTER — Ambulatory Visit: Payer: No Typology Code available for payment source | Admitting: Obstetrics and Gynecology

## 2021-09-04 ENCOUNTER — Other Ambulatory Visit: Payer: Self-pay

## 2021-09-04 ENCOUNTER — Ambulatory Visit: Payer: Self-pay | Admitting: *Deleted

## 2021-09-04 VITALS — BP 124/86 | Wt 247.2 lb

## 2021-09-04 DIAGNOSIS — Z1211 Encounter for screening for malignant neoplasm of colon: Secondary | ICD-10-CM

## 2021-09-04 DIAGNOSIS — Z01419 Encounter for gynecological examination (general) (routine) without abnormal findings: Secondary | ICD-10-CM

## 2021-09-04 NOTE — Progress Notes (Signed)
[name] is a 60 y.o. [id number] female who presents to [facility] clinic today with complaint of PMB. Patient has been followed by the Felicia Chase at the [facility] for [place]. Patient stated she has had occasional spotting since procedure 03/27/2021. Advised patient to follow-up with Felicia Chase. Patient concerned about cost and stated she does have the [place] Application. Explained the importance of completing application and follow-up. Let patient know that [facility] will not cover the follow-up.  Patient referred to due to [place] due to last left breast diagnostic mammogram and ultrasound was completed 03/21/2021 that was bi-rads 3 with an annual bilateral diagnostic mammogram recommended late December 2022.   Pap Smear: Pap smear completed today. Last Pap smear was 08/29/2020 at [facility] clinic and was normal with negative HPV. Patient has history of an abnormal Pap smear 06/17/2019 that was ASCUS with positive HPV that had a colposcopy to follow-up 08/03/2019 that was benign. Per patient has a history of one other abnormal Pap smear in the 1980's that cryotherapy was completed for follow-up. Last Pap smear and colposcopy result is available in Epic.   Physical exam: Breasts Breasts symmetrical. No skin abnormalities left breast. Observed a keloid right inner breast next to the center of the chest that per patient had a infected hair follicle that healed. No nipple retraction bilateral breasts. No nipple discharge bilateral breasts. No lymphadenopathy. No lumps palpated bilateral breasts. No complaints of pain or tenderness on exam.  MS DIGITAL SCREENING BILATERAL  Result Date: 02/05/2017 CLINICAL DATA:  Screening. EXAM: DIGITAL SCREENING BILATERAL MAMMOGRAM WITH CAD COMPARISON:  Previous exam(s). ACR Breast Density Category b: There are scattered areas of fibroglandular density. FINDINGS: There are no findings suspicious for malignancy. Images were processed  with CAD. IMPRESSION: No mammographic evidence of malignancy. A result letter of this screening mammogram will be mailed directly to the patient. RECOMMENDATION: Screening mammogram in one year. (Code:SM-B-01Y) BI-RADS CATEGORY  1: Negative. Electronically Signed   By: Felicia Olp M.D.   On: 02/05/2017 12:57   MM DIAG BREAST TOMO UNI LEFT  Result Date: 03/21/2021 CLINICAL DATA:  Short-term interval follow-up of likely benign masses involving the LOWER OUTER QUADRANT of the LEFT breast at the 4 o'clock position, likely clustered cysts/apocrine metaplasia. EXAM: DIGITAL DIAGNOSTIC UNILATERAL LEFT MAMMOGRAM WITH TOMOSYNTHESIS AND CAD; ULTRASOUND LEFT BREAST LIMITED TECHNIQUE: Left digital diagnostic mammography and breast tomosynthesis was performed. The images were evaluated with computer-aided detection.; Targeted ultrasound examination of the left breast was performed COMPARISON:  Previous exam(s). ACR Breast Density Category b: There are scattered areas of fibroglandular density. FINDINGS: Full field CC and MLO views were obtained. The isodense mass with lobular margins in the LOWER OUTER QUADRANT at posterior depth is unchanged, measuring approximately 9 mm. There is no associated architectural distortion or suspicious calcifications. The isodense mass with lobular margins in the LOWER OUTER QUADRANT at middle depth is less conspicuous on the current mammogram. There is no associated architectural distortion or suspicious calcifications. No new or suspicious findings elsewhere. Targeted ultrasound is performed, demonstrating that 1 of the 2 adjacent benign clustered cysts at the 4 o'clock position approximately 4 cm from the nipple has resolved in the interval, and the more superficial clustered cysts have decreased in size, currently measuring 4 x 2 x 2 mm (previously 5 x 2 x 4 mm on the 09/19/2020 ultrasound). A sonographic correlate for the isodense mass in the LOWER OUTER QUADRANT at posterior depth was  not identified.  IMPRESSION: 1. One of the adjacent benign clustered cysts in the LOWER OUTER QUADRANT of the LEFT breast at middle depth has resolved since the prior examination 6 months ago and the other has decreased in size, confirming benignity. 2. Stable likely benign 9 mm mass in the LOWER OUTER QUADRANT of the LEFT breast at posterior depth which does not have a sonographic correlate. RECOMMENDATION: Annual BILATERAL diagnostic mammography which is due in late December, 2022 (in order to confirm stability of the likely benign mass in the LOWER OUTER QUADRANT of the LEFT breast at posterior depth). I have discussed the findings and recommendations with the patient. If applicable, a reminder letter will be sent to the patient regarding the next appointment. BI-RADS CATEGORY  3: Probably benign. Electronically Signed   By: Felicia Dakin M.D.   On: 03/21/2021 14:59  MM 3D SCREEN BREAST BILATERAL  Result Date: 02/10/2018 CLINICAL DATA:  Screening. EXAM: DIGITAL SCREENING BILATERAL MAMMOGRAM WITH TOMO AND CAD COMPARISON:  Previous exam(s). ACR Breast Density Category b: There are scattered areas of fibroglandular density. FINDINGS: There are no findings suspicious for malignancy. Images were processed with CAD. IMPRESSION: No mammographic evidence of malignancy. A result letter of this screening mammogram will be mailed directly to the patient. RECOMMENDATION: Screening mammogram in one year. (Code:SM-B-01Y) BI-RADS CATEGORY  1: Negative. Electronically Signed   By: Felicia Manes M.D.   On: 02/10/2018 07:50   MS DIGITAL SCREENING TOMO BILATERAL  Result Date: 09/05/2020 CLINICAL DATA:  Screening. EXAM: DIGITAL SCREENING BILATERAL MAMMOGRAM WITH TOMO AND CAD COMPARISON:  Previous exam(s). ACR Breast Density Category b: There are scattered areas of fibroglandular density. FINDINGS: In the left breast, possible new masses warrant further evaluation. In the right breast, no findings suspicious for  malignancy. Images were processed with CAD. IMPRESSION: Further evaluation is suggested for possible masses in the left breast. RECOMMENDATION: Diagnostic mammogram and possibly ultrasound of the left breast. (Code:FI-[id number]) The patient will be contacted regarding the findings, and additional imaging will be scheduled. BI-RADS CATEGORY  0: Incomplete. Need additional imaging evaluation and/or prior mammograms for comparison. Electronically Signed   By: Felicia Dakin M.D.   On: 09/05/2020 09:53   MS DIGITAL SCREENING TOMO BILATERAL  Result Date: 07/10/2019 CLINICAL DATA:  Screening. EXAM: DIGITAL SCREENING BILATERAL MAMMOGRAM WITH TOMO AND CAD COMPARISON:  Previous exam(s). ACR Breast Density Category b: There are scattered areas of fibroglandular density. FINDINGS: There are no findings suspicious for malignancy. Images were processed with CAD. IMPRESSION: No mammographic evidence of malignancy. A result letter of this screening mammogram will be mailed directly to the patient. RECOMMENDATION: Screening mammogram in one year. (Code:SM-B-01Y) BI-RADS CATEGORY  1: Negative. Electronically Signed   By: Ammie Ferrier M.D.   On: 07/10/2019 10:16   MS DIGITAL DIAG TOMO UNI LEFT  Result Date: 09/19/2020 CLINICAL DATA:  Screening recall for possible masses in the left breast. EXAM: DIGITAL DIAGNOSTIC UNILATERAL LEFT MAMMOGRAM WITH TOMO AND CAD; ULTRASOUND LEFT BREAST LIMITED COMPARISON:  Previous exam(s). ACR Breast Density Category b: There are scattered areas of fibroglandular density. FINDINGS: Spot compression tomosynthesis images through the lower outer middle depth of the left breast demonstrates 2 adjacent oval masses measuring 1 cm and 0.5 cm respectively. No definite mass is seen in the area of concern in the lateral to upper outer far posterior left breast, however ultrasound will be performed for further evaluation. Mammographic images were processed with CAD. Ultrasound of the left breast at 4  o'clock, 4 cm from the  nipple demonstrates 2 adjacent hypoechoic oval masses. Both demonstrate cystic spaces and are favored to represent apocrine metaplasia/fibrocystic change. The smaller of these measures 0.5 x 0.2 x 0.3 cm, and the other larger measures 0.8 x 0.4 x 0.6 cm. Ultrasound targeted to the left breast at 2 o'clock, 9 cm from the nipple demonstrates an anechoic thin oval mass with circumscribed margins measuring 0.5 x 0.2 x 0.5 cm, compatible with a benign cyst. Ultrasound of the left axilla demonstrates multiple normal-appearing lymph nodes. IMPRESSION: 1. There are 2 likely benign masses in the left breast at 4 o'clock, favored to represent apocrine metaplasia/fibrocystic change. 2.  There is a 5 mm benign cyst in the left breast at 2 o'clock. 3.  No abnormal left axillary lymph nodes. RECOMMENDATION: Six-month follow-up diagnostic left breast mammogram and ultrasound is recommended to monitor the 2 likely benign adjacent masses in the left breast at 4 o'clock. I have discussed the findings and recommendations with the patient. If applicable, a reminder letter will be sent to the patient regarding the next appointment. BI-RADS CATEGORY  3: Probably benign. Electronically Signed   By: Ammie Ferrier M.D.   On: 09/19/2020 15:58         Pelvic/Bimanual Ext Genitalia No lesions, no swelling and no discharge observed on external genitalia.        Vagina Vagina pink and normal texture. No lesions or discharge observed in vagina.        Cervix Cervix is present. Cervix pink and of normal texture. No discharge observed.    Uterus Uterus is present and palpable. Uterus in normal position and normal size.        Adnexae Bilateral ovaries present and palpable. No tenderness on palpation.         Rectovaginal No rectal exam completed today since patient had no rectal complaints. No skin abnormalities observed on exam.     Smoking History: Patient has never smoked.   Patient  Navigation: Patient education provided. Access to services provided for patient through [facility] program.   Colorectal Cancer Screening: Per patient has had colonoscopy completed on 01/26/2018 at [facility].  FIT Test given to patient to complete. No complaints today.    Breast and Cervical Cancer Risk Assessment: Patient has a family history of her mother and a maternal aunt having breast cancer. Patient has no known genetic mutations or history of radiation treatment to the chest before age 68. Patient has a history of cervical dysplasia. Patient has no history of being immunocompromised or DES exposure in-utero.  Risk Assessment     Risk Scores       09/04/2021 08/29/2020   Last edited by: Demetrius Revel, LPN McGill, Sherie Mamie Nick, LPN   5-year risk: 2.3 % 2.3 %   Lifetime risk: 10.8 % 11.1 %            A: BCCCP exam with pap smear Complaint of PMB.  P: Referred patient to the [facility] for a diagnostic mammogram per recommendation. Appointment scheduled Monday, September 24, 2021 at 1240.  Loletta Parish, RN 09/04/2021 10:38 AM

## 2021-09-04 NOTE — Patient Instructions (Signed)
Explained breast self awareness with [name]. Pap smear completed today. Let her know that if today's Pap smear is normal and HPV negative that her next Pap smear would be due in one year due to her history of abnormal Pap smear. Referred patient to the [facility] for a diagnostic mammogram per recommendation. Appointment scheduled Monday, September 24, 2021 at 1240. Patient aware of appointment and will be there. Let patient know will follow up with her within the next couple weeks with results of her Pap smear by phone. Felicia Chase verbalized understanding.  Felicia Chase, Arvil Chaco, RN 10:38 AM

## 2021-09-06 ENCOUNTER — Telehealth: Payer: Self-pay

## 2021-09-06 LAB — CYTOLOGY - PAP
Comment: NEGATIVE
Diagnosis: NEGATIVE
High risk HPV: NEGATIVE

## 2021-09-06 NOTE — Telephone Encounter (Signed)
Called patient to give pap smear results. Informed patient that pap smear was normal and HPV was negative. Based on this result her next pap smear will be due in 1 year due to previous abnormal. Patient voiced understanding.

## 2021-09-12 ENCOUNTER — Telehealth: Payer: Self-pay | Admitting: General Practice

## 2021-09-12 NOTE — Telephone Encounter (Signed)
Patient called and left message on nurse voicemail line stating she had a D&C with Dr Rip Harbour back in July but she is still having spotting off/on.   Called patient and she states she has been spotting since July off and on, currently she has been spotting for the past week. She is concerned as this doesn't seem normal to her. Discussed coming in for a follow up appt with [facility]. Patient voiced concern over not working and not having insurance at this time. Discussed filling out [facility] financial application for assistance and she can use food stamps receipts as proof of income. Patient states she recently signed up for [employer] and that should be kicking in this month or next month. Told patient that is fine and I believe we accept that insurance plan. Patient voiced concern about co-pay since she isn't working. Told patient she can just pay what she can towards the co pay. Patient verbalized understanding & will await phone call for appt.

## 2021-09-21 ENCOUNTER — Encounter: Payer: Self-pay | Admitting: Obstetrics and Gynecology

## 2021-09-21 ENCOUNTER — Other Ambulatory Visit: Payer: Self-pay

## 2021-09-21 ENCOUNTER — Ambulatory Visit (INDEPENDENT_AMBULATORY_CARE_PROVIDER_SITE_OTHER): Payer: 59 | Admitting: Obstetrics and Gynecology

## 2021-09-21 VITALS — BP 160/92 | HR 76 | Wt 248.4 lb

## 2021-09-21 DIAGNOSIS — N950 Postmenopausal bleeding: Secondary | ICD-10-CM

## 2021-09-21 NOTE — Progress Notes (Signed)
Felicia Chase presents with episode of PMB from 09/08/21 to 09/16/21 Dark spotting. Some mild cramps  H/O PMB 4/22. U/S endometrial polyp. D & C 7/22 confirmed benign polyp. No bleeding until as noted above.  Pap 12/22 normal  Not sexual active  TSVD x 1 ( 7 # )  PE AF VSS Lungs clear Heart RRR Abd soft + BS  A/P PMB  Will check GYN U/S. F/U after U/S

## 2021-09-21 NOTE — Progress Notes (Signed)
Had D&C in July 2022. Last vaginal bleeding 12/24-09/16/21.

## 2021-09-21 NOTE — Patient Instructions (Signed)
Postmenopausal Bleeding Postmenopausal bleeding is any bleeding that occurs after menopause. Menopause is a time in a woman's life when monthly periods stop. Any type of bleeding after menopause should be checked by your doctor. Treatment will depend on the cause. This kind of bleeding can be caused by: Taking hormones during menopause. Low or high amounts of female hormones in the body. This can cause the lining of the womb (uterus) to become too thin or too thick. Cancer. Growths in the womb that are not cancer. Follow these instructions at home:  Watch for any changes in your symptoms. Let your doctor know about them. Avoid using tampons and douches as told by your doctor. Change your pads regularly. Get regular pelvic exams. This includes Pap tests. Take iron pills as told by your doctor. Take over-the-counter and prescription medicines only as told by your doctor. Keep all follow-up visits. Contact a doctor if: You have new bleeding from the vagina after menopause. You have pain in your belly (abdomen). Get help right away if: You have a fever or chills. You have very bad pain with bleeding. You have clumps of blood (blood clots) coming from your vagina. You have a lot of bleeding, and: You use more than 1 pad an hour. This kind of bleeding has never happened before. You have headaches. You feel dizzy or you feel like you are going to pass out (faint). Summary Any type of bleeding after menopause should be checked by your doctor. Avoid using tampons or douches. Get regular pelvic exams. This includes Pap tests. Contact a doctor if you have new bleeding or pain in your belly. Watch for any changes in your symptoms. Let your doctor know about them. This information is not intended to replace advice given to you by your health care provider. Make sure you discuss any questions you have with your health care provider. Document Revised: 02/17/2020 Document Reviewed:  02/17/2020 Elsevier Patient Education  [place].

## 2021-09-24 ENCOUNTER — Ambulatory Visit
Admission: RE | Admit: 2021-09-24 | Discharge: 2021-09-24 | Disposition: A | Discharge status: Home / Self Care | Payer: No Typology Code available for payment source | Source: Ambulatory Visit | Attending: Obstetrics and Gynecology | Admitting: Obstetrics and Gynecology

## 2021-09-24 DIAGNOSIS — R928 Other abnormal and inconclusive findings on diagnostic imaging of breast: Secondary | ICD-10-CM

## 2021-09-27 ENCOUNTER — Ambulatory Visit (HOSPITAL_COMMUNITY): Payer: 59
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
## 2021-10-17 ENCOUNTER — Ambulatory Visit (INDEPENDENT_AMBULATORY_CARE_PROVIDER_SITE_OTHER): Payer: 59

## 2021-10-17 ENCOUNTER — Ambulatory Visit (INDEPENDENT_AMBULATORY_CARE_PROVIDER_SITE_OTHER): Payer: 59 | Admitting: Nurse Practitioner

## 2021-10-17 ENCOUNTER — Encounter: Payer: Self-pay | Admitting: Nurse Practitioner

## 2021-10-17 ENCOUNTER — Other Ambulatory Visit: Payer: Self-pay

## 2021-10-17 VITALS — BP 138/90 | HR 85 | Temp 97.1°F | Ht 68.0 in | Wt 244.2 lb

## 2021-10-17 DIAGNOSIS — M5442 Lumbago with sciatica, left side: Secondary | ICD-10-CM | POA: Diagnosis not present

## 2021-10-17 DIAGNOSIS — G8929 Other chronic pain: Secondary | ICD-10-CM

## 2021-10-17 DIAGNOSIS — Z7689 Persons encountering health services in other specified circumstances: Secondary | ICD-10-CM

## 2021-10-17 MED ORDER — PREDNISONE 10 MG PO TABS: ORAL_TABLET | ORAL | 0 refills | Status: DC
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
## 2021-10-17 NOTE — Patient Instructions (Signed)
It was great to see you!  Start prednisone taper, 6 tablets today, 5 tomorrow, 4 the next day, then decrease by 1 tablet every day until gone. Take this in the morning with food.   We will update an x-ray of your back.   I placed a referral to Dr. Ellene Route with neurosurgery and PT.   Let's follow-up in 1 week for your chronic care management and bloodwork, sooner if you have concerns.  If a referral was placed today, you will be contacted for an appointment. Please note that routine referrals can sometimes take up to 3-4 weeks to process. Please call our office if you haven't heard anything after this time frame.  Take care,  Vance Peper, NP

## 2021-10-23 NOTE — Progress Notes (Signed)
Established Patient Office Visit  Subjective:  Patient ID: Felicia Chase, female    DOB: 07-25-1961  Age: 61 y.o. MRN: [id number]  CC:  Chief Complaint  Patient presents with   Follow-up    Pt is here for HTN f/u and ear cleaning. Pt is fasting.     HPI Felicia Chase presents for follow up on hypertension. Endorses bilateral ear clogged sensation.   HYPERTENSION  Hypertension status: controlled  Satisfied with current treatment? yes Duration of hypertension: chronic BP monitoring frequency:  daily BP range: 130s/80s BP medication side effects:  no Medication compliance: excellent compliance Previous BP meds: metoprolol Aspirin: no Recurrent headaches: no Visual changes: no Palpitations: no Dyspnea: no Chest pain: no Lower extremity edema: no Dizzy/lightheaded: yes - thinks it's from her ears being clogged  EAR CLOGGED  Duration: weeks Involved ear(s):  bilateral Sensation of feeling clogged/plugged: yes Decreased/muffled hearing:yes Ear pain: no Fever: no Otorrhea: no Hearing loss: yes Upper respiratory infection symptoms: no Using Q-Tips: yes Status: worse History of cerumenosis: yes Treatments attempted:  Debrox   Past Medical History:  Diagnosis Date   Allergy    Arthritis    back and ankle   Borderline diabetes 12/23/2017   GERD (gastroesophageal reflux disease)    H/O seasonal allergies    Hiatal hernia    Hypertension     Past Surgical History:  Procedure Laterality Date   ANKLE SURGERY Left    CHOLECYSTECTOMY     HYSTEROSCOPY WITH D & C N/A 03/27/2021   Procedure: DILATATION AND CURETTAGE /HYSTEROSCOPY;  Surgeon: Chancy Milroy, MD;  Location: [place] [facility];  Service: Gynecology;  Laterality: N/A;   TONSILLECTOMY     TUBAL LIGATION      Family History  Problem Relation Age of Onset   Cancer Mother        breast   Breast cancer Mother    Breast cancer Maternal Aunt    Diabetes Maternal Aunt    Hypertension Maternal  Grandmother    Colon cancer Neg Hx    Rectal cancer Neg Hx    Esophageal cancer Neg Hx    Stomach cancer Neg Hx     Social History   Socioeconomic History   Marital status: Single    Spouse name: Not on file   Number of children: 1   Years of education: Not on file   Highest education level: Bachelor's degree (e.g., BA, AB, BS)  Occupational History   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   Tobacco Use   Smoking status: Never   Smokeless tobacco: Never  Vaping Use   Vaping Use: Never used  Substance and Sexual Activity   Alcohol use: Yes    Comment: occasional wine   Drug use: No   Sexual activity: Not Currently    Birth control/protection: Surgical  Other Topics Concern   Not on file  Social History Narrative   Has one son    Social Determinants of Health   Financial Resource Strain: Not on file  Food Insecurity: No Food Insecurity   Worried About Charity fundraiser in the Last Year: Never true   Ran Out of Food in the Last Year: Never true  Transportation Needs: No Transportation Needs   Lack of Transportation (Medical): No   Lack of Transportation (Non-Medical): No  Physical Activity: Not on file  Stress: Not on file  Social Connections: Not on file  Intimate Partner Violence: Not on file    Outpatient Medications Prior to  Visit  Medication Sig Dispense Refill   Bioflavonoid Products ([place]) TABS See admin instructions.     cyclobenzaprine (FLEXERIL) 10 MG tablet Take 1 tablet (10 mg total) by mouth as needed for muscle spasms (back / ankle pain). 60 tablet 0   ELDERBERRY PO Take 1 tablet by mouth daily.     flunisolide (NASALIDE) 25 MCG/ACT (0.025%) SOLN Place 1 spray into the nose 2 (two) times daily. 1 Bottle 3   ibuprofen (ADVIL) 800 MG tablet Take 1 tablet (800 mg total) by mouth every 8 (eight) hours as needed. 30 tablet 0   MELATONIN PO Take 1 tablet by mouth at bedtime as needed (sleep).     metoprolol tartrate (LOPRESSOR) 25 MG tablet Take 25 mg by mouth 2 (two)  times daily.     MIRALAX 17 GM/SCOOP powder Take 1 Container by mouth daily.     omeprazole (PRILOSEC) 40 MG capsule Take 1 capsule (40 mg total) by mouth daily. 90 capsule 3   predniSONE (DELTASONE) 10 MG tablet Take 6 tablets today, then 5 tablets tomorrow, then decrease by 1 tablet every day until gone 21 tablet 0   SINGULAIR 10 MG tablet Take 10 mg by mouth daily.     ZYRTEC ALLERGY 10 MG tablet Take 10 mg by mouth at bedtime.     No facility-administered medications prior to visit.    Allergies  Allergen Reactions   Cefdinir Swelling   Penicillins Anaphylaxis and Rash   Cefdinir [Cefdinir] Swelling   Amoxicillin Rash   Latex Rash   Nickel Hives and Rash   Vicodin [Hydrocodone-Acetaminophen] Rash    ROS Review of Systems See pertinent positives and negatives per HPI.   Objective:    Physical Exam Vitals and nursing note reviewed.  Constitutional:      General: She is not in acute distress.    Appearance: Normal appearance.  HENT:     Head: Normocephalic and atraumatic.     Right Ear: Tympanic membrane, ear canal and external ear normal. There is impacted cerumen.     Left Ear: Tympanic membrane, ear canal and external ear normal. There is impacted cerumen.  Eyes:     Conjunctiva/sclera: Conjunctivae normal.  Cardiovascular:     Rate and Rhythm: Normal rate and regular rhythm.     Pulses: Normal pulses.     Heart sounds: Normal heart sounds.  Pulmonary:     Effort: Pulmonary effort is normal.     Breath sounds: Normal breath sounds.  Musculoskeletal:     Cervical back: Normal range of motion.  Skin:    General: Skin is warm and dry.  Neurological:     General: No focal deficit present.     Mental Status: She is alert and oriented to person, place, and time.  Psychiatric:        Mood and Affect: Mood normal.        Behavior: Behavior normal.        Thought Content: Thought content normal.        Judgment: Judgment normal.    BP 131/84 Comment: at home    Pulse 85    Temp 98 F (36.7 C) (Temporal)    Ht 5\' 8"  (1.727 m)    Wt 250 lb 9.6 oz (113.7 kg)    SpO2 99%    BMI 38.10 kg/m  Wt Readings from Last 3 Encounters:  10/24/21 250 lb 9.6 oz (113.7 kg)  10/17/21 244 lb 3.2 oz (110.8 kg)  09/21/21  248 lb 6.4 oz (112.7 kg)     Health Maintenance Due  Topic Date Due   COVID-19 Vaccine (1) Never done   Hepatitis C Screening  Never done    There are no preventive care reminders to display for this patient.  Lab Results  Component Value Date   TSH 1.71 09/22/2017   Lab Results  Component Value Date   WBC 10.1 03/15/2021   HGB 11.4 (L) 03/15/2021   HCT 36.5 03/15/2021   MCV 86.9 03/15/2021   PLT 274 03/15/2021   Lab Results  Component Value Date   NA 139 03/15/2021   K 4.0 03/15/2021   CO2 29 03/15/2021   GLUCOSE 99 03/15/2021   BUN 12 03/15/2021   CREATININE 0.91 03/15/2021   BILITOT 0.2 (L) 03/28/2020   ALKPHOS 76 03/28/2020   AST 14 (L) 03/28/2020   ALT 17 03/28/2020   PROT 7.8 03/28/2020   ALBUMIN 4.2 03/28/2020   CALCIUM 9.7 03/15/2021   ANIONGAP 8 03/15/2021   GFR 87.55 04/01/2018   Lab Results  Component Value Date   CHOL 177 04/01/2018   Lab Results  Component Value Date   HDL 44.00 04/01/2018   Lab Results  Component Value Date   LDLCALC 118 (H) 04/01/2018   Lab Results  Component Value Date   TRIG 78.0 04/01/2018   Lab Results  Component Value Date   CHOLHDL 4 04/01/2018   Lab Results  Component Value Date   HGBA1C 6.4 04/01/2018      Assessment & Plan:   Problem List Items Addressed This Visit       Cardiovascular and Mediastinum   Hypertension - Primary    Chronic, stable. BP elevated in office today, but is well controlled at home. Continue checking blood pressure daily and continue metoprolol 25mg  BID. Check CMP, CBC. Follow up in 6 months.       Relevant Orders   CBC with Differential/Platelet   Comprehensive metabolic panel   TSH     Digestive   GERD (gastroesophageal  reflux disease)    Chronic, stable. Continue prilosec daily. Follow up with any concerns.         Other   Seasonal allergies    Chronic, stable. Continue singulair, zyrtec, and flunisolide nasal spray. Follow up with any concerns.       Borderline diabetes    Check A1C today and treat based on results.       Relevant Orders   Hemoglobin A1c   Other Visit Diagnoses     Encounter for hepatitis C screening test for low risk patient       Hepatitis C screening today   Relevant Orders   Hepatitis C antibody   Encounter for lipid screening for cardiovascular disease       Lipid panel today, treat based on results   Relevant Orders   Lipid panel   Vitamin D deficiency       History of low vitamin D. Check vitamin D today and treat based on results.    Relevant Orders   VITAMIN D 25 Hydroxy (Vit-D Deficiency, Fractures)   Iron deficiency       She has a history of low iron in the past and would like her iron counts checked today. Iron panel and CBC ordered today.    Relevant Orders   Iron, TIBC and Ferritin Panel   Bilateral impacted cerumen       Ear irrgiated bilaterally with good success and tolerated well.  No instruments used/needed. Discussed not using q--tips. F/U with any concerns.        No orders of the defined types were placed in this encounter.   Follow-up: Return in about 6 months (around 04/23/2022) for CPE.    Charyl Dancer, NP

## 2021-10-24 ENCOUNTER — Encounter: Payer: Self-pay | Admitting: Nurse Practitioner

## 2021-10-24 ENCOUNTER — Other Ambulatory Visit: Payer: Self-pay

## 2021-10-24 ENCOUNTER — Ambulatory Visit (INDEPENDENT_AMBULATORY_CARE_PROVIDER_SITE_OTHER): Payer: 59 | Admitting: Nurse Practitioner

## 2021-10-24 VITALS — BP 131/84 | HR 85 | Temp 98.0°F | Ht 68.0 in | Wt 250.6 lb

## 2021-10-24 DIAGNOSIS — J302 Other seasonal allergic rhinitis: Secondary | ICD-10-CM | POA: Diagnosis not present

## 2021-10-24 DIAGNOSIS — I10 Essential (primary) hypertension: Secondary | ICD-10-CM | POA: Diagnosis not present

## 2021-10-24 DIAGNOSIS — R7303 Prediabetes: Secondary | ICD-10-CM

## 2021-10-24 DIAGNOSIS — Z1159 Encounter for screening for other viral diseases: Secondary | ICD-10-CM

## 2021-10-24 DIAGNOSIS — K219 Gastro-esophageal reflux disease without esophagitis: Secondary | ICD-10-CM | POA: Diagnosis not present

## 2021-10-24 DIAGNOSIS — Z13220 Encounter for screening for lipoid disorders: Secondary | ICD-10-CM | POA: Diagnosis not present

## 2021-10-24 DIAGNOSIS — Z136 Encounter for screening for cardiovascular disorders: Secondary | ICD-10-CM | POA: Diagnosis not present

## 2021-10-24 DIAGNOSIS — H6123 Impacted cerumen, bilateral: Secondary | ICD-10-CM

## 2021-10-24 DIAGNOSIS — E611 Iron deficiency: Secondary | ICD-10-CM

## 2021-10-24 DIAGNOSIS — E559 Vitamin D deficiency, unspecified: Secondary | ICD-10-CM

## 2021-10-24 LAB — CBC WITH DIFFERENTIAL/PLATELET
Basophils Absolute: 0 10*3/uL (ref 0.0–0.1)
Basophils Relative: 0.2 % (ref 0.0–3.0)
Eosinophils Absolute: 0 10*3/uL (ref 0.0–0.7)
Eosinophils Relative: 0 % (ref 0.0–5.0)
HCT: 37.9 % (ref 36.0–46.0)
Hemoglobin: 11.9 g/dL — ABNORMAL LOW (ref 12.0–15.0)
Lymphocytes Relative: 28.8 % (ref 12.0–46.0)
Lymphs Abs: 3.6 10*3/uL (ref 0.7–4.0)
MCHC: 31.4 g/dL (ref 30.0–36.0)
MCV: 83.5 fl (ref 78.0–100.0)
Monocytes Absolute: 0.7 10*3/uL (ref 0.1–1.0)
Monocytes Relative: 5.5 % (ref 3.0–12.0)
Neutro Abs: 8.1 10*3/uL — ABNORMAL HIGH (ref 1.4–7.7)
Neutrophils Relative %: 65.5 % (ref 43.0–77.0)
Platelets: 219 10*3/uL (ref 150.0–400.0)
RBC: 4.54 Mil/uL (ref 3.87–5.11)
RDW: 15 % (ref 11.5–15.5)
WBC: 12.4 10*3/uL — ABNORMAL HIGH (ref 4.0–10.5)

## 2021-10-24 LAB — COMPREHENSIVE METABOLIC PANEL
ALT: 15 U/L (ref 0–35)
AST: 12 U/L (ref 0–37)
Albumin: 4.3 g/dL (ref 3.5–5.2)
Alkaline Phosphatase: 60 U/L (ref 39–117)
BUN: 18 mg/dL (ref 6–23)
CO2: 26 mEq/L (ref 19–32)
Calcium: 9.5 mg/dL (ref 8.4–10.5)
Chloride: 107 mEq/L (ref 96–112)
Creatinine, Ser: 0.85 mg/dL (ref 0.40–1.20)
GFR: 74.53 mL/min (ref >60.00)
Glucose, Bld: 92 mg/dL (ref 70–99)
Potassium: 3.7 mEq/L (ref 3.5–5.1)
Sodium: 143 mEq/L (ref 135–145)
Total Bilirubin: 0.3 mg/dL (ref 0.2–1.2)
Total Protein: 7.1 g/dL (ref 6.0–8.3)

## 2021-10-24 LAB — HEMOGLOBIN A1C: Hgb A1c MFr Bld: 6.3 % (ref 4.6–6.5)

## 2021-10-24 LAB — LIPID PANEL
Cholesterol: 200 mg/dL (ref 0–200)
HDL: 54.9 mg/dL (ref >39.00)
LDL Cholesterol: 127 mg/dL — ABNORMAL HIGH (ref 0–99)
NonHDL: 144.6
Total CHOL/HDL Ratio: 4
Triglycerides: 87 mg/dL (ref 0.0–149.0)
VLDL: 17.4 mg/dL (ref 0.0–40.0)

## 2021-10-24 LAB — TSH: TSH: 0.76 u[IU]/mL (ref 0.35–5.50)

## 2021-10-24 LAB — VITAMIN D 25 HYDROXY (VIT D DEFICIENCY, FRACTURES): VITD: 15.67 ng/mL — ABNORMAL LOW (ref 30.00–100.00)

## 2021-10-24 NOTE — Assessment & Plan Note (Signed)
Chronic, stable. Continue singulair, zyrtec, and flunisolide nasal spray. Follow up with any concerns.

## 2021-10-24 NOTE — Assessment & Plan Note (Addendum)
Chronic, stable. BP elevated in office today, but is well controlled at home. Continue checking blood pressure daily and continue metoprolol 25mg  BID. Check CMP, CBC. Follow up in 6 months.

## 2021-10-24 NOTE — Patient Instructions (Signed)
It was great to see you!  Keep checking your blood pressure daily and let me know if it goes over 140>90 for several days in a row.   We are checking your labs today and will let you know the results via mychart/phone.   Let's follow-up in 6 months, sooner if you have concerns.  If a referral was placed today, you will be contacted for an appointment. Please note that routine referrals can sometimes take up to 3-4 weeks to process. Please call our office if you haven't heard anything after this time frame.  Take care,  Vance Peper, NP

## 2021-10-24 NOTE — Assessment & Plan Note (Signed)
Check A1C today and treat based on results.

## 2021-10-24 NOTE — Assessment & Plan Note (Signed)
Chronic, stable. Continue prilosec daily. Follow up with any concerns.

## 2021-10-25 LAB — IRON,TIBC AND FERRITIN PANEL
%SAT: 22 % (calc) (ref 16–45)
Ferritin: 72 ng/mL (ref 16–232)
Iron: 79 ug/dL (ref 45–160)
TIBC: 353 mcg/dL (calc) (ref 250–450)

## 2021-10-25 LAB — HEPATITIS C ANTIBODY
Hepatitis C Ab: NONREACTIVE
SIGNAL TO CUT-OFF: 0.02 (ref <1.00)

## 2021-10-26 MED ORDER — VITAMIN D (ERGOCALCIFEROL) 1.25 MG (50000 UNIT) PO CAPS: 50000.0000 [IU] | ORAL_CAPSULE | ORAL | 0 refills | Status: DC

## 2021-10-26 NOTE — Addendum Note (Signed)
Addended by: Vance Peper A on: 10/26/2021 03:57 PM   Modules accepted: Orders
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
## 2021-10-31 ENCOUNTER — Other Ambulatory Visit: Payer: Self-pay

## 2021-11-01 ENCOUNTER — Ambulatory Visit (INDEPENDENT_AMBULATORY_CARE_PROVIDER_SITE_OTHER): Payer: 59 | Admitting: Nurse Practitioner

## 2021-11-01 ENCOUNTER — Encounter: Payer: Self-pay | Admitting: Nurse Practitioner

## 2021-11-01 VITALS — BP 135/73 | HR 90 | Temp 97.0°F | Wt 250.2 lb

## 2021-11-01 DIAGNOSIS — M5442 Lumbago with sciatica, left side: Secondary | ICD-10-CM

## 2021-11-01 DIAGNOSIS — G8929 Other chronic pain: Secondary | ICD-10-CM

## 2021-11-01 DIAGNOSIS — J302 Other seasonal allergic rhinitis: Secondary | ICD-10-CM | POA: Diagnosis not present

## 2021-11-01 MED ORDER — LEVOCETIRIZINE DIHYDROCHLORIDE 5 MG PO TABS: 5.0000 mg | ORAL_TABLET | Freq: Every evening | ORAL | 1 refills | Status: DC

## 2021-11-01 MED ORDER — DULOXETINE HCL 30 MG PO CPEP: 30.0000 mg | ORAL_CAPSULE | Freq: Every day | ORAL | 1 refills | Status: DC

## 2021-11-01 NOTE — Assessment & Plan Note (Addendum)
Chronic, ongoing.  Prednisone taper did not help with pain at all.  Dr. Ellene Route does not accept her insurance and PT is not affordable at this time.  We will reach out to her referral coordinator to send her referral to neurosurgery to a different office in network.  Continue home exercises.  We will start Cymbalta 30 mg daily.  Discussed possible side effects.  Follow-up in 4 weeks.

## 2021-11-01 NOTE — Patient Instructions (Addendum)
It was great to see you!  Start vitamin D tablet once a week. Take it with food. Then start vitamin D over the counter 2,000 units daily.   Start cymbalta 1 capsule daily.   Start xyzal 1 tablet at bedtime to help with allergies. Take this with the montelukast.   Let's follow-up in 4 weeks, sooner if you have concerns.  If a referral was placed today, you will be contacted for an appointment. Please note that routine referrals can sometimes take up to 3-4 weeks to process. Please call our office if you haven't heard anything after this time frame.  Take care,  Vance Peper, NP

## 2021-11-01 NOTE — Assessment & Plan Note (Signed)
Chronic, not controlled.  Stop Zyrtec and start xyzal.  Continue montelukast.  She can also use nasal saline sinus rinses.  Follow-up if symptoms do not improve.

## 2021-11-04 LAB — FECAL OCCULT BLOOD, IMMUNOCHEMICAL: Fecal Occult Bld: POSITIVE — AB

## 2021-11-05 ENCOUNTER — Telehealth: Payer: Self-pay

## 2021-11-05 NOTE — Telephone Encounter (Signed)
Patient informed FIT test results positive for blood in stool. Patient states she has history of hemorrhoids and constipation, saw that hemorrhoids had dropped. Patient informed to inform her PCP to see if needs to be referred to GI for colonoscopy. Discussed results and answered all questions, Patient agreed to contact her PCP.

## 2021-11-09 NOTE — Telephone Encounter (Signed)
Pt called and requested call back from [facility] regarding this, please advise

## 2021-11-12 NOTE — Telephone Encounter (Signed)
Called and spoke to patient. All concerns resolved. Pt will cb to schedule sooner appt w/ pcp. Sw, cma

## 2021-11-23 ENCOUNTER — Encounter: Payer: Self-pay | Admitting: Nurse Practitioner

## 2021-11-23 ENCOUNTER — Ambulatory Visit (INDEPENDENT_AMBULATORY_CARE_PROVIDER_SITE_OTHER): Payer: 59 | Admitting: Nurse Practitioner

## 2021-11-23 ENCOUNTER — Other Ambulatory Visit: Payer: Self-pay

## 2021-11-23 VITALS — BP 138/84 | HR 88 | Temp 97.4°F | Wt 251.8 lb

## 2021-11-23 DIAGNOSIS — M5442 Lumbago with sciatica, left side: Secondary | ICD-10-CM | POA: Diagnosis not present

## 2021-11-23 DIAGNOSIS — G8929 Other chronic pain: Secondary | ICD-10-CM | POA: Diagnosis not present

## 2021-11-23 NOTE — Patient Instructions (Addendum)
It was great to see you! ? ?Make sure you start the duloxetine (cymbalta) daily. This will help with your back pain. Keep going to physical therapy. I have followed up on the referral to neurosurgery. ? ?We will just have you follow as needed for your back pain.  ? ?If a referral was placed today, you will be contacted for an appointment. Please note that routine referrals can sometimes take up to 3-4 weeks to process. Please call our office if you haven't heard anything after this time frame. ? ?Take care, ? ?Vance Peper, NP ? ?
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
## 2021-11-28 NOTE — Progress Notes (Signed)
? ?Established Patient Office Visit ? ?Subjective:  ?Patient ID: Felicia Chase, female    DOB: 1960-10-16  Age: 61 y.o. MRN: [id number] ? ?CC:  ?Chief Complaint  ?Patient presents with  ? Follow-up  ?  Follow up blood in stool.  ? ? ?HPI ?Felicia Chase presents for follow-up on positive hemoccult from home stool test. She does have a history of hemorrhoids. She did not see the blood on the stool when she did the sample. The last time she had blood in her stool was in 2019. She had a colonoscopy at this time and the blood was noted to be from her hemorrhoids. Denies changes in bowel movements and abdominal pain. She states that her hemorrhoids act up intermittently. She uses preparation H as needed.   ? ?She has also noticed a rash and redness on her face. She changed to cetaphil to cera ve 3 weeks ago. She states she feels a burning and itching sensation.  She has since stopped using CeraVe. ? ?Past Medical History:  ?Diagnosis Date  ? Allergy   ? Arthritis   ? back and ankle  ? Borderline diabetes 12/23/2017  ? GERD (gastroesophageal reflux disease)   ? H/O seasonal allergies   ? Hiatal hernia   ? Hypertension   ? ? ?Past Surgical History:  ?Procedure Laterality Date  ? ANKLE SURGERY Left   ? CHOLECYSTECTOMY    ? HYSTEROSCOPY WITH D & C N/A 03/27/2021  ? Procedure: DILATATION AND CURETTAGE /HYSTEROSCOPY;  Surgeon: Chancy Milroy, MD;  Location: [facility];  Service: Gynecology;  Laterality: N/A;  ? TONSILLECTOMY    ? TUBAL LIGATION    ? ? ?Family History  ?Problem Relation Age of Onset  ? Cancer Mother   ?     breast  ? Breast cancer Mother   ? Breast cancer Maternal Aunt   ? Diabetes Maternal Aunt   ? Hypertension Maternal Grandmother   ? Colon cancer Neg Hx   ? Rectal cancer Neg Hx   ? Esophageal cancer Neg Hx   ? Stomach cancer Neg Hx   ? ? ?Social History  ? ?Socioeconomic History  ? Marital status: Single  ?  Spouse name: Not on file  ? Number of children: 1  ? Years of education: Not on file  ?  Highest education level: Bachelor's degree (e.g., BA, AB, BS)  ?Occupational History  ? 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   ?Tobacco Use  ? Smoking status: Never  ? Smokeless tobacco: Never  ?Vaping Use  ? Vaping Use: Never used  ?Substance and Sexual Activity  ? Alcohol use: Yes  ?  Comment: occasional wine  ? Drug use: No  ? Sexual activity: Not Currently  ?  Birth control/protection: Surgical  ?Other Topics Concern  ? Not on file  ?Social History Narrative  ? Has one son   ? ?Social Determinants of Health  ? ?Financial Resource Strain: Not on file  ?Food Insecurity: No Food Insecurity  ? Worried About Charity fundraiser in the Last Year: Never true  ? Ran Out of Food in the Last Year: Never true  ?Transportation Needs: No Transportation Needs  ? Lack of Transportation (Medical): No  ? Lack of Transportation (Non-Medical): No  ?Physical Activity: Not on file  ?Stress: Not on file  ?Social Connections: Not on file  ?Intimate Partner Violence: Not on file  ? ? ?Outpatient Medications Prior to Visit  ?Medication Sig Dispense Refill  ? Bioflavonoid Products ([place])  TABS See admin instructions.    ? cyclobenzaprine (FLEXERIL) 10 MG tablet Take 1 tablet (10 mg total) by mouth as needed for muscle spasms (back / ankle pain). 60 tablet 0  ? DULoxetine (CYMBALTA) 30 MG capsule Take 1 capsule (30 mg total) by mouth daily. 30 capsule 1  ? ELDERBERRY PO Take 1 tablet by mouth daily.    ? flunisolide (NASALIDE) 25 MCG/ACT (0.025%) SOLN Place 1 spray into the nose 2 (two) times daily. 1 Bottle 3  ? ibuprofen (ADVIL) 800 MG tablet Take 1 tablet (800 mg total) by mouth every 8 (eight) hours as needed. 30 tablet 0  ? levocetirizine (XYZAL ALLERGY 24HR) 5 MG tablet Take 1 tablet (5 mg total) by mouth every evening. 30 tablet 1  ? MELATONIN PO Take 1 tablet by mouth at bedtime as needed (sleep).    ? metoprolol tartrate (LOPRESSOR) 25 MG tablet Take 25 mg by mouth 2 (two) times daily.    ? MIRALAX 17 GM/SCOOP powder Take 1 Container by mouth  daily.    ? SINGULAIR 10 MG tablet Take 10 mg by mouth daily.    ? Vitamin D, Ergocalciferol, (DRISDOL) 1.25 MG (50000 UNIT) CAPS capsule Take 1 capsule (50,000 Units total) by mouth every 7 (seven) days. 12 capsule 0  ? omeprazole (PRILOSEC) 40 MG capsule Take 1 capsule (40 mg total) by mouth daily. 90 capsule 3  ? ?No facility-administered medications prior to visit.  ? ? ?Allergies  ?Allergen Reactions  ? Cefdinir Swelling  ? Penicillins Anaphylaxis and Rash  ? Cefdinir [Cefdinir] Swelling  ? Amoxicillin Rash  ? Latex Rash  ? Nickel Hives and Rash  ? Vicodin [Hydrocodone-Acetaminophen] Rash  ? ? ?ROS ?Review of Systems ?See pertinent positives and negatives per HPI. ?  ?Objective:  ?  ?Physical Exam ?Vitals and nursing note reviewed.  ?Constitutional:   ?   General: She is not in acute distress. ?   Appearance: Normal appearance.  ?HENT:  ?   Head: Normocephalic and atraumatic.  ?Eyes:  ?   Conjunctiva/sclera: Conjunctivae normal.  ?Cardiovascular:  ?   Rate and Rhythm: Normal rate and regular rhythm.  ?   Pulses: Normal pulses.  ?   Heart sounds: Normal heart sounds.  ?Pulmonary:  ?   Effort: Pulmonary effort is normal.  ?   Breath sounds: Normal breath sounds.  ?Abdominal:  ?   Palpations: Abdomen is soft.  ?   Tenderness: There is no abdominal tenderness.  ?Musculoskeletal:  ?   Cervical back: Normal range of motion.  ?Skin: ?   General: Skin is warm and dry.  ?Neurological:  ?   General: No focal deficit present.  ?   Mental Status: She is alert and oriented to person, place, and time.  ?Psychiatric:     ?   Mood and Affect: Mood normal.     ?   Behavior: Behavior normal.     ?   Thought Content: Thought content normal.     ?   Judgment: Judgment normal.  ? ? ?BP 132/88 (BP Location: Left Arm, Cuff Size: Large)   Pulse 79   Temp 97.9 ?F (36.6 ?C) (Temporal)   Ht '5\' 8"'$  (1.727 m)   Wt 250 lb 9.6 oz (113.7 kg)   SpO2 99%   BMI 38.10 kg/m?  ?Wt Readings from Last 3 Encounters:  ?11/29/21 250 lb 9.6 oz  (113.7 kg)  ?11/23/21 251 lb 12.8 oz (114.2 kg)  ?11/01/21 250 lb 3.2  oz (113.5 kg)  ? ? ? ?Health Maintenance Due  ?Topic Date Due  ? COVID-19 Vaccine (1) Never done  ? ? ?There are no preventive care reminders to display for this patient. ? ?Lab Results  ?Component Value Date  ? TSH 0.76 10/24/2021  ? ?Lab Results  ?Component Value Date  ? WBC 12.4 (H) 10/24/2021  ? HGB 11.9 (L) 10/24/2021  ? HCT 37.9 10/24/2021  ? MCV 83.5 10/24/2021  ? PLT 219.0 10/24/2021  ? ?Lab Results  ?Component Value Date  ? NA 143 10/24/2021  ? K 3.7 10/24/2021  ? CO2 26 10/24/2021  ? GLUCOSE 92 10/24/2021  ? BUN 18 10/24/2021  ? CREATININE 0.85 10/24/2021  ? BILITOT 0.3 10/24/2021  ? ALKPHOS 60 10/24/2021  ? AST 12 10/24/2021  ? ALT 15 10/24/2021  ? PROT 7.1 10/24/2021  ? ALBUMIN 4.3 10/24/2021  ? CALCIUM 9.5 10/24/2021  ? ANIONGAP 8 03/15/2021  ? GFR 74.53 10/24/2021  ? ?Lab Results  ?Component Value Date  ? CHOL 200 10/24/2021  ? ?Lab Results  ?Component Value Date  ? HDL 54.90 10/24/2021  ? ?Lab Results  ?Component Value Date  ? LDLCALC 127 (H) 10/24/2021  ? ?Lab Results  ?Component Value Date  ? TRIG 87.0 10/24/2021  ? ?Lab Results  ?Component Value Date  ? CHOLHDL 4 10/24/2021  ? ?Lab Results  ?Component Value Date  ? HGBA1C 6.3 10/24/2021  ? ? ?  ?Assessment & Plan:  ? ?Problem List Items Addressed This Visit   ? ?  ? Digestive  ? Gastroesophageal reflux disease  ?  Chronic, stable.  Refill sent in for omeprazole 40 mg daily.  Follow-up with any concerns. ?  ?  ? Relevant Medications  ? omeprazole (PRILOSEC) 40 MG capsule  ?  ? Other  ? Hematest positive stools - Primary  ?  She had a positive Hemoccult test after going to her GYN.  She states that she has not had any changes in bowel movements, seeing any blood in her stools.  She does have a history of hemorrhoids that act up once in a while.  If they do she uses Preparation H.  She feels like this may have tested positive from her hemorrhoids.  She had a colonoscopy in 01/2018  that was completely normal and did not show any polyps.  She declines referral to GI today.  If she notices any blood in her stool, changes in bowel movements please follow-up as soon as possible. ?  ?  ? ? ?Meds ordered t

## 2021-11-29 ENCOUNTER — Other Ambulatory Visit: Payer: Self-pay

## 2021-11-29 ENCOUNTER — Ambulatory Visit (INDEPENDENT_AMBULATORY_CARE_PROVIDER_SITE_OTHER): Payer: 59 | Admitting: Nurse Practitioner

## 2021-11-29 ENCOUNTER — Encounter: Payer: Self-pay | Admitting: Nurse Practitioner

## 2021-11-29 VITALS — BP 132/88 | HR 79 | Temp 97.9°F | Ht 68.0 in | Wt 250.6 lb

## 2021-11-29 DIAGNOSIS — K219 Gastro-esophageal reflux disease without esophagitis: Secondary | ICD-10-CM | POA: Diagnosis not present

## 2021-11-29 DIAGNOSIS — R195 Other fecal abnormalities: Secondary | ICD-10-CM

## 2021-11-29 MED ORDER — OMEPRAZOLE 40 MG PO CPDR: 40.0000 mg | DELAYED_RELEASE_CAPSULE | Freq: Every day | ORAL | 3 refills | Status: DC

## 2021-11-29 NOTE — Patient Instructions (Signed)
It was great to see you! ? ?Keep an eye on your bowel patterns and any bleeding in your stools. ? ?Start hydrocortisone cream to face once a day for the allergic reaction to the CeraVe. You can also use the bactroban ointment/cream that your dermatologist prescribed.  ? ?Let's follow-up with any concerns.  ? ?Take care, ? ?Vance Peper, NP ? ?

## 2021-11-30 ENCOUNTER — Encounter: Payer: Self-pay | Admitting: Nurse Practitioner

## 2021-11-30 DIAGNOSIS — R195 Other fecal abnormalities: Secondary | ICD-10-CM | POA: Insufficient documentation

## 2021-11-30 NOTE — Assessment & Plan Note (Signed)
She had a positive Hemoccult test after going to her GYN.  She states that she has not had any changes in bowel movements, seeing any blood in her stools.  She does have a history of hemorrhoids that act up once in a while.  If they do she uses Preparation H.  She feels like this may have tested positive from her hemorrhoids.  She had a colonoscopy in 01/2018 that was completely normal and did not show any polyps.  She declines referral to GI today.  If she notices any blood in her stool, changes in bowel movements please follow-up as soon as possible. ?

## 2021-11-30 NOTE — Assessment & Plan Note (Signed)
Chronic, stable.  Refill sent in for omeprazole 40 mg daily.  Follow-up with any concerns. ?

## 2021-12-26 ENCOUNTER — Ambulatory Visit (INDEPENDENT_AMBULATORY_CARE_PROVIDER_SITE_OTHER): Payer: 59 | Admitting: Nurse Practitioner

## 2021-12-26 ENCOUNTER — Encounter: Payer: Self-pay | Admitting: Nurse Practitioner

## 2021-12-26 VITALS — BP 126/86 | HR 86 | Temp 97.1°F | Wt 252.2 lb

## 2021-12-26 DIAGNOSIS — K439 Ventral hernia without obstruction or gangrene: Secondary | ICD-10-CM

## 2021-12-26 DIAGNOSIS — K219 Gastro-esophageal reflux disease without esophagitis: Secondary | ICD-10-CM | POA: Diagnosis not present

## 2021-12-26 DIAGNOSIS — R195 Other fecal abnormalities: Secondary | ICD-10-CM

## 2021-12-26 MED ORDER — OMEPRAZOLE 40 MG PO CPDR: 40.0000 mg | DELAYED_RELEASE_CAPSULE | Freq: Every day | ORAL | 3 refills | Status: DC

## 2021-12-26 NOTE — Progress Notes (Signed)
? ?Established Patient Office Visit ? ?Subjective:  ?Patient ID: Felicia Chase, female    DOB: April 01, 1961  Age: 61 y.o. MRN: [id number] ? ?CC:  ?Chief Complaint  ?Patient presents with  ? Follow-up  ?  F/u fecal test  ? ? ?HPI ?Felicia Chase presents for follow-up on her Hemoccult positive stool with GYN.  She states that she has been thinking about it a lot recently and would like to be referred to GI.  She still has not seen any blood in her stool, denies changes in stool, abdominal pain.  She does have a chronic history of constipation that is stable. ? ?She has also noticed that she has a bump on her right side near where she had her gallbladder surgery. She has noticed it getting bigger in the last few months. She states that it is sore at times.  She is not sure what this is from. ? ?Past Medical History:  ?Diagnosis Date  ? Allergy   ? Arthritis   ? back and ankle  ? Borderline diabetes 12/23/2017  ? GERD (gastroesophageal reflux disease)   ? H/O seasonal allergies   ? Hiatal hernia   ? Hypertension   ? ? ?Past Surgical History:  ?Procedure Laterality Date  ? ANKLE SURGERY Left   ? CHOLECYSTECTOMY    ? HYSTEROSCOPY WITH D & C N/A 03/27/2021  ? Procedure: DILATATION AND CURETTAGE /HYSTEROSCOPY;  Surgeon: Chancy Milroy, MD;  Location: [facility];  Service: Gynecology;  Laterality: N/A;  ? TONSILLECTOMY    ? TUBAL LIGATION    ? ? ?Family History  ?Problem Relation Age of Onset  ? Cancer Mother   ?     breast  ? Breast cancer Mother   ? Breast cancer Maternal Aunt   ? Diabetes Maternal Aunt   ? Hypertension Maternal Grandmother   ? Colon cancer Neg Hx   ? Rectal cancer Neg Hx   ? Esophageal cancer Neg Hx   ? Stomach cancer Neg Hx   ? ? ?Social History  ? ?Socioeconomic History  ? Marital status: Single  ?  Spouse name: Not on file  ? Number of children: 1  ? Years of education: Not on file  ? Highest education level: Bachelor's degree (e.g., BA, AB, BS)  ?Occupational History  ? 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    ?Tobacco Use  ? Smoking status: Never  ? Smokeless tobacco: Never  ?Vaping Use  ? Vaping Use: Never used  ?Substance and Sexual Activity  ? Alcohol use: Yes  ?  Comment: occasional wine  ? Drug use: No  ? Sexual activity: Not Currently  ?  Birth control/protection: Surgical  ?Other Topics Concern  ? Not on file  ?Social History Narrative  ? Has one son   ? ?Social Determinants of Health  ? ?Financial Resource Strain: Not on file  ?Food Insecurity: No Food Insecurity  ? Worried About Charity fundraiser in the Last Year: Never true  ? Ran Out of Food in the Last Year: Never true  ?Transportation Needs: No Transportation Needs  ? Lack of Transportation (Medical): No  ? Lack of Transportation (Non-Medical): No  ?Physical Activity: Not on file  ?Stress: Not on file  ?Social Connections: Not on file  ?Intimate Partner Violence: Not on file  ? ? ?Outpatient Medications Prior to Visit  ?Medication Sig Dispense Refill  ? Bioflavonoid Products ([place]) TABS See admin instructions.    ? cyclobenzaprine (FLEXERIL) 10 MG tablet Take 1  tablet (10 mg total) by mouth as needed for muscle spasms (back / ankle pain). 60 tablet 0  ? DULoxetine (CYMBALTA) 30 MG capsule Take 1 capsule (30 mg total) by mouth daily. 30 capsule 1  ? ELDERBERRY PO Take 1 tablet by mouth daily.    ? flunisolide (NASALIDE) 25 MCG/ACT (0.025%) SOLN Place 1 spray into the nose 2 (two) times daily. 1 Bottle 3  ? ibuprofen (ADVIL) 800 MG tablet Take 1 tablet (800 mg total) by mouth every 8 (eight) hours as needed. 30 tablet 0  ? levocetirizine (XYZAL ALLERGY 24HR) 5 MG tablet Take 1 tablet (5 mg total) by mouth every evening. 30 tablet 1  ? MELATONIN PO Take 1 tablet by mouth at bedtime as needed (sleep).    ? metoprolol tartrate (LOPRESSOR) 25 MG tablet Take 25 mg by mouth 2 (two) times daily.    ? MIRALAX 17 GM/SCOOP powder Take 1 Container by mouth daily.    ? SINGULAIR 10 MG tablet Take 10 mg by mouth daily.    ? Vitamin D, Ergocalciferol, (DRISDOL) 1.25  MG (50000 UNIT) CAPS capsule Take 1 capsule (50,000 Units total) by mouth every 7 (seven) days. 12 capsule 0  ? omeprazole (PRILOSEC) 40 MG capsule Take 1 capsule (40 mg total) by mouth daily. 90 capsule 3  ? ?No facility-administered medications prior to visit.  ? ? ?Allergies  ?Allergen Reactions  ? Cefdinir Swelling  ? Penicillins Anaphylaxis and Rash  ? Cefdinir [Cefdinir] Swelling  ? Amoxicillin Rash  ? Latex Rash  ? Nickel Hives and Rash  ? Vicodin [Hydrocodone-Acetaminophen] Rash  ? ? ?ROS ?Review of Systems ?See pertinent positives and negatives per HPI. ?  ?Objective:  ?  ?Physical Exam ?Vitals and nursing note reviewed.  ?Constitutional:   ?   General: She is not in acute distress. ?   Appearance: Normal appearance.  ?HENT:  ?   Head: Normocephalic and atraumatic.  ?Eyes:  ?   Conjunctiva/sclera: Conjunctivae normal.  ?Cardiovascular:  ?   Rate and Rhythm: Normal rate and regular rhythm.  ?   Pulses: Normal pulses.  ?   Heart sounds: Normal heart sounds.  ?Pulmonary:  ?   Effort: Pulmonary effort is normal.  ?   Breath sounds: Normal breath sounds.  ?Abdominal:  ?   Hernia: A hernia (near prior surgery incision) is present.  ?Musculoskeletal:  ?   Cervical back: Normal range of motion.  ?Skin: ?   General: Skin is warm and dry.  ?Neurological:  ?   General: No focal deficit present.  ?   Mental Status: She is alert and oriented to person, place, and time.  ?Psychiatric:     ?   Mood and Affect: Mood normal.     ?   Behavior: Behavior normal.     ?   Thought Content: Thought content normal.     ?   Judgment: Judgment normal.  ? ? ?BP 126/86 (BP Location: Left Arm, Patient Position: Sitting, Cuff Size: Normal)   Pulse 86   Temp (!) 97.1 ?F (36.2 ?C) (Temporal)   Wt 252 lb 3.2 oz (114.4 kg)   SpO2 98%   BMI 38.35 kg/m?  ?Wt Readings from Last 3 Encounters:  ?12/26/21 252 lb 3.2 oz (114.4 kg)  ?11/29/21 250 lb 9.6 oz (113.7 kg)  ?11/23/21 251 lb 12.8 oz (114.2 kg)  ? ? ? ?Health Maintenance Due  ?Topic  Date Due  ? COVID-19 Vaccine (1) Never done  ? ? ?  There are no preventive care reminders to display for this patient. ? ?Lab Results  ?Component Value Date  ? TSH 0.76 10/24/2021  ? ?Lab Results  ?Component Value Date  ? WBC 12.4 (H) 10/24/2021  ? HGB 11.9 (L) 10/24/2021  ? HCT 37.9 10/24/2021  ? MCV 83.5 10/24/2021  ? PLT 219.0 10/24/2021  ? ?Lab Results  ?Component Value Date  ? NA 143 10/24/2021  ? K 3.7 10/24/2021  ? CO2 26 10/24/2021  ? GLUCOSE 92 10/24/2021  ? BUN 18 10/24/2021  ? CREATININE 0.85 10/24/2021  ? BILITOT 0.3 10/24/2021  ? ALKPHOS 60 10/24/2021  ? AST 12 10/24/2021  ? ALT 15 10/24/2021  ? PROT 7.1 10/24/2021  ? ALBUMIN 4.3 10/24/2021  ? CALCIUM 9.5 10/24/2021  ? ANIONGAP 8 03/15/2021  ? GFR 74.53 10/24/2021  ? ?Lab Results  ?Component Value Date  ? CHOL 200 10/24/2021  ? ?Lab Results  ?Component Value Date  ? HDL 54.90 10/24/2021  ? ?Lab Results  ?Component Value Date  ? LDLCALC 127 (H) 10/24/2021  ? ?Lab Results  ?Component Value Date  ? TRIG 87.0 10/24/2021  ? ?Lab Results  ?Component Value Date  ? CHOLHDL 4 10/24/2021  ? ?Lab Results  ?Component Value Date  ? HGBA1C 6.3 10/24/2021  ? ? ?  ?Assessment & Plan:  ? ?Problem List Items Addressed This Visit   ? ?  ? Digestive  ? Gastroesophageal reflux disease  ?  Chronic, stable.  Omeprazole refill sent to the pharmacy. ?  ?  ? Relevant Medications  ? omeprazole (PRILOSEC) 40 MG capsule  ?  ? Other  ? Hematest positive stools - Primary  ?  After further consideration and thinking about her Hemoccult positive stool, she would like a referral to GI.  Referral placed today. ?  ?  ? Relevant Orders  ? Ambulatory referral to Gastroenterology  ? Hernia of abdominal wall  ?  Slight bulging near incision where she had a prior gallbladder removal.  She states that this is gotten bigger over the past few months.  It is painful at times, however not painful today.  We will place referral to general surgery per patient request. ?  ?  ? Relevant Orders  ?  Ambulatory referral to General Surgery  ? ? ?Meds ordered this encounter  ?Medications  ? omeprazole (PRILOSEC) 40 MG capsule  ?  Sig: Take 1 capsule (40 mg total) by mouth daily.  ?  Dispense:  90 capsule  ?  Refill:  3

## 2021-12-26 NOTE — Assessment & Plan Note (Signed)
After further consideration and thinking about her Hemoccult positive stool, she would like a referral to GI.  Referral placed today. ?

## 2021-12-26 NOTE — Patient Instructions (Signed)
It was great to see you! ? ?I have placed a referrals to GI and general surgery.  ? ?Let's follow-up with any further concerns.  ? ?If a referral was placed today, you will be contacted for an appointment. Please note that routine referrals can sometimes take up to 3-4 weeks to process. Please call our office if you haven't heard anything after this time frame. ? ?Take care, ? ?Vance Peper, NP ? ?

## 2021-12-26 NOTE — Assessment & Plan Note (Signed)
Chronic, stable.  Omeprazole refill sent to the pharmacy. ?

## 2021-12-26 NOTE — Assessment & Plan Note (Signed)
Slight bulging near incision where she had a prior gallbladder removal.  She states that this is gotten bigger over the past few months.  It is painful at times, however not painful today.  We will place referral to general surgery per patient request. ?

## 2022-01-17 NOTE — Progress Notes (Signed)
? ?Acute Office Visit ? ?Subjective:  ? ?  ?Patient ID: Felicia Chase, female    DOB: [birth year], 61 y.o.   MRN: [id number] ? ?Chief Complaint  ?Patient presents with  ? Cerumen Impaction  ?  Pt is her for ear irrigation and discuss allergy symptoms   ? ? ?HPI ?Patient is in today for ears feeling clogged, allergies, post nasal drip, and sinus tenderness.  ? ?EAR CLOGGED ? ?Duration: 3 weeks ?Involved ear(s): yes "right ?Sensation of feeling clogged/plugged: yes ?Decreased/muffled hearing:yes ?Ear pain: yes ?Fever: no ?Otorrhea: no ?Hearing loss: no ?Upper respiratory infection symptoms: yes ?Using Q-Tips: yes ?Status: worse ?History of cerumenosis: yes ?Treatments attempted:  xyzal, singulair  ? ?ROS ?See pertinent positives and negatives per HPI. ? ?   ?Objective:  ?  ?BP (!) 152/91 (BP Location: Left Arm, Patient Position: Sitting, Cuff Size: Large)   Pulse 85   Temp (!) 97.5 ?F (36.4 ?C) (Temporal)   Wt 250 lb 4.8 oz (113.5 kg)   SpO2 99%   BMI 38.06 kg/m?  ? ? ?Physical Exam ?Vitals and nursing note reviewed.  ?Constitutional:   ?   General: She is not in acute distress. ?   Appearance: Normal appearance.  ?HENT:  ?   Head: Normocephalic.  ?   Right Ear: Ear canal and external ear normal. A middle ear effusion is present. There is impacted cerumen.  ?   Left Ear: Tympanic membrane, ear canal and external ear normal. There is impacted cerumen.  ?   Nose:  ?   Right Sinus: Maxillary sinus tenderness present.  ?   Left Sinus: Maxillary sinus tenderness present.  ?   Mouth/Throat:  ?   Pharynx: Posterior oropharyngeal erythema present. No oropharyngeal exudate.  ?Eyes:  ?   Conjunctiva/sclera: Conjunctivae normal.  ?Cardiovascular:  ?   Rate and Rhythm: Normal rate and regular rhythm.  ?   Pulses: Normal pulses.  ?   Heart sounds: Normal heart sounds.  ?Pulmonary:  ?   Effort: Pulmonary effort is normal.  ?   Breath sounds: Normal breath sounds.  ?Musculoskeletal:  ?   Cervical back: Normal range of  motion.  ?Skin: ?   General: Skin is warm.  ?Neurological:  ?   General: No focal deficit present.  ?   Mental Status: She is alert and oriented to person, place, and time.  ?Psychiatric:     ?   Mood and Affect: Mood normal.     ?   Behavior: Behavior normal.     ?   Thought Content: Thought content normal.     ?   Judgment: Judgment normal.  ? ? ?No results found for any visits on 01/18/22. ? ?   ?Assessment & Plan:  ? ?Problem List Items Addressed This Visit   ?None ?Visit Diagnoses   ? ? Acute non-recurrent maxillary sinusitis    -  Primary  ? Symptoms x3 weeks. Will start doxycycline '100mg'$  BID x10 days and prednisone taper for middle ear effusion. Continue OTC allergy medications.   ? Relevant Medications  ? doxycycline (VIBRA-TABS) 100 MG tablet  ? fluconazole (DIFLUCAN) 150 MG tablet  ? predniSONE (DELTASONE) 10 MG tablet  ? Bilateral impacted cerumen      ? Verbal consent obtained and bilateral ears irrigated with good success. She tolerated the procedure well.   ? ?  ? ? ?Meds ordered this encounter  ?Medications  ? doxycycline (VIBRA-TABS) 100 MG tablet  ?  Sig: Take  1 tablet (100 mg total) by mouth 2 (two) times daily.  ?  Dispense:  20 tablet  ?  Refill:  0  ? fluconazole (DIFLUCAN) 150 MG tablet  ?  Sig: Take 1 tablet at the end of your antibiotics, you can take a second dose in 3 days as needed.  ?  Dispense:  2 tablet  ?  Refill:  0  ? predniSONE (DELTASONE) 10 MG tablet  ?  Sig: Take 6 tablets today, then 5 tablets tomorrow, then decrease by 1 tablet every day until gone  ?  Dispense:  21 tablet  ?  Refill:  0  ? ? ?Return if symptoms worsen or fail to improve. ? ?Charyl Dancer, NP ? ? ?

## 2022-01-18 ENCOUNTER — Ambulatory Visit (INDEPENDENT_AMBULATORY_CARE_PROVIDER_SITE_OTHER): Payer: 59 | Admitting: Nurse Practitioner

## 2022-01-18 ENCOUNTER — Encounter: Payer: Self-pay | Admitting: Nurse Practitioner

## 2022-01-18 VITALS — BP 152/91 | HR 85 | Temp 97.5°F | Wt 250.3 lb

## 2022-01-18 DIAGNOSIS — J0100 Acute maxillary sinusitis, unspecified: Secondary | ICD-10-CM

## 2022-01-18 DIAGNOSIS — H6123 Impacted cerumen, bilateral: Secondary | ICD-10-CM

## 2022-01-18 MED ORDER — PREDNISONE 10 MG PO TABS: ORAL_TABLET | ORAL | 0 refills | Status: DC

## 2022-01-18 MED ORDER — FLUCONAZOLE 150 MG PO TABS: ORAL_TABLET | ORAL | 0 refills | Status: DC

## 2022-01-18 MED ORDER — DOXYCYCLINE HYCLATE 100 MG PO TABS: 100.0000 mg | ORAL_TABLET | Freq: Two times a day (BID) | ORAL | 0 refills | Status: DC

## 2022-01-18 NOTE — Patient Instructions (Signed)
It was great to see you! ? ?"Good Morning Felicia Chase, ?  ?Our office received a referral from Vance Peper, NP to schedule an appointment. ?If you will give our office a call at your convenience to discuss scheduling at [phone] option 1 ?  ?Thank you, ?[facility]" ? ?Start doxycyline twice a day for 10 days. You can also start prednisone taper 6 tablets today, 5 tomorrow, 4 the next, then keep decreasing by 1 every day until gone. Take with food.  ? ?Let's follow-up if your symptoms don't improve or worsen.  ? ?Take care, ? ?Vance Peper, NP ? ?

## 2022-01-29 ENCOUNTER — Other Ambulatory Visit: Payer: Self-pay | Admitting: Surgery

## 2022-01-29 ENCOUNTER — Telehealth: Payer: Self-pay | Admitting: Gastroenterology

## 2022-01-29 NOTE — Telephone Encounter (Signed)
Hey Dr. Loletha Carrow, ? ?We received a referral from PCP for Hematest positive stools. Patient has a recall for 01/24/2028. Do you advise we schedule a colonoscopy or do you need to see this patient in an OV?  ? ?Thank you.  ?

## 2022-01-30 ENCOUNTER — Encounter: Payer: Self-pay | Admitting: Gastroenterology

## 2022-01-30 NOTE — Telephone Encounter (Signed)
She can be directly booked for a colonoscopy with me in the [facility]. ? ? ?- HD ?

## 2022-01-30 NOTE — Telephone Encounter (Signed)
Patient scheduled for colonoscopy 03/06/22. ?

## 2022-02-05 ENCOUNTER — Telehealth: Payer: Self-pay | Admitting: Nurse Practitioner

## 2022-02-05 MED ORDER — CLOTRIMAZOLE-BETAMETHASONE 1-0.05 % EX CREA: 1.0000 "application " | TOPICAL_CREAM | Freq: Every day | CUTANEOUS | 0 refills | Status: DC

## 2022-02-05 NOTE — Telephone Encounter (Signed)
Caller Name: Derionna Salvador Call back phone #: [phone]  MEDICATION(S): RUGBY ANTIFUNGAL CREAM CLOTRIMAZOLE 1%   Days of Med Remaining: none, she gets a yeast infection under her breast. She says she has talked about this with Ander Purpura.   Has the patient contacted their pharmacy (YES/NO)?  Yes contact your PCP IF YES, when and what did the pharmacy advise?  IF NO, request that the patient contact the pharmacy for the refills in the future.             The pharmacy will send an electronic request (except for controlled medications).  Preferred Pharmacy: [facility], [place] - [address]  [address], [place] [address]  Phone:  [phone]  Fax:  [phone]   ~~~Please advise patient/caregiver to allow 2-3 business days to process RX refills.

## 2022-02-06 ENCOUNTER — Ambulatory Visit (AMBULATORY_SURGERY_CENTER): Payer: Self-pay | Admitting: *Deleted

## 2022-02-06 VITALS — Ht 67.5 in | Wt 247.0 lb

## 2022-02-06 DIAGNOSIS — R195 Other fecal abnormalities: Secondary | ICD-10-CM

## 2022-02-06 MED ORDER — PEG 3350-KCL-NA BICARB-NACL 420 G PO SOLR: 4000.0000 mL | Freq: Once | ORAL | 0 refills | Status: AC

## 2022-02-06 NOTE — Progress Notes (Signed)
No egg or soy allergy known to patient  No issues known to pt with past sedation with any surgeries or procedures Patient denies ever being told they had issues or difficulty with intubation  No FH of Malignant Hyperthermia Pt is not on diet pills Pt is not on  home 02  Pt is not on blood thinners  Pt has issues with constipation, instructions given on 2 day prep with Miralax and Golytely. No A fib or A flutter   PV completed in person. Pt verified name, DOB, address and insurance during PV today.  Pt given instruction packet with copy of consent form to read and sign after questions answered. Pt encouraged to call with questions or issues.   Insurance confirmed with pt at [facility] today

## 2022-02-13 ENCOUNTER — Other Ambulatory Visit: Payer: Self-pay | Admitting: Surgery

## 2022-02-13 DIAGNOSIS — K432 Incisional hernia without obstruction or gangrene: Secondary | ICD-10-CM

## 2022-02-21 ENCOUNTER — Ambulatory Visit (HOSPITAL_COMMUNITY)
Admission: RE | Admit: 2022-02-21 | Discharge: 2022-02-21 | Disposition: A | Discharge status: Home / Self Care | Payer: 59 | Source: Ambulatory Visit | Attending: Surgery | Admitting: Surgery

## 2022-02-21 DIAGNOSIS — K432 Incisional hernia without obstruction or gangrene: Secondary | ICD-10-CM | POA: Insufficient documentation

## 2022-02-21 LAB — POCT I-STAT CREATININE: Creatinine, Ser: 0.9 mg/dL (ref 0.44–1.00)

## 2022-02-21 MED ORDER — IOHEXOL 300 MG/ML  SOLN
100.0000 mL | Freq: Once | INTRAMUSCULAR | Status: AC | PRN
  Administered 2022-02-21: 100 mL via INTRAVENOUS

## 2022-02-21 MED ORDER — SODIUM CHLORIDE (PF) 0.9 % IJ SOLN
INTRAMUSCULAR | Status: AC
  Filled 2022-02-21: qty 50

## 2022-02-28 ENCOUNTER — Encounter: Payer: Self-pay | Admitting: Nurse Practitioner

## 2022-02-28 ENCOUNTER — Ambulatory Visit (INDEPENDENT_AMBULATORY_CARE_PROVIDER_SITE_OTHER): Payer: 59 | Admitting: Nurse Practitioner

## 2022-02-28 VITALS — BP 150/90 | HR 75 | Temp 97.2°F | Wt 242.2 lb

## 2022-02-28 DIAGNOSIS — J302 Other seasonal allergic rhinitis: Secondary | ICD-10-CM | POA: Diagnosis not present

## 2022-02-28 DIAGNOSIS — M5412 Radiculopathy, cervical region: Secondary | ICD-10-CM | POA: Diagnosis not present

## 2022-02-28 DIAGNOSIS — I10 Essential (primary) hypertension: Secondary | ICD-10-CM

## 2022-02-28 MED ORDER — GABAPENTIN 300 MG PO CAPS: 300.0000 mg | ORAL_CAPSULE | Freq: Two times a day (BID) | ORAL | 3 refills | Status: DC

## 2022-02-28 MED ORDER — FLUTICASONE PROPIONATE 50 MCG/ACT NA SUSP: 2.0000 | Freq: Every day | NASAL | 6 refills | Status: DC

## 2022-02-28 NOTE — Assessment & Plan Note (Addendum)
Blood pressure is elevated today at 150/90. She states she didn't take her blood pressure medication this morning. Encouraged her to start checking her blood pressure at home. Follow-up in 4 weeks.

## 2022-02-28 NOTE — Progress Notes (Signed)
Acute Office Visit  Subjective:     Patient ID: Felicia Chase, female    DOB: 01/09/1961, 61 y.o.   MRN: [id number]  Chief Complaint  Patient presents with   Follow-up    Pt c/o possible pinched nerve in left arm that causes a numbness/tingling feeling that radiates to left side of chest w/ mild SOB for several weeks    HPI Patient is in today for numbness and tingling up and down her entire left arm. She states that this worsens when she is laying on her left side. She has a history of this in the past that was intermittent. She had MRI of her cervical spine which showed a potential left C6 nerve root impingement in 2019. For the last few months, this numbness and tingling has worsened and has been more constant. She states when she goes to her dance class, the numbness goes away, however then comes back once she is done. She states last night, she had a little shortness of breath, but it went away shortly.   She also has been having ongoing allergy symptoms with nasal congestion and post nasal drip. She is taking xyzal and montelukast daily. She denies fevers, sore throat, and ear pain.   ROS See pertinent positives and negatives per HPI.     Objective:    BP (!) 150/90 (BP Location: Right Arm, Cuff Size: Large)   Pulse 75   Temp (!) 97.2 F (36.2 C) (Temporal)   Wt 242 lb 3.2 oz (109.9 kg)   SpO2 100%   BMI 37.37 kg/m  BP Readings from Last 3 Encounters:  02/28/22 (!) 150/90  01/18/22 (!) 152/91  12/26/21 126/86   Wt Readings from Last 3 Encounters:  02/28/22 242 lb 3.2 oz (109.9 kg)  02/06/22 247 lb (112 kg)  01/18/22 250 lb 4.8 oz (113.5 kg)      Physical Exam Vitals and nursing note reviewed.  Constitutional:      General: She is not in acute distress.    Appearance: Normal appearance.  HENT:     Head: Normocephalic.  Eyes:     Conjunctiva/sclera: Conjunctivae normal.  Cardiovascular:     Rate and Rhythm: Normal rate and regular rhythm.      Pulses: Normal pulses.     Heart sounds: Normal heart sounds.  Pulmonary:     Effort: Pulmonary effort is normal.     Breath sounds: Normal breath sounds.  Musculoskeletal:        General: Tenderness (slight tenderness top of left shoulder) present. No swelling. Normal range of motion.     Cervical back: Normal range of motion.     Comments: Spurling test negative bilaterally   Skin:    General: Skin is warm.  Neurological:     General: No focal deficit present.     Mental Status: She is alert and oriented to person, place, and time.  Psychiatric:        Mood and Affect: Mood normal.        Behavior: Behavior normal.        Thought Content: Thought content normal.        Judgment: Judgment normal.       Assessment & Plan:   Problem List Items Addressed This Visit       Cardiovascular and Mediastinum   Hypertension    Blood pressure is elevated today at 150/90. She states she didn't take her blood pressure medication this morning. Encouraged her to start  checking her blood pressure at home. Follow-up in 4 weeks.         Nervous and Auditory   Cervical radiculopathy - Primary    Chronic cervical radiculopathy that has become more persistent over the past few months. She states that her left arm is numb most of the day now. She had a MRI in 2019 which showed left C6 nerve root impingement. Will start her on gabapentin 1 capsule at bedtime and can increase to 1 capsule twice a day after 3 days. Stretches given and referral to PT ordered. Follow-up in 4 weeks.       Relevant Medications   gabapentin (NEURONTIN) 300 MG capsule   Other Relevant Orders   Ambulatory referral to Physical Therapy     Other   Seasonal allergies    Chronic, ongoing. She is still having nasal congestion and post nasal drip with xyzal and montelukast. Will have her start flonase nasal spray daily. Follow-up if symptoms worsen or don't improve.        Meds ordered this encounter  Medications    fluticasone (FLONASE) 50 MCG/ACT nasal spray    Sig: Place 2 sprays into both nostrils daily.    Dispense:  16 g    Refill:  6   gabapentin (NEURONTIN) 300 MG capsule    Sig: Take 1 capsule (300 mg total) by mouth 2 (two) times daily. Start 1 capsule at bedtime for 3 days, may increase to twice a day    Dispense:  90 capsule    Refill:  3    Return in about 4 weeks (around 03/28/2022) for cervical radiuclopathy.  Charyl Dancer, NP

## 2022-02-28 NOTE — Assessment & Plan Note (Addendum)
Chronic, ongoing. She is still having nasal congestion and post nasal drip with xyzal and montelukast. Will have her start flonase nasal spray daily. Follow-up if symptoms worsen or don't improve.

## 2022-02-28 NOTE — Assessment & Plan Note (Signed)
Chronic cervical radiculopathy that has become more persistent over the past few months. She states that her left arm is numb most of the day now. She had a MRI in 2019 which showed left C6 nerve root impingement. Will start her on gabapentin 1 capsule at bedtime and can increase to 1 capsule twice a day after 3 days. Stretches given and referral to PT ordered. Follow-up in 4 weeks.

## 2022-02-28 NOTE — Patient Instructions (Addendum)
It was great to see you!  Start gabapentin 1 capsule at bedtime, then increase to 1 capsule twice a day after a few days.   I have added physical therapy on for your neck and numbness.   Start flonase nasal spray daily in addition to your other allergy medications.   Let's follow-up in 4 weeks, sooner if you have concerns.  If a referral was placed today, you will be contacted for an appointment. Please note that routine referrals can sometimes take up to 3-4 weeks to process. Please call our office if you haven't heard anything after this time frame.  Take care,  Vance Peper, NP

## 2022-03-03 ENCOUNTER — Encounter: Payer: Self-pay | Admitting: Certified Registered Nurse Anesthetist

## 2022-03-04 ENCOUNTER — Encounter: Payer: Self-pay | Admitting: Gastroenterology

## 2022-03-06 ENCOUNTER — Encounter: Payer: Self-pay | Admitting: Gastroenterology

## 2022-03-06 ENCOUNTER — Ambulatory Visit (AMBULATORY_SURGERY_CENTER): Payer: 59 | Admitting: Gastroenterology

## 2022-03-06 VITALS — BP 149/83 | HR 74 | Temp 98.4°F | Resp 16 | Ht 68.0 in | Wt 247.0 lb

## 2022-03-06 DIAGNOSIS — R195 Other fecal abnormalities: Secondary | ICD-10-CM

## 2022-03-06 DIAGNOSIS — K648 Other hemorrhoids: Secondary | ICD-10-CM | POA: Diagnosis not present

## 2022-03-06 DIAGNOSIS — K5730 Diverticulosis of large intestine without perforation or abscess without bleeding: Secondary | ICD-10-CM

## 2022-03-06 DIAGNOSIS — D123 Benign neoplasm of transverse colon: Secondary | ICD-10-CM

## 2022-03-06 MED ORDER — SODIUM CHLORIDE 0.9 % IV SOLN: 500.0000 mL | Freq: Once | INTRAVENOUS | Status: DC

## 2022-03-06 NOTE — Patient Instructions (Signed)
YOU HAD AN ENDOSCOPIC PROCEDURE TODAY AT [facility]:   Refer to the procedure report that was given to you for any specific questions about what was found during the examination.  If the procedure report does not answer your questions, please call your gastroenterologist to clarify.  If you requested that your care partner not be given the details of your procedure findings, then the procedure report has been included in a sealed envelope for you to review at your convenience later.  YOU SHOULD EXPECT: Some feelings of bloating in the abdomen. Passage of more gas than usual.  Walking can help get rid of the air that was put into your GI tract during the procedure and reduce the bloating. If you had a lower endoscopy (such as a colonoscopy or flexible sigmoidoscopy) you may notice spotting of blood in your stool or on the toilet paper. If you underwent a bowel prep for your procedure, you may not have a normal bowel movement for a few days.  Please Note:  You might notice some irritation and congestion in your nose or some drainage.  This is from the oxygen used during your procedure.  There is no need for concern and it should clear up in a day or so.  SYMPTOMS TO REPORT IMMEDIATELY:  Following lower endoscopy (colonoscopy or flexible sigmoidoscopy):  Excessive amounts of blood in the stool  Significant tenderness or worsening of abdominal pains  Swelling of the abdomen that is new, acute  Fever of 100F or higher  Following upper endoscopy (EGD)  Vomiting of blood or coffee ground material  New chest pain or pain under the shoulder blades  Painful or persistently difficult swallowing  New shortness of breath  Fever of 100F or higher  Black, tarry-looking stools  For urgent or emergent issues, a gastroenterologist can be reached at any hour by calling [phone]. Do not use MyChart messaging for urgent concerns.    DIET:  We do recommend a small meal at first, but  then you may proceed to your regular diet.  Drink plenty of fluids but you should avoid alcoholic beverages for 24 hours.  ACTIVITY:  You should plan to take it easy for the rest of today and you should NOT DRIVE or use heavy machinery until tomorrow (because of the sedation medicines used during the test).    FOLLOW UP: Our staff will call the number listed on your records 24-72 hours following your procedure to check on you and address any questions or concerns that you may have regarding the information given to you following your procedure. If we do not reach you, we will leave a message.  We will attempt to reach you two times.  During this call, we will ask if you have developed any symptoms of COVID 19. If you develop any symptoms (ie: fever, flu-like symptoms, shortness of breath, cough etc.) before then, please call [phone].  If you test positive for Covid 19 in the 2 weeks post procedure, please call and report this information to [place].    If any biopsies were taken you will be contacted by phone or by letter within the next 1-3 weeks.  Please call us at [phone] if you have not heard about the biopsies in 3 weeks.    SIGNATURES/CONFIDENTIALITY: You and/or your care partner have signed paperwork which will be entered into your electronic medical record.  These signatures attest to the fact that that the information above on your After  Visit Summary has been reviewed and is understood.  Full responsibility of the confidentiality of this discharge information lies with you and/or your care-partner.

## 2022-03-06 NOTE — Op Note (Signed)
[facility] Patient Name: Felicia Chase Procedure Date: 03/06/2022 11:14 AM MRN: [id number] Endoscopist: Mallie Mussel L. Loletha Carrow , MD Age: 61 Referring MD:  Date of Birth: 06/03/1961 Gender: Female Account #: [id number] Procedure:                Colonoscopy Indications:              Heme positive stool (FOBT stool card at [facility])                           No polyps last colonoscopy May 2019 Medicines:                Monitored Anesthesia Care Procedure:                Pre-Anesthesia Assessment:                           - Prior to the procedure, a History and Physical                            was performed, and patient medications and                            allergies were reviewed. The patient's tolerance of                            previous anesthesia was also reviewed. The risks                            and benefits of the procedure and the sedation                            options and risks were discussed with the patient.                            All questions were answered, and informed consent                            was obtained. Prior Anticoagulants: The patient has                            taken no previous anticoagulant or antiplatelet                            agents. ASA Grade Assessment: II - A patient with                            mild systemic disease. After reviewing the risks                            and benefits, the patient was deemed in                            satisfactory condition to undergo the procedure.  After obtaining informed consent, the colonoscope                            was passed under direct vision. Throughout the                            procedure, the patient's blood pressure, pulse, and                            oxygen saturations were monitored continuously. The                            CF [id number] #[id number] was introduced through the anus                            and advanced to the the  cecum, identified by                            appendiceal orifice and ileocecal valve. The                            colonoscopy was somewhat difficult due to a                            redundant colon and significant looping. Successful                            completion of the procedure was aided by using                            manual pressure and straightening and shortening                            the scope to obtain bowel loop reduction. The                            patient tolerated the procedure well. The quality                            of the bowel preparation was good. The ileocecal                            valve, appendiceal orifice, and rectum were                            photographed. Scope In: 11:23:15 AM Scope Out: 11:38:39 AM Scope Withdrawal Time: 0 hours 8 minutes 46 seconds  Total Procedure Duration: 0 hours 15 minutes 24 seconds  Findings:                 The perianal and digital rectal examinations were                            normal.  Repeat examination of right colon under NBI                            performed.                           There was a small lipoma, in the ascending colon.                           A diminutive polyp was found in the hepatic                            flexure. The polyp was semi-sessile. The polyp was                            removed with a cold snare. Resection and retrieval                            were complete.                           Multiple diverticula were found in the left colon.                           Internal hemorrhoids were found. The hemorrhoids                            were small.                           The exam was otherwise without abnormality on                            direct and retroflexion views. Complications:            No immediate complications. Estimated Blood Loss:     Estimated blood loss was minimal. Impression:               - Small  lipoma in the ascending colon.                           - One diminutive polyp at the hepatic flexure,                            removed with a cold snare. Resected and retrieved.                           - Diverticulosis in the left colon.                           - Internal hemorrhoids.                           - The examination was otherwise normal on direct  and retroflexion views.                           Apparent false positive FOBT. Recommendation:           - Patient has a contact number available for                            emergencies. The signs and symptoms of potential                            delayed complications were discussed with the                            patient. Return to normal activities tomorrow.                            Written discharge instructions were provided to the                            patient.                           - Resume previous diet.                           - Continue present medications.                           - Await pathology results.                           - Repeat colonoscopy is recommended for                            surveillance. The colonoscopy date will be                            determined after pathology results from today's                            exam become available for review. (remove existing                            recall placed after 2019 exam)                           - Routine FOBT not recommended in the interim. Whitman Meinhardt L. Loletha Carrow, MD 03/06/2022 11:43:40 AM This report has been signed electronically.

## 2022-03-06 NOTE — Progress Notes (Signed)
History and Physical:  This patient presents for endoscopic testing for: Encounter Diagnosis  Name Primary?   Hematest positive stools Yes   Routine FOBT T by gynecologist.  Patient without localizing symptoms. Longstanding normocytic anemia (a decade) Colonoscopy 02/02/2018 with left-sided diverticulosis and otherwise normal exam.   Patient is otherwise without complaints or active issues today.   Past Medical History: Past Medical History:  Diagnosis Date   Allergy    Arthritis    back and ankle   Borderline diabetes 12/23/2017   GERD (gastroesophageal reflux disease)    H/O seasonal allergies    Hiatal hernia    Hypertension      Past Surgical History: Past Surgical History:  Procedure Laterality Date   ANKLE SURGERY Left    CHOLECYSTECTOMY     HYSTEROSCOPY WITH D & C N/A 03/27/2021   Procedure: DILATATION AND CURETTAGE /HYSTEROSCOPY;  Surgeon: Chancy Milroy, MD;  Location: [facility];  Service: Gynecology;  Laterality: N/A;   TONSILLECTOMY     TUBAL LIGATION      Allergies: Allergies  Allergen Reactions   Cefdinir Swelling   Penicillins Anaphylaxis and Rash   Cefdinir [Cefdinir] Swelling   Amoxicillin Rash   Latex Rash   Nickel Hives and Rash   Vicodin [Hydrocodone-Acetaminophen] Rash    Outpatient Meds: Current Outpatient Medications  Medication Sig Dispense Refill   clotrimazole-betamethasone (LOTRISONE) cream Apply 1 application. topically daily. 30 g 0   fexofenadine (ALLEGRA) 180 MG tablet Take 180 mg by mouth daily.     MELATONIN PO Take 1 tablet by mouth at bedtime as needed (sleep).     metoprolol tartrate (LOPRESSOR) 25 MG tablet Take 25 mg by mouth 2 (two) times daily.     omeprazole (PRILOSEC) 40 MG capsule Take 1 capsule (40 mg total) by mouth daily. 90 capsule 3   SINGULAIR 10 MG tablet Take 10 mg by mouth daily.     Bioflavonoid Products (BIOFLEX) TABS See admin instructions.     cyclobenzaprine (FLEXERIL) 10 MG tablet Take 1 tablet (10 mg  total) by mouth as needed for muscle spasms (back / ankle pain). 60 tablet 0   DULoxetine (CYMBALTA) 30 MG capsule Take 1 capsule (30 mg total) by mouth daily. (Patient not taking: Reported on 02/06/2022) 30 capsule 1   ELDERBERRY PO Take 1 tablet by mouth daily.     fluticasone (FLONASE) 50 MCG/ACT nasal spray Place 2 sprays into both nostrils daily. 16 g 6   gabapentin (NEURONTIN) 300 MG capsule Take 1 capsule (300 mg total) by mouth 2 (two) times daily. Start 1 capsule at bedtime for 3 days, may increase to twice a day (Patient not taking: Reported on 03/06/2022) 90 capsule 3   ibuprofen (ADVIL) 800 MG tablet Take 1 tablet (800 mg total) by mouth every 8 (eight) hours as needed. 30 tablet 0   levocetirizine (XYZAL ALLERGY 24HR) 5 MG tablet Take 1 tablet (5 mg total) by mouth every evening. (Patient not taking: Reported on 02/06/2022) 30 tablet 1   MIRALAX 17 GM/SCOOP powder Take 1 Container by mouth daily. (Patient not taking: Reported on 03/06/2022)     Current Facility-Administered Medications  Medication Dose Route Frequency Provider Last Rate Last Admin   0.9 %  sodium chloride infusion  500 mL Intravenous Once Nelida Meuse III, MD          ___________________________________________________________________ Objective   Exam:  BP (!) 149/85   Pulse 78   Temp 98.4 F (36.9 C)  Ht '5\' 8"'$  (1.727 m)   Wt 247 lb (112 kg)   SpO2 100%   BMI 37.56 kg/m   CV: RRR without murmur, S1/S2 Resp: clear to auscultation bilaterally, normal RR and effort noted GI: soft, no tenderness, with active bowel sounds.   Assessment: Encounter Diagnosis  Name Primary?   Hematest positive stools Yes     Plan: Colonoscopy  The benefits and risks of the planned procedure were described in detail with the patient or (when appropriate) their health care proxy.  Risks were outlined as including, but not limited to, bleeding, infection, perforation, adverse medication reaction leading to cardiac or  pulmonary decompensation, pancreatitis (if ERCP).  The limitation of incomplete mucosal visualization was also discussed.  No guarantees or warranties were given.    The patient is appropriate for an endoscopic procedure in the ambulatory setting.   - Wilfrid Lund, MD

## 2022-03-06 NOTE — Progress Notes (Signed)
Called to room to assist during endoscopic procedure.  Patient ID and intended procedure confirmed with present staff. Received instructions for my participation in the procedure from the performing physician.  

## 2022-03-06 NOTE — Progress Notes (Signed)
Report given to PACU, vss 

## 2022-03-06 NOTE — Progress Notes (Signed)
Pt's states no medical or surgical changes since previsit or office visit. 

## 2022-03-07 ENCOUNTER — Telehealth: Payer: Self-pay

## 2022-03-07 NOTE — Telephone Encounter (Signed)
  Follow up Call-     03/06/2022   10:51 AM  Call back number  Post procedure Call Back phone  # [phone]  Permission to leave phone message Yes     Patient questions:  Do you have a fever, pain , or abdominal swelling? No. Pain Score  0 *  Have you tolerated food without any problems? Yes.    Have you been able to return to your normal activities? Yes.    Do you have any questions about your discharge instructions: Diet   No. Medications  No. Follow up visit  No.  Do you have questions or concerns about your Care? No.  Actions: * If pain score is 4 or above: No action needed, pain <4.

## 2022-03-08 ENCOUNTER — Encounter: Payer: Self-pay | Admitting: Gastroenterology

## 2022-03-14 ENCOUNTER — Other Ambulatory Visit: Payer: Self-pay | Admitting: Nurse Practitioner

## 2022-03-25 ENCOUNTER — Ambulatory Visit (INDEPENDENT_AMBULATORY_CARE_PROVIDER_SITE_OTHER): Payer: 59 | Admitting: Nurse Practitioner

## 2022-03-25 ENCOUNTER — Encounter: Payer: Self-pay | Admitting: Nurse Practitioner

## 2022-03-25 VITALS — BP 132/88 | HR 79 | Wt 241.2 lb

## 2022-03-25 DIAGNOSIS — G8929 Other chronic pain: Secondary | ICD-10-CM | POA: Diagnosis not present

## 2022-03-25 DIAGNOSIS — M5442 Lumbago with sciatica, left side: Secondary | ICD-10-CM

## 2022-03-25 MED ORDER — PREDNISONE 10 MG PO TABS: ORAL_TABLET | ORAL | 0 refills | Status: DC

## 2022-03-25 MED ORDER — LEVOCETIRIZINE DIHYDROCHLORIDE 5 MG PO TABS: 5.0000 mg | ORAL_TABLET | Freq: Every evening | ORAL | 3 refills | Status: DC

## 2022-03-25 NOTE — Patient Instructions (Signed)
It was great to see you!  Start prednisone taper, 6 tablets today, 5 tomorrow, 4 the next day, then keep decreasing by 1 every day until gone. Make sure you are doing your stretches for both your back and neck daily.   Let's follow-up in 4 weeks, sooner if you have concerns.  If a referral was placed today, you will be contacted for an appointment. Please note that routine referrals can sometimes take up to 3-4 weeks to process. Please call our office if you haven't heard anything after this time frame.  Take care,  Vance Peper, NP

## 2022-03-25 NOTE — Progress Notes (Unsigned)
Established Patient Office Visit  Subjective   Patient ID: Felicia Chase, female    DOB: 30-Aug-1961  Age: 61 y.o. MRN: [id number]  Chief Complaint  Patient presents with   Back Pain    Pt c/o lower back pain that radiates down both legs for several days. Pain range 7-10    HPI  Felicia Chase is here to follow-up on back pain. She states that after bending over on Saturday, her back pain flared up. She states that it is radiating down her leg. She states the pain is around a 7/10. She denies fevers, urinary incontinence, saddle anesthesia. She was going to PT, however the place she was going stopped taking her insurance. She has been taking her gabapentin and using an over the counter cream. Her pain is a little better today.     ROS See pertinent positives and negatives per HPI.    Objective:     BP 132/88 (BP Location: Left Arm, Cuff Size: Large)   Pulse 79   Wt 241 lb 3.2 oz (109.4 kg)   SpO2 (!) 6%   BMI 36.67 kg/m  BP Readings from Last 3 Encounters:  03/25/22 132/88  03/06/22 (!) 149/83  02/28/22 (!) 150/90   Wt Readings from Last 3 Encounters:  03/25/22 241 lb 3.2 oz (109.4 kg)  03/06/22 247 lb (112 kg)  02/28/22 242 lb 3.2 oz (109.9 kg)    Physical Exam Vitals and nursing note reviewed.  Constitutional:      General: She is not in acute distress.    Appearance: Normal appearance.  HENT:     Head: Normocephalic.  Eyes:     Conjunctiva/sclera: Conjunctivae normal.  Cardiovascular:     Rate and Rhythm: Normal rate and regular rhythm.     Pulses: Normal pulses.     Heart sounds: Normal heart sounds.  Pulmonary:     Effort: Pulmonary effort is normal.     Breath sounds: Normal breath sounds.  Musculoskeletal:        General: Tenderness (Right lower back near SI joint) present. Normal range of motion.     Cervical back: Normal range of motion.  Skin:    General: Skin is warm.  Neurological:     General: No focal deficit present.      Mental Status: She is alert and oriented to person, place, and time.  Psychiatric:        Mood and Affect: Mood normal.        Behavior: Behavior normal.        Thought Content: Thought content normal.        Judgment: Judgment normal.      Assessment & Plan:   Problem List Items Addressed This Visit       Nervous and Auditory   Chronic bilateral low back pain with left-sided sciatica - Primary    Acute flareup of chronic back pain.  It started on Saturday, has gotten slightly better.  We will have her start a prednisone taper to help with acute pain.  She can continue taking Tylenol as needed and also using heat or ice.  Encouraged her to do the stretches daily that she was doing at physical therapy, even though she cannot go anymore due to her insurance.  Follow-up if symptoms worsen or with any concerns.      Relevant Medications   predniSONE (DELTASONE) 10 MG tablet    Return if symptoms worsen or fail to improve, for keep next  scheduled appointment.    Charyl Dancer, NP

## 2022-03-26 NOTE — Assessment & Plan Note (Signed)
Acute flareup of chronic back pain.  It started on Saturday, has gotten slightly better.  We will have her start a prednisone taper to help with acute pain.  She can continue taking Tylenol as needed and also using heat or ice.  Encouraged her to do the stretches daily that she was doing at physical therapy, even though she cannot go anymore due to her insurance.  Follow-up if symptoms worsen or with any concerns.

## 2022-03-28 ENCOUNTER — Ambulatory Visit: Payer: 59 | Admitting: Nurse Practitioner

## 2022-04-01 ENCOUNTER — Telehealth: Payer: Self-pay | Admitting: Nurse Practitioner

## 2022-04-01 MED ORDER — METOPROLOL TARTRATE 25 MG PO TABS: 25.0000 mg | ORAL_TABLET | Freq: Two times a day (BID) | ORAL | 1 refills | Status: DC

## 2022-04-01 NOTE — Telephone Encounter (Signed)
Requested medication for metoprolol tartrate has been approved and sent to patient's preferred pharmacy. Please follow-up with pharmacy to check for additional refills.

## 2022-04-01 NOTE — Telephone Encounter (Signed)
Pt said time got away with her. She is out of her med metoprolol tartrate. Can you please refill asap.

## 2022-04-24 ENCOUNTER — Ambulatory Visit (INDEPENDENT_AMBULATORY_CARE_PROVIDER_SITE_OTHER): Payer: Commercial Managed Care - HMO | Admitting: Nurse Practitioner

## 2022-04-24 ENCOUNTER — Ambulatory Visit (INDEPENDENT_AMBULATORY_CARE_PROVIDER_SITE_OTHER): Payer: Commercial Managed Care - HMO

## 2022-04-24 ENCOUNTER — Ambulatory Visit: Payer: 59 | Admitting: Nurse Practitioner

## 2022-04-24 ENCOUNTER — Encounter: Payer: Self-pay | Admitting: Nurse Practitioner

## 2022-04-24 VITALS — BP 138/88 | HR 93 | Temp 97.1°F | Wt 241.8 lb

## 2022-04-24 DIAGNOSIS — G8929 Other chronic pain: Secondary | ICD-10-CM

## 2022-04-24 DIAGNOSIS — E7800 Pure hypercholesterolemia, unspecified: Secondary | ICD-10-CM | POA: Diagnosis not present

## 2022-04-24 DIAGNOSIS — R7303 Prediabetes: Secondary | ICD-10-CM

## 2022-04-24 DIAGNOSIS — J302 Other seasonal allergic rhinitis: Secondary | ICD-10-CM

## 2022-04-24 DIAGNOSIS — Z0001 Encounter for general adult medical examination with abnormal findings: Secondary | ICD-10-CM | POA: Diagnosis not present

## 2022-04-24 DIAGNOSIS — M5412 Radiculopathy, cervical region: Secondary | ICD-10-CM

## 2022-04-24 DIAGNOSIS — M5442 Lumbago with sciatica, left side: Secondary | ICD-10-CM

## 2022-04-24 DIAGNOSIS — E559 Vitamin D deficiency, unspecified: Secondary | ICD-10-CM | POA: Diagnosis not present

## 2022-04-24 DIAGNOSIS — I10 Essential (primary) hypertension: Secondary | ICD-10-CM

## 2022-04-24 LAB — CBC
HCT: 37.3 % (ref 36.0–46.0)
Hemoglobin: 12 g/dL (ref 12.0–15.0)
MCHC: 32.1 g/dL (ref 30.0–36.0)
MCV: 84.2 fl (ref 78.0–100.0)
Platelets: 225 10*3/uL (ref 150.0–400.0)
RBC: 4.43 Mil/uL (ref 3.87–5.11)
RDW: 14.3 % (ref 11.5–15.5)
WBC: 8.1 10*3/uL (ref 4.0–10.5)

## 2022-04-24 LAB — COMPREHENSIVE METABOLIC PANEL
ALT: 19 U/L (ref 0–35)
AST: 19 U/L (ref 0–37)
Albumin: 4.3 g/dL (ref 3.5–5.2)
Alkaline Phosphatase: 76 U/L (ref 39–117)
BUN: 16 mg/dL (ref 6–23)
CO2: 23 mEq/L (ref 19–32)
Calcium: 9.4 mg/dL (ref 8.4–10.5)
Chloride: 104 mEq/L (ref 96–112)
Creatinine, Ser: 0.86 mg/dL (ref 0.40–1.20)
GFR: 73.24 mL/min (ref >60.00)
Glucose, Bld: 104 mg/dL — ABNORMAL HIGH (ref 70–99)
Potassium: 3.6 mEq/L (ref 3.5–5.1)
Sodium: 140 mEq/L (ref 135–145)
Total Bilirubin: 0.4 mg/dL (ref 0.2–1.2)
Total Protein: 7.3 g/dL (ref 6.0–8.3)

## 2022-04-24 LAB — LIPID PANEL
Cholesterol: 209 mg/dL — ABNORMAL HIGH (ref 0–200)
HDL: 42.8 mg/dL (ref >39.00)
LDL Cholesterol: 150 mg/dL — ABNORMAL HIGH (ref 0–99)
NonHDL: 166.57
Total CHOL/HDL Ratio: 5
Triglycerides: 82 mg/dL (ref 0.0–149.0)
VLDL: 16.4 mg/dL (ref 0.0–40.0)

## 2022-04-24 LAB — VITAMIN D 25 HYDROXY (VIT D DEFICIENCY, FRACTURES): VITD: 17.98 ng/mL — ABNORMAL LOW (ref 30.00–100.00)

## 2022-04-24 LAB — HEMOGLOBIN A1C: Hgb A1c MFr Bld: 6.8 % — ABNORMAL HIGH (ref 4.6–6.5)

## 2022-04-24 MED ORDER — ROSUVASTATIN CALCIUM 20 MG PO TABS: 20.0000 mg | ORAL_TABLET | Freq: Every day | ORAL | 2 refills | Status: DC

## 2022-04-24 MED ORDER — METOPROLOL TARTRATE 25 MG PO TABS: 25.0000 mg | ORAL_TABLET | Freq: Two times a day (BID) | ORAL | 1 refills | Status: DC

## 2022-04-24 MED ORDER — VITAMIN D (ERGOCALCIFEROL) 1.25 MG (50000 UNIT) PO CAPS: 50000.0000 [IU] | ORAL_CAPSULE | ORAL | 0 refills | Status: DC

## 2022-04-24 NOTE — Progress Notes (Signed)
BP 138/88 (BP Location: Right Arm, Cuff Size: Large)   Pulse 93   Temp (!) 97.1 F (36.2 C) (Temporal)   Wt 241 lb 12.8 oz (109.7 kg)   SpO2 97%   BMI 36.77 kg/m    Subjective:    Patient ID: Felicia Chase, female    DOB: May 02, 1961, 61 y.o.   MRN: [id number]  HPI: Felicia Chase is a 61 y.o. female presenting on 04/24/2022 for comprehensive medical examination. Current medical complaints include: ongoing nasal congestion  She is having ongoing nasal congestion and sinus drainage.She denies fevers. She is taking singulair and xyzal daily. She states that she had an x-ray in the past which showed sinusitis and she would like another x-ray with her ongoing symptoms. She denies ear pain, cough, and sinus pain.   Menopausal Symptoms: no  Depression Screen done today and results listed below:     04/24/2022    1:27 PM 11/01/2021    1:09 PM 09/21/2021   11:22 AM 04/23/2021    1:36 PM 02/19/2021   10:01 AM  Depression screen PHQ 2/9  Decreased Interest 0 1 0 0 0  Down, Depressed, Hopeless 0 0 0 0 0  PHQ - 2 Score 0 1 0 0 0  Altered sleeping 2 1 0 0 0  Tired, decreased energy '1 2 1 '$ 0 0  Change in appetite '1 1 1 '$ 0 0  Feeling bad or failure about yourself  0 0 0 0 0  Trouble concentrating 0 1 0 0 0  Moving slowly or fidgety/restless 0 0 0 0 0  Suicidal thoughts 0 0 0 0 0  PHQ-9 Score '4 6 2 '$ 0 0  Difficult doing work/chores  Somewhat difficult Not difficult at all      The patient does not have a history of falls. I did not complete a risk assessment for falls. A plan of care for falls was not documented.   Past Medical History:  Past Medical History:  Diagnosis Date   Allergy    Arthritis    back and ankle   Borderline diabetes 12/23/2017   GERD (gastroesophageal reflux disease)    H/O seasonal allergies    Hiatal hernia    Hypertension     Surgical History:  Past Surgical History:  Procedure Laterality Date   ANKLE SURGERY Left    CHOLECYSTECTOMY      HYSTEROSCOPY WITH D & C N/A 03/27/2021   Procedure: DILATATION AND CURETTAGE /HYSTEROSCOPY;  Surgeon: Chancy Milroy, MD;  Location: [place];  Service: Gynecology;  Laterality: N/A;   TONSILLECTOMY     TUBAL LIGATION      Medications:  Current Outpatient Medications on File Prior to Visit  Medication Sig   Bioflavonoid Products ([place]) TABS See admin instructions.   clotrimazole-betamethasone (LOTRISONE) cream Apply 1 application. topically daily.   cyclobenzaprine (FLEXERIL) 10 MG tablet Take 1 tablet (10 mg total) by mouth as needed for muscle spasms (back / ankle pain).   ELDERBERRY PO Take 1 tablet by mouth daily.   fexofenadine (ALLEGRA) 180 MG tablet Take 180 mg by mouth daily.   fluticasone (FLONASE) 50 MCG/ACT nasal spray Place 2 sprays into both nostrils daily.   gabapentin (NEURONTIN) 300 MG capsule Take 1 capsule (300 mg total) by mouth 2 (two) times daily. Start 1 capsule at bedtime for 3 days, may increase to twice a day (Patient not taking: Reported on 03/06/2022)   ibuprofen (ADVIL) 800 MG tablet Take 1 tablet (800  mg total) by mouth every 8 (eight) hours as needed.   levocetirizine (XYZAL) 5 MG tablet Take 1 tablet (5 mg total) by mouth every evening.   MELATONIN PO Take 1 tablet by mouth at bedtime as needed (sleep).   MIRALAX 17 GM/SCOOP powder Take 1 Container by mouth daily. (Patient not taking: Reported on 03/06/2022)   omeprazole (PRILOSEC) 40 MG capsule Take 1 capsule (40 mg total) by mouth daily.   SINGULAIR 10 MG tablet Take 10 mg by mouth daily.   No current facility-administered medications on file prior to visit.    Allergies:  Allergies  Allergen Reactions   Cefdinir Swelling   Penicillins Anaphylaxis and Rash   Cefdinir [Cefdinir] Swelling   Amoxicillin Rash   Latex Rash   Nickel Hives and Rash   Vicodin [Hydrocodone-Acetaminophen] Rash    Social History:  Social History   Socioeconomic History   Marital status: Single    Spouse name: Not on  file   Number of children: 1   Years of education: Not on file   Highest education level: Bachelor's degree (e.g., BA, AB, BS)  Occupational History   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   Tobacco Use   Smoking status: Never   Smokeless tobacco: Never  Vaping Use   Vaping Use: Never used  Substance and Sexual Activity   Alcohol use: Yes    Comment: occasional wine   Drug use: No   Sexual activity: Not Currently    Birth control/protection: Surgical    Comment: tubal ligation per pt.  Other Topics Concern   Not on file  Social History Narrative   Has one son    Social Determinants of Health   Financial Resource Strain: Not on file  Food Insecurity: No Food Insecurity (09/04/2021)   Hunger Vital Sign    Worried About Running Out of Food in the Last Year: Never true    Ran Out of Food in the Last Year: Never true  Transportation Needs: No Transportation Needs (09/04/2021)   PRAPARE - Hydrologist (Medical): No    Lack of Transportation (Non-Medical): No  Physical Activity: Not on file  Stress: Not on file  Social Connections: Not on file  Intimate Partner Violence: Not on file   Social History   Tobacco Use  Smoking Status Never  Smokeless Tobacco Never   Social History   Substance and Sexual Activity  Alcohol Use Yes   Comment: occasional wine    Family History:  Family History  Problem Relation Age of Onset   Cancer Mother        breast   Breast cancer Mother    Breast cancer Maternal Aunt    Diabetes Maternal Aunt    Hypertension Maternal Grandmother    Colon cancer Neg Hx    Rectal cancer Neg Hx    Esophageal cancer Neg Hx    Stomach cancer Neg Hx    Colon polyps Neg Hx     Past medical history, surgical history, medications, allergies, family history and social history reviewed with patient today and changes made to appropriate areas of the chart.   Review of Systems  Constitutional:  Positive for malaise/fatigue. Negative for fever.   HENT:  Positive for congestion. Negative for ear pain, sinus pain and sore throat.   Eyes: Negative.   Respiratory: Negative.    Cardiovascular: Negative.   Gastrointestinal: Negative.   Genitourinary: Negative.   Musculoskeletal:  Positive for back pain and neck pain.  Skin: Negative.   Neurological: Negative.   Psychiatric/Behavioral: Negative.     All other ROS negative except what is listed above and in the HPI.      Objective:    BP 138/88 (BP Location: Right Arm, Cuff Size: Large)   Pulse 93   Temp (!) 97.1 F (36.2 C) (Temporal)   Wt 241 lb 12.8 oz (109.7 kg)   SpO2 97%   BMI 36.77 kg/m   Wt Readings from Last 3 Encounters:  04/24/22 241 lb 12.8 oz (109.7 kg)  03/25/22 241 lb 3.2 oz (109.4 kg)  03/06/22 247 lb (112 kg)    Physical Exam Vitals and nursing note reviewed.  Constitutional:      General: She is not in acute distress.    Appearance: Normal appearance.  HENT:     Head: Normocephalic.  Eyes:     Conjunctiva/sclera: Conjunctivae normal.  Cardiovascular:     Rate and Rhythm: Normal rate and regular rhythm.     Pulses: Normal pulses.     Heart sounds: Normal heart sounds.  Pulmonary:     Effort: Pulmonary effort is normal.     Breath sounds: Normal breath sounds.  Abdominal:     Palpations: Abdomen is soft.     Tenderness: There is no abdominal tenderness.  Musculoskeletal:        General: Normal range of motion.     Cervical back: Normal range of motion and neck supple. No tenderness.     Right lower leg: No edema.     Left lower leg: No edema.  Lymphadenopathy:     Cervical: No cervical adenopathy.  Skin:    General: Skin is warm.  Neurological:     General: No focal deficit present.     Mental Status: She is alert and oriented to person, place, and time.  Psychiatric:        Mood and Affect: Mood normal.        Behavior: Behavior normal.        Thought Content: Thought content normal.        Judgment: Judgment normal.     Results  for orders placed or performed in visit on 04/24/22  CBC  Result Value Ref Range   WBC 8.1 4.0 - 10.5 K/uL   RBC 4.43 3.87 - 5.11 Mil/uL   Platelets 225.0 150.0 - 400.0 K/uL   Hemoglobin 12.0 12.0 - 15.0 g/dL   HCT 37.3 36.0 - 46.0 %   MCV 84.2 78.0 - 100.0 fl   MCHC 32.1 30.0 - 36.0 g/dL   RDW 14.3 11.5 - 15.5 %  Comprehensive metabolic panel  Result Value Ref Range   Sodium 140 135 - 145 mEq/L   Potassium 3.6 3.5 - 5.1 mEq/L   Chloride 104 96 - 112 mEq/L   CO2 23 19 - 32 mEq/L   Glucose, Bld 104 (H) 70 - 99 mg/dL   BUN 16 6 - 23 mg/dL   Creatinine, Ser 0.86 0.40 - 1.20 mg/dL   Total Bilirubin 0.4 0.2 - 1.2 mg/dL   Alkaline Phosphatase 76 39 - 117 U/L   AST 19 0 - 37 U/L   ALT 19 0 - 35 U/L   Total Protein 7.3 6.0 - 8.3 g/dL   Albumin 4.3 3.5 - 5.2 g/dL   GFR 73.24 >60.00 mL/min   Calcium 9.4 8.4 - 10.5 mg/dL  VITAMIN D 25 Hydroxy (Vit-D Deficiency, Fractures)  Result Value Ref Range   VITD 17.98 (L) 30.00 -  100.00 ng/mL  Hemoglobin A1c  Result Value Ref Range   Hgb A1c MFr Bld 6.8 (H) 4.6 - 6.5 %  Lipid panel  Result Value Ref Range   Cholesterol 209 (H) 0 - 200 mg/dL   Triglycerides 82.0 0.0 - 149.0 mg/dL   HDL 42.80 >39.00 mg/dL   VLDL 16.4 0.0 - 40.0 mg/dL   LDL Cholesterol 150 (H) 0 - 99 mg/dL   Total CHOL/HDL Ratio 5    NonHDL 166.57       Assessment & Plan:   Problem List Items Addressed This Visit       Cardiovascular and Mediastinum   Hypertension    Chronic, stable. Continue metoprolol '25mg'$  BID. Refill sent to the pharmacy.       Relevant Medications   metoprolol tartrate (LOPRESSOR) 25 MG tablet   rosuvastatin (CRESTOR) 20 MG tablet     Nervous and Auditory   Chronic bilateral low back pain with left-sided sciatica    Chronic, stable. Continue ibuprofen as needed, gabapentin, and daily stretches. Follow-up if symptoms worsen or any concerns.       Cervical radiculopathy    Chronic, ongoing. Encouraged her to continue her stretches and  gabapentin. Discussed referral to orthopedics, however she declined today. Follow-up if symptoms worsen or don't improve.         Other   Seasonal allergies    Chronic, not controlled. She is still having ongoing sinus congestion and drainage. She would like an x-ray of her sinuses with ongoing symptoms. Continue singulair and xyzal. Start nasal saline rinses. Discussed boiling water first and letting it cool down if she is going to use tap water. She declines referral to ENT or allergist. Follow-up in 3 months.       Relevant Orders   DG Sinus 1-2 Views (Completed)   Borderline diabetes    A1c was elevated in the past, check CMP and A1c today.       Relevant Orders   Hemoglobin A1c (Completed)   Pure hypercholesterolemia    LDL was elevated in the past, will check lipid panel today.       Relevant Medications   metoprolol tartrate (LOPRESSOR) 25 MG tablet   rosuvastatin (CRESTOR) 20 MG tablet   Other Relevant Orders   Lipid panel (Completed)   Other Visit Diagnoses     Encounter for general adult medical examination with abnormal findings    -  Primary   Relevant Orders   CBC (Completed)   Comprehensive metabolic panel (Completed)   Vitamin D deficiency       Relevant Orders   VITAMIN D 25 Hydroxy (Vit-D Deficiency, Fractures) (Completed)        Follow up plan: Return in about 3 months (around 07/25/2022) for Chronic management.   LABORATORY TESTING:  - Pap smear: up to date  IMMUNIZATIONS:   - Tdap: Tetanus vaccination status reviewed: last tetanus booster within 10 years. - Influenza: Postponed to flu season - Pneumovax: Not applicable - Prevnar: Not applicable - HPV: Not applicable - Zostavax vaccine: Refused  SCREENING: -Mammogram: Up to date  - Colonoscopy: Up to date  - Bone Density: Not applicable  -Hearing Test: Not applicable  -Spirometry: Not applicable   PATIENT COUNSELING:   Advised to take 1 mg of folate supplement per day if capable of  pregnancy.   Sexuality: Discussed sexually transmitted diseases, partner selection, use of condoms, avoidance of unintended pregnancy  and contraceptive alternatives.   Advised to avoid cigarette smoking.  I discussed with the patient that most people either abstain from alcohol or drink within safe limits (<=14/week and <=4 drinks/occasion for males, <=7/weeks and <= 3 drinks/occasion for females) and that the risk for alcohol disorders and other health effects rises proportionally with the number of drinks per week and how often a drinker exceeds daily limits.  Discussed cessation/primary prevention of drug use and availability of treatment for abuse.   Diet: Encouraged to adjust caloric intake to maintain  or achieve ideal body weight, to reduce intake of dietary saturated fat and total fat, to limit sodium intake by avoiding high sodium foods and not adding table salt, and to maintain adequate dietary potassium and calcium preferably from fresh fruits, vegetables, and low-fat dairy products.    stressed the importance of regular exercise  Injury prevention: Discussed safety belts, safety helmets, smoke detector, smoking near bedding or upholstery.   Dental health: Discussed importance of regular tooth brushing, flossing, and dental visits.    NEXT PREVENTATIVE PHYSICAL DUE IN 1 YEAR. Return in about 3 months (around 07/25/2022) for Chronic management.

## 2022-04-24 NOTE — Assessment & Plan Note (Signed)
A1c was elevated in the past, check CMP and A1c today.

## 2022-04-24 NOTE — Assessment & Plan Note (Signed)
Chronic, not controlled. She is still having ongoing sinus congestion and drainage. She would like an x-ray of her sinuses with ongoing symptoms. Continue singulair and xyzal. Start nasal saline rinses. Discussed boiling water first and letting it cool down if she is going to use tap water. She declines referral to ENT or allergist. Follow-up in 3 months.

## 2022-04-24 NOTE — Assessment & Plan Note (Signed)
Chronic, ongoing. Encouraged her to continue her stretches and gabapentin. Discussed referral to orthopedics, however she declined today. Follow-up if symptoms worsen or don't improve.

## 2022-04-24 NOTE — Assessment & Plan Note (Signed)
LDL was elevated in the past, will check lipid panel today.

## 2022-04-24 NOTE — Patient Instructions (Addendum)
It was great to see you!  Start a probiotic daily to see if this helps with your digestive issues.   You can start a daily sinus rinse. I have attached instructions. We will check an x-ray of your sinuses.   Let's follow-up in 3 months, sooner if you have concerns.  If a referral was placed today, you will be contacted for an appointment. Please note that routine referrals can sometimes take up to 3-4 weeks to process. Please call our office if you haven't heard anything after this time frame.  Take care,  Vance Peper, NP

## 2022-04-24 NOTE — Assessment & Plan Note (Signed)
Chronic, stable. Continue ibuprofen as needed, gabapentin, and daily stretches. Follow-up if symptoms worsen or any concerns.

## 2022-04-24 NOTE — Assessment & Plan Note (Signed)
Chronic, stable. Continue metoprolol '25mg'$  BID. Refill sent to the pharmacy.

## 2022-04-24 NOTE — Progress Notes (Unsigned)
Initial visit for chronic nasal congestion Pt states "its gone on for years" (Pt had full dreadlocks that started at her forehead and covered her entire scalp.)

## 2022-04-29 ENCOUNTER — Telehealth: Payer: Self-pay | Admitting: Nurse Practitioner

## 2022-04-29 NOTE — Telephone Encounter (Signed)
Caller Name: Kmya Placide Call back phone #: [phone]  Reason for Call: Pt called about her x-rays made for sinus last visit, she would like a call back to speak with her about the results.

## 2022-04-30 NOTE — Telephone Encounter (Signed)
Spoke to patient and gave results for xray. Pt voiced understanding. Sw, cma

## 2022-05-06 ENCOUNTER — Telehealth: Payer: Self-pay | Admitting: Nurse Practitioner

## 2022-05-06 NOTE — Telephone Encounter (Signed)
Caller Name: Shauni Henner Call back phone #: [phone]  Reason for Call: Pt had an appt 8/09, she is saying that meds have been called in for her and she is unsure as to why. Please call to answer some of her questions.

## 2022-05-07 NOTE — Telephone Encounter (Signed)
Called and informed pt of lab results w/ provider comments and recommendations. Explained to pt why she was prescribed rosuvastatin and Vit D. Pt voiced understanding. Sw, cma

## 2022-05-13 ENCOUNTER — Ambulatory Visit (INDEPENDENT_AMBULATORY_CARE_PROVIDER_SITE_OTHER): Payer: Commercial Managed Care - HMO | Admitting: Lactation Services

## 2022-05-13 ENCOUNTER — Other Ambulatory Visit: Payer: Self-pay

## 2022-05-13 ENCOUNTER — Other Ambulatory Visit (HOSPITAL_COMMUNITY)
Admission: RE | Admit: 2022-05-13 | Discharge: 2022-05-13 | Disposition: A | Discharge status: Home / Self Care | Payer: Commercial Managed Care - HMO | Source: Ambulatory Visit | Attending: Family Medicine | Admitting: Family Medicine

## 2022-05-13 VITALS — Ht 67.5 in | Wt 238.4 lb

## 2022-05-13 DIAGNOSIS — N898 Other specified noninflammatory disorders of vagina: Secondary | ICD-10-CM | POA: Diagnosis present

## 2022-05-13 MED ORDER — METRONIDAZOLE 0.75 % VA GEL: 1.0000 | Freq: Every day | VAGINAL | 0 refills | Status: DC

## 2022-05-13 NOTE — Progress Notes (Signed)
Agree with nurses's documentation of this patient's clinic encounter.  Tylek Boney L, MD  

## 2022-05-13 NOTE — Progress Notes (Signed)
Patient in with concerns with vaginal odor that has increased recently. She reports tan and clear discharge. She does not have itching or burning She is not sexually active.   She has ordered  Boric Acid off and on for about 2 months. She reports as soon as she stops it, the odor returns.   She is going out of town this weekend and would like to drink, Patient would prefer Metrogel over Flagyl.   Vaginal Swab instructions given and patient self collected. Reviewed we will call with any abnormal results.

## 2022-05-14 LAB — CERVICOVAGINAL ANCILLARY ONLY
Bacterial Vaginitis (gardnerella): NEGATIVE
Candida Glabrata: NEGATIVE
Candida Vaginitis: NEGATIVE
Chlamydia: NEGATIVE
Comment: NEGATIVE
Comment: NEGATIVE
Comment: NEGATIVE
Comment: NEGATIVE
Comment: NEGATIVE
Comment: NORMAL
Neisseria Gonorrhea: NEGATIVE
Trichomonas: NEGATIVE

## 2022-05-15 ENCOUNTER — Telehealth: Payer: Self-pay | Admitting: Nurse Practitioner

## 2022-05-15 NOTE — Telephone Encounter (Signed)
Pt has brought in her Health Examination Certificate to be filled out by [facility]. I have placed in her folder up front. Whenever this is completed please call her at @ [phone] and she will come by and pick it up.

## 2022-05-16 ENCOUNTER — Telehealth: Payer: Self-pay

## 2022-05-16 NOTE — Telephone Encounter (Signed)
Called and explained to pt, Hep B and MMR titers or Vaccines would need to be done before health screening form can be completed and picked up. Pt stated she will f/u with employer to check and see if they have her prior records. Pt will cb once she get further confirmation from employer.

## 2022-05-16 NOTE — Telephone Encounter (Signed)
Pt is wanting a call back to figure out if she needs an OV for this concern. Please advise pt @ [phone]

## 2022-05-16 NOTE — Telephone Encounter (Signed)
Paperwork received and placed on provider's desk for review. Please allow 3-5 bus days for completion of forms. Sw, cma

## 2022-05-17 NOTE — Telephone Encounter (Signed)
Called and ldvm. Pt can pick up form anytime before 5pm today or on Tuesday 05/21/22 during business hours. Form will be placed at [place]

## 2022-05-17 NOTE — Telephone Encounter (Signed)
Returned pt phone call, no answer. She told me she would find out from employer if Hep B, and MMR are required. Waiting on response from pt

## 2022-05-17 NOTE — Telephone Encounter (Signed)
Pt is saying all you need to do is     Check Yes in the box. They told her she would have had to have gotten these shots beforehand and they did not have time to look in the Archives for the records. She wants to pick up today to take on Tuesday since we are closed on Monday. Please call pt back @ [phone].

## 2022-05-22 ENCOUNTER — Other Ambulatory Visit: Payer: Self-pay

## 2022-05-22 DIAGNOSIS — K219 Gastro-esophageal reflux disease without esophagitis: Secondary | ICD-10-CM

## 2022-05-22 MED ORDER — OMEPRAZOLE 40 MG PO CPDR: 40.0000 mg | DELAYED_RELEASE_CAPSULE | Freq: Every day | ORAL | 3 refills | Status: DC

## 2022-05-22 MED ORDER — METOPROLOL TARTRATE 25 MG PO TABS: 25.0000 mg | ORAL_TABLET | Freq: Two times a day (BID) | ORAL | 1 refills | Status: DC

## 2022-05-28 ENCOUNTER — Other Ambulatory Visit: Payer: Self-pay

## 2022-05-28 DIAGNOSIS — K219 Gastro-esophageal reflux disease without esophagitis: Secondary | ICD-10-CM

## 2022-05-28 MED ORDER — LEVOCETIRIZINE DIHYDROCHLORIDE 5 MG PO TABS
5.0000 mg | ORAL_TABLET | Freq: Every evening | ORAL | 3 refills | Status: AC
Start: 2022-05-28 — End: ?

## 2022-05-28 NOTE — Progress Notes (Unsigned)
   Acute Office Visit  Subjective:     Patient ID: Felicia Chase, female    DOB: April 16, 1961, 61 y.o.   MRN: [id number]  No chief complaint on file.   HPI Patient is in today for sore throat, cough, and nasal congestion.   UPPER RESPIRATORY TRACT INFECTION  Worst symptom: Fever: {Blank single:19197::"yes","no"} Cough: {Blank single:19197::"yes","no"} Shortness of breath: {Blank single:19197::"yes","no"} Wheezing: {Blank single:19197::"yes","no"} Chest pain: {Blank single:19197::"yes","no","yes, with cough"} Chest tightness: {Blank single:19197::"yes","no"} Chest congestion: {Blank single:19197::"yes","no"} Nasal congestion: {Blank single:19197::"yes","no"} Runny nose: {Blank single:19197::"yes","no"} Post nasal drip: {Blank single:19197::"yes","no"} Sneezing: {Blank single:19197::"yes","no"} Sore throat: {Blank single:19197::"yes","no"} Swollen glands: {Blank single:19197::"yes","no"} Sinus pressure: {Blank single:19197::"yes","no"} Headache: {Blank single:19197::"yes","no"} Face pain: {Blank single:19197::"yes","no"} Toothache: {Blank single:19197::"yes","no"} Ear pain: {Blank single:19197::"yes","no"} {Blank single:19197::""right","left", "bilateral"} Ear pressure: {Blank single:19197::"yes","no"} {Blank single:19197::""right","left", "bilateral"} Eyes red/itching:{Blank single:19197::"yes","no"} Eye drainage/crusting: {Blank single:19197::"yes","no"}  Vomiting: {Blank single:19197::"yes","no"} Rash: {Blank single:19197::"yes","no"} Fatigue: {Blank single:19197::"yes","no"} Sick contacts: {Blank single:19197::"yes","no"} Strep contacts: {Blank single:19197::"yes","no"}  Context: {Blank multiple:19196::"better","worse","stable","fluctuating"} Recurrent sinusitis: {Blank single:19197::"yes","no"} Relief with OTC cold/cough medications: {Blank single:19197::"yes","no"}  Treatments attempted: {Blank  multiple:19196::"none","cold/sinus","mucinex","anti-histamine","pseudoephedrine","cough syrup","antibiotics"}    ROS See pertinent positives and negatives per HPI.     Objective:    There were no vitals taken for this visit. {Vitals History (Optional):23777}  Physical Exam  No results found for any visits on 05/29/22.      Assessment & Plan:   Problem List Items Addressed This Visit   None   No orders of the defined types were placed in this encounter.   No follow-ups on file.  Charyl Dancer, NP

## 2022-05-29 ENCOUNTER — Encounter: Payer: Self-pay | Admitting: Nurse Practitioner

## 2022-05-29 ENCOUNTER — Ambulatory Visit (INDEPENDENT_AMBULATORY_CARE_PROVIDER_SITE_OTHER): Payer: Commercial Managed Care - HMO | Admitting: Nurse Practitioner

## 2022-05-29 VITALS — BP 132/86 | HR 88 | Temp 98.3°F | Wt 241.8 lb

## 2022-05-29 DIAGNOSIS — L989 Disorder of the skin and subcutaneous tissue, unspecified: Secondary | ICD-10-CM

## 2022-05-29 DIAGNOSIS — J029 Acute pharyngitis, unspecified: Secondary | ICD-10-CM | POA: Diagnosis not present

## 2022-05-29 LAB — POC COVID19 BINAXNOW: SARS Coronavirus 2 Ag: NEGATIVE

## 2022-05-29 MED ORDER — BENZONATATE 100 MG PO CAPS: 100.0000 mg | ORAL_CAPSULE | Freq: Three times a day (TID) | ORAL | 0 refills | Status: DC | PRN

## 2022-05-29 NOTE — Patient Instructions (Signed)
It was great to see you!  Make sure you are drinking plenty of fluids and getting rest. You can continue the mucinex, hot tea, and start tessalon as needed for the cough.  I have placed a referral to dermatology for the bumps on your arm.   [place], [place] [address], [place]  Let's follow-up if your symptoms don't improve or worsen.   Take care,  Vance Peper, NP

## 2022-07-17 ENCOUNTER — Other Ambulatory Visit: Payer: Self-pay | Admitting: Nurse Practitioner

## 2022-07-25 DIAGNOSIS — E559 Vitamin D deficiency, unspecified: Secondary | ICD-10-CM | POA: Insufficient documentation

## 2022-07-25 NOTE — Progress Notes (Signed)
Established Patient Office Visit  Subjective   Patient ID: Felicia Chase, female    DOB: 1960-09-17  Age: 61 y.o. MRN: [id number]  Chief Complaint  Patient presents with   Follow-up    3 month follow pain with fingers on both hands x 1 month.Tingling with left arm    HPI  Felicia Chase is here to follow-up on cervical radiculopathy, hyperlipidemia, prediabetes, and hypertension.   She has not been checking her blood pressure at home.  She denies chest pain, shortness of breath.  She has been taking her blood pressure medication as prescribed.  She is still having ongoing cervical radiculopathy down her left arm.  She states that she did restart her gabapentin, she had stopped this for a while.  She states that her feels like her arm is asleep all the time.  She also notes that she is having some stiffness and pain in her fingers, especially in the mornings.  She does not like to take medications.  She is interested in getting back in with neurosurgery and physical therapy since she has health insurance again.    ROS See pertinent positives and negatives per HPI.    Objective:     BP 130/84 (BP Location: Right Arm, Cuff Size: Large)   Pulse 73   Temp (!) 97 F (36.1 C) (Temporal)   Ht 5' 7.5" (1.715 m)   Wt 243 lb 3.2 oz (110.3 kg)   SpO2 99%   BMI 37.53 kg/m  BP Readings from Last 3 Encounters:  07/26/22 130/84  05/29/22 132/86  04/24/22 138/88   Wt Readings from Last 3 Encounters:  07/26/22 243 lb 3.2 oz (110.3 kg)  05/29/22 241 lb 12.8 oz (109.7 kg)  05/13/22 238 lb 6.4 oz (108.1 kg)      Physical Exam Vitals and nursing note reviewed.  Constitutional:      General: She is not in acute distress.    Appearance: Normal appearance.  HENT:     Head: Normocephalic.  Eyes:     Conjunctiva/sclera: Conjunctivae normal.  Cardiovascular:     Rate and Rhythm: Normal rate and regular rhythm.     Pulses: Normal pulses.     Heart sounds: Normal heart  sounds.  Pulmonary:     Effort: Pulmonary effort is normal.     Breath sounds: Normal breath sounds.  Musculoskeletal:        General: Tenderness (left lateral neck) present. No swelling. Normal range of motion.     Cervical back: Normal range of motion.  Skin:    General: Skin is warm.  Neurological:     General: No focal deficit present.     Mental Status: She is alert and oriented to person, place, and time.  Psychiatric:        Mood and Affect: Mood normal.        Behavior: Behavior normal.        Thought Content: Thought content normal.        Judgment: Judgment normal.    The 10-year ASCVD risk score (Arnett DK, et al., 2019) is: 8.9%    Assessment & Plan:   Problem List Items Addressed This Visit       Cardiovascular and Mediastinum   Hypertension - Primary    Chronic, stable.  We will have her continue metoprolol 25 mg twice a day.  Check CMP, CBC today.  Follow-up in 6 months.      Relevant Orders   Comprehensive metabolic  panel     Nervous and Auditory   Cervical radiculopathy    Chronic, ongoing.  She has recently restarted her gabapentin, encouraged her to increase it to twice a day.  She would like a referral back to neurosurgery since she has health insurance again and physical therapy.  Referrals placed.  Also discussed doing stretches at home.      Relevant Orders   Ambulatory referral to Physical Therapy   Ambulatory referral to Neurosurgery     Other   Borderline diabetes    Last A1c was elevated at 6.8%.  We will check A1c today.      Relevant Orders   Hemoglobin A1c   Pure hypercholesterolemia    She is continuing to take rosuvastatin 20 mg daily.  We will check lipid panel and adjust regimen based on results.      Relevant Orders   Comprehensive metabolic panel   Lipid panel   Vitamin D deficiency    She was taking vitamin D 50,000 units weekly, will check vitamin D levels today and adjust regimen based on results.      Relevant  Orders   VITAMIN D 25 Hydroxy (Vit-D Deficiency, Fractures)   Arthralgia of both hands    She has been experiencing pain and stiffness in her fingers in the mornings for the last month.  We will add ANA onto her labs to check for rheumatoid arthritis.  She can use Voltaren gel and Tylenol as needed for pain.      Relevant Orders   ANA w/Reflex   Rheumatoid Factor    Return in about 6 months (around 01/24/2023) for HTN, cervical radiculopathy.    Charyl Dancer, NP

## 2022-07-26 ENCOUNTER — Ambulatory Visit: Payer: BC Managed Care – PPO | Admitting: Nurse Practitioner

## 2022-07-26 ENCOUNTER — Encounter: Payer: Self-pay | Admitting: Nurse Practitioner

## 2022-07-26 VITALS — BP 130/84 | HR 73 | Temp 97.0°F | Ht 67.5 in | Wt 243.2 lb

## 2022-07-26 DIAGNOSIS — E559 Vitamin D deficiency, unspecified: Secondary | ICD-10-CM

## 2022-07-26 DIAGNOSIS — M25542 Pain in joints of left hand: Secondary | ICD-10-CM

## 2022-07-26 DIAGNOSIS — E7800 Pure hypercholesterolemia, unspecified: Secondary | ICD-10-CM

## 2022-07-26 DIAGNOSIS — M5412 Radiculopathy, cervical region: Secondary | ICD-10-CM

## 2022-07-26 DIAGNOSIS — R7303 Prediabetes: Secondary | ICD-10-CM | POA: Diagnosis not present

## 2022-07-26 DIAGNOSIS — I10 Essential (primary) hypertension: Secondary | ICD-10-CM | POA: Diagnosis not present

## 2022-07-26 DIAGNOSIS — M25541 Pain in joints of right hand: Secondary | ICD-10-CM

## 2022-07-26 LAB — LIPID PANEL
Cholesterol: 122 mg/dL (ref 0–200)
HDL: 45.5 mg/dL (ref >39.00)
LDL Cholesterol: 61 mg/dL (ref 0–99)
NonHDL: 76.93
Total CHOL/HDL Ratio: 3
Triglycerides: 78 mg/dL (ref 0.0–149.0)
VLDL: 15.6 mg/dL (ref 0.0–40.0)

## 2022-07-26 LAB — COMPREHENSIVE METABOLIC PANEL
ALT: 13 U/L (ref 0–35)
AST: 14 U/L (ref 0–37)
Albumin: 4.4 g/dL (ref 3.5–5.2)
Alkaline Phosphatase: 74 U/L (ref 39–117)
BUN: 17 mg/dL (ref 6–23)
CO2: 29 mEq/L (ref 19–32)
Calcium: 9.1 mg/dL (ref 8.4–10.5)
Chloride: 107 mEq/L (ref 96–112)
Creatinine, Ser: 0.91 mg/dL (ref 0.40–1.20)
GFR: 68.31 mL/min (ref >60.00)
Glucose, Bld: 103 mg/dL — ABNORMAL HIGH (ref 70–99)
Potassium: 3.9 mEq/L (ref 3.5–5.1)
Sodium: 141 mEq/L (ref 135–145)
Total Bilirubin: 0.3 mg/dL (ref 0.2–1.2)
Total Protein: 6.7 g/dL (ref 6.0–8.3)

## 2022-07-26 LAB — HEMOGLOBIN A1C: Hgb A1c MFr Bld: 6.5 % (ref 4.6–6.5)

## 2022-07-26 LAB — VITAMIN D 25 HYDROXY (VIT D DEFICIENCY, FRACTURES): VITD: 25.51 ng/mL — ABNORMAL LOW (ref 30.00–100.00)

## 2022-07-26 NOTE — Assessment & Plan Note (Signed)
Chronic, stable.  We will have her continue metoprolol 25 mg twice a day.  Check CMP, CBC today.  Follow-up in 6 months.

## 2022-07-26 NOTE — Assessment & Plan Note (Signed)
Last A1c was elevated at 6.8%.  We will check A1c today.

## 2022-07-26 NOTE — Patient Instructions (Signed)
It was great to see you!  We are checking your labs today and will let you know the results via mychart/phone.   I have placed a referral to neurosurgery for you. Keep taking your gabapentin, you can take this twice a day. I have also placed a referral to PT.   Let's follow-up in 6 months, sooner if you have concerns.  If a referral was placed today, you will be contacted for an appointment. Please note that routine referrals can sometimes take up to 3-4 weeks to process. Please call our office if you haven't heard anything after this time frame.  Take care,  Vance Peper, NP

## 2022-07-26 NOTE — Assessment & Plan Note (Signed)
She is continuing to take rosuvastatin 20 mg daily.  We will check lipid panel and adjust regimen based on results.

## 2022-07-26 NOTE — Assessment & Plan Note (Signed)
She has been experiencing pain and stiffness in her fingers in the mornings for the last month.  We will add ANA onto her labs to check for rheumatoid arthritis.  She can use Voltaren gel and Tylenol as needed for pain.

## 2022-07-26 NOTE — Assessment & Plan Note (Signed)
Chronic, ongoing.  She has recently restarted her gabapentin, encouraged her to increase it to twice a day.  She would like a referral back to neurosurgery since she has health insurance again and physical therapy.  Referrals placed.  Also discussed doing stretches at home.

## 2022-07-26 NOTE — Assessment & Plan Note (Signed)
She was taking vitamin D 50,000 units weekly, will check vitamin D levels today and adjust regimen based on results.

## 2022-07-27 LAB — ANA W/REFLEX: Anti Nuclear Antibody (ANA): NEGATIVE

## 2022-07-27 LAB — RHEUMATOID FACTOR: Rheumatoid fact SerPl-aCnc: <14 IU/mL (ref <14)

## 2022-08-22 ENCOUNTER — Telehealth: Payer: Self-pay | Admitting: Nurse Practitioner

## 2022-08-22 ENCOUNTER — Ambulatory Visit (INDEPENDENT_AMBULATORY_CARE_PROVIDER_SITE_OTHER): Payer: BC Managed Care – PPO | Admitting: Nurse Practitioner

## 2022-08-22 ENCOUNTER — Other Ambulatory Visit: Payer: Self-pay | Admitting: Nurse Practitioner

## 2022-08-22 ENCOUNTER — Encounter: Payer: Self-pay | Admitting: Nurse Practitioner

## 2022-08-22 VITALS — BP 132/86 | HR 93 | Temp 98.2°F | Resp 18 | Ht 67.5 in | Wt 243.2 lb

## 2022-08-22 DIAGNOSIS — E119 Type 2 diabetes mellitus without complications: Secondary | ICD-10-CM | POA: Diagnosis not present

## 2022-08-22 DIAGNOSIS — E7800 Pure hypercholesterolemia, unspecified: Secondary | ICD-10-CM

## 2022-08-22 DIAGNOSIS — I10 Essential (primary) hypertension: Secondary | ICD-10-CM

## 2022-08-22 MED ORDER — ROSUVASTATIN CALCIUM 20 MG PO TABS: 20.0000 mg | ORAL_TABLET | Freq: Every day | ORAL | 1 refills | Status: DC

## 2022-08-22 NOTE — Telephone Encounter (Signed)
Larene Beach from [facility] 180/102 last night with [phone]

## 2022-08-22 NOTE — Patient Instructions (Signed)
It was great to see you!  Start a multivitamin daily 50+   Start vitamin D 2,000 units daily.   Keep taking your cholesterol medicine.   You sugars are still elevated and are technically in the diabetic range. I recommend cutting back on your sugars, like your juice. I recommend getting yearly eye exams.   Let's follow-up in 3 months, sooner if you have concerns.  If a referral was placed today, you will be contacted for an appointment. Please note that routine referrals can sometimes take up to 3-4 weeks to process. Please call our office if you haven't heard anything after this time frame.  Take care,  Vance Peper, NP

## 2022-08-22 NOTE — Telephone Encounter (Signed)
LMOM asking for Pt to call w/ BP readings for today.

## 2022-08-22 NOTE — Progress Notes (Signed)
Acute Office Visit  Subjective:     Patient ID: Felicia Chase, female    DOB: 06/22/1961, 61 y.o.   MRN: [id number]  Chief Complaint  Patient presents with   Hypertension    Discuss results from last month, elevated bp    HPI Patient is in today for elevated blood pressure reading at her physical therapy appointment yesterday.  Her blood pressure was reading 180/102.  She did not feel like her blood pressure was that high when it was taken.  She denies chest pain, shortness of breath, headaches.  She did not check her blood pressure at home because she does not feel like her machine is accurate.  ROS See pertinent positives and negatives per HPI.     Objective:    BP 132/86 (BP Location: Right Arm, Cuff Size: Large)   Pulse 93   Temp 98.2 F (36.8 C) (Oral)   Resp 18   Ht 5' 7.5" (1.715 m)   Wt 243 lb 4 oz (110.3 kg)   SpO2 93%   BMI 37.54 kg/m  BP Readings from Last 3 Encounters:  08/22/22 132/86  07/26/22 130/84  05/29/22 132/86   Wt Readings from Last 3 Encounters:  08/22/22 243 lb 4 oz (110.3 kg)  07/26/22 243 lb 3.2 oz (110.3 kg)  05/29/22 241 lb 12.8 oz (109.7 kg)      Physical Exam Vitals and nursing note reviewed.  Constitutional:      General: She is not in acute distress.    Appearance: Normal appearance.  HENT:     Head: Normocephalic.  Eyes:     Conjunctiva/sclera: Conjunctivae normal.  Cardiovascular:     Rate and Rhythm: Normal rate and regular rhythm.     Pulses: Normal pulses.     Heart sounds: Normal heart sounds.  Pulmonary:     Effort: Pulmonary effort is normal.     Breath sounds: Normal breath sounds.  Musculoskeletal:     Cervical back: Normal range of motion.  Skin:    General: Skin is warm.  Neurological:     General: No focal deficit present.     Mental Status: She is alert and oriented to person, place, and time.  Psychiatric:        Mood and Affect: Mood normal.        Behavior: Behavior normal.        Thought  Content: Thought content normal.        Judgment: Judgment normal.      Assessment & Plan:   Problem List Items Addressed This Visit       Cardiovascular and Mediastinum   Hypertension - Primary    Chronic, stable.  Her BP today was 132/86.  Continue taking her metoprolol 25 mg twice daily.      Relevant Medications   rosuvastatin (CRESTOR) 20 MG tablet     Endocrine   Diabetes mellitus without complication ([place])    Her most recent A1c was 6.5% and she had an reading before of 6.8%.  Discussed that this is technically diabetes. Recommend that she focus on her diet and limit added sugars and try to switch to whole grain carbs. Follow-up in 3 months.       Relevant Medications   rosuvastatin (CRESTOR) 20 MG tablet     Other   Pure hypercholesterolemia    Chronic, stable. Continue rosuvastatin '20mg'$  daily.       Relevant Medications   rosuvastatin (CRESTOR) 20 MG tablet  Meds ordered this encounter  Medications   rosuvastatin (CRESTOR) 20 MG tablet    Sig: Take 1 tablet (20 mg total) by mouth daily.    Dispense:  90 tablet    Refill:  1    Return in about 3 months (around 11/21/2022) for HTN, Diabetes.  Charyl Dancer, NP

## 2022-08-22 NOTE — Telephone Encounter (Signed)
FYI. Pt recently seen last month w/ normal BP.

## 2022-08-23 ENCOUNTER — Encounter: Payer: Self-pay | Admitting: Nurse Practitioner

## 2022-08-23 NOTE — Assessment & Plan Note (Signed)
Her most recent A1c was 6.5% and she had an reading before of 6.8%.  Discussed that this is technically diabetes. Recommend that she focus on her diet and limit added sugars and try to switch to whole grain carbs. Follow-up in 3 months.

## 2022-08-23 NOTE — Assessment & Plan Note (Signed)
Chronic, stable.  Her BP today was 132/86.  Continue taking her metoprolol 25 mg twice daily.

## 2022-08-23 NOTE — Assessment & Plan Note (Signed)
Chronic, stable. Continue rosuvastatin '20mg'$  daily.

## 2022-11-04 ENCOUNTER — Ambulatory Visit: Payer: BC Managed Care – PPO | Admitting: Family Medicine

## 2022-11-11 ENCOUNTER — Telehealth: Payer: Self-pay | Admitting: Nurse Practitioner

## 2022-11-12 MED ORDER — CLOTRIMAZOLE-BETAMETHASONE 1-0.05 % EX CREA: TOPICAL_CREAM | Freq: Two times a day (BID) | CUTANEOUS | 0 refills | Status: DC

## 2022-11-12 NOTE — Telephone Encounter (Signed)
LVM that Rx was approved and sent into pharmacy.

## 2022-11-12 NOTE — Telephone Encounter (Signed)
Enda [id number]   Pt called to have the clotrimazole refilled  [facility]

## 2022-12-02 ENCOUNTER — Telehealth: Payer: Self-pay

## 2022-12-02 NOTE — Telephone Encounter (Signed)
Patient left VM asking to file a complaint. States she called office in February to schedule appt with Ervin. Was told his schedule was not available for scheduling and to call back in a few weeks. Called back 1 week ago and was told someone would reach out with an appointment. Did not hear from office so she called today and was told he is booked until April.

## 2022-12-03 ENCOUNTER — Telehealth: Payer: Self-pay | Admitting: Obstetrics and Gynecology

## 2022-12-03 NOTE — Telephone Encounter (Signed)
Called patient with no answer, Calling to get her scheduled with Dr.Ervin.   Let pt a detailed message to return my call.   We received a message in our in basket

## 2022-12-04 ENCOUNTER — Telehealth: Payer: Self-pay | Admitting: Obstetrics and Gynecology

## 2022-12-04 NOTE — Telephone Encounter (Signed)
Called patient for the second time to get her scheduled with Dr.Ervin,  Left her a message to call me back

## 2022-12-09 ENCOUNTER — Other Ambulatory Visit: Payer: Self-pay | Admitting: Nurse Practitioner

## 2022-12-09 ENCOUNTER — Telehealth: Payer: Self-pay | Admitting: Nurse Practitioner

## 2022-12-09 DIAGNOSIS — M545 Low back pain, unspecified: Secondary | ICD-10-CM

## 2022-12-09 MED ORDER — CYCLOBENZAPRINE HCL 10 MG PO TABS: 10.0000 mg | ORAL_TABLET | ORAL | 0 refills | Status: DC | PRN

## 2022-12-09 NOTE — Telephone Encounter (Signed)
Patient Notified that Rx sent into pharmacy

## 2022-12-09 NOTE — Addendum Note (Signed)
Addended by: Vance Peper A on: 12/09/2022 03:48 PM   Modules accepted: Orders

## 2022-12-09 NOTE — Telephone Encounter (Signed)
Prescription Request  12/09/2022  LOV: 08/22/2022  What is the name of the medication or equipment? Cyclobenzaprine  Have you contacted your pharmacy to request a refill? Yes   Which pharmacy would you like this sent to?  [facility] - [place], [place] - [address] [place] [address] Phone: [phone] Fax: [phone]     Patient notified that their request is being sent to the clinical staff for review and that they should receive a response within 2 business days.   Please advise at Mobile [phone] (mobile)

## 2022-12-17 ENCOUNTER — Other Ambulatory Visit: Payer: Self-pay

## 2022-12-17 ENCOUNTER — Ambulatory Visit (INDEPENDENT_AMBULATORY_CARE_PROVIDER_SITE_OTHER): Payer: BC Managed Care – PPO | Admitting: Obstetrics and Gynecology

## 2022-12-17 ENCOUNTER — Encounter: Payer: Self-pay | Admitting: Obstetrics and Gynecology

## 2022-12-17 VITALS — BP 150/102 | HR 76 | Ht 67.5 in | Wt 239.0 lb

## 2022-12-17 DIAGNOSIS — Z1231 Encounter for screening mammogram for malignant neoplasm of breast: Secondary | ICD-10-CM | POA: Diagnosis not present

## 2022-12-17 DIAGNOSIS — N950 Postmenopausal bleeding: Secondary | ICD-10-CM | POA: Diagnosis not present

## 2022-12-17 NOTE — Patient Instructions (Signed)
[facility] [address] [place], [place] [address] Phone [phone] Fax [phone] 

## 2022-12-17 NOTE — Progress Notes (Signed)
Felicia Chase presents for F/U appt See prior office notes Pt did not get GYN U/S. Had an episode of spotting this past Dec (2023) Pap smear UTD Needs mammogram Denies any bowel or bladder dysfunction  PE AF VSS Lungs clear  Heart RRR Abd soft + Bs  A/P PMB        Screening mammogram  Will check GYN U/S. Mammogram ordered Pt to check with insurance to see if pap smear is covered as UTD F/U in 4 weeks to discuss U/S and collect pap smear if covered and pt desires.

## 2022-12-20 ENCOUNTER — Ambulatory Visit (HOSPITAL_BASED_OUTPATIENT_CLINIC_OR_DEPARTMENT_OTHER)
Admission: RE | Admit: 2022-12-20 | Discharge: 2022-12-20 | Disposition: A | Discharge status: Home / Self Care | Payer: BC Managed Care – PPO | Source: Ambulatory Visit | Attending: Obstetrics and Gynecology | Admitting: Obstetrics and Gynecology

## 2022-12-20 DIAGNOSIS — N950 Postmenopausal bleeding: Secondary | ICD-10-CM | POA: Insufficient documentation

## 2023-01-20 ENCOUNTER — Telehealth: Payer: Self-pay | Admitting: Nurse Practitioner

## 2023-01-20 ENCOUNTER — Ambulatory Visit: Payer: BC Managed Care – PPO | Admitting: Nurse Practitioner

## 2023-01-20 NOTE — Telephone Encounter (Signed)
Noted  

## 2023-01-20 NOTE — Telephone Encounter (Signed)
Pt called in complaining of breaking out in hives, she feels she is having an allergic reaction. She has no other symptoms. I transferred her over to nurse triage.

## 2023-01-22 ENCOUNTER — Ambulatory Visit: Payer: BC Managed Care – PPO | Admitting: Obstetrics and Gynecology

## 2023-01-22 ENCOUNTER — Other Ambulatory Visit: Payer: Self-pay

## 2023-01-22 ENCOUNTER — Encounter: Payer: Self-pay | Admitting: Obstetrics and Gynecology

## 2023-01-22 VITALS — BP 163/87 | HR 93 | Ht 67.5 in | Wt 241.3 lb

## 2023-01-22 DIAGNOSIS — N950 Postmenopausal bleeding: Secondary | ICD-10-CM

## 2023-01-22 DIAGNOSIS — D219 Benign neoplasm of connective and other soft tissue, unspecified: Secondary | ICD-10-CM

## 2023-01-25 NOTE — Progress Notes (Signed)
Felicia Chase presents for F/U of her Gyn U/S. See prior office notes Gyn U/S results reviewed with pt.  PE AF BP as recorded  Lungs clear Heart RRR Abd soft + BS Ext non tender  A/P PMB        Uterine fibroids  EMBX recommended due to Gyn U/S. Reviewed with pt. Information provided as well. Pt will schedule EMBX.

## 2023-01-31 ENCOUNTER — Ambulatory Visit
Admission: RE | Admit: 2023-01-31 | Discharge: 2023-01-31 | Disposition: A | Discharge status: Home / Self Care | Payer: BC Managed Care – PPO | Source: Ambulatory Visit | Attending: Obstetrics and Gynecology | Admitting: Obstetrics and Gynecology

## 2023-01-31 DIAGNOSIS — Z1231 Encounter for screening mammogram for malignant neoplasm of breast: Secondary | ICD-10-CM

## 2023-02-05 ENCOUNTER — Other Ambulatory Visit (HOSPITAL_COMMUNITY)
Admission: RE | Admit: 2023-02-05 | Discharge: 2023-02-05 | Disposition: A | Discharge status: Home / Self Care | Payer: BC Managed Care – PPO | Source: Ambulatory Visit | Attending: Obstetrics and Gynecology | Admitting: Obstetrics and Gynecology

## 2023-02-05 ENCOUNTER — Other Ambulatory Visit: Payer: Self-pay

## 2023-02-05 ENCOUNTER — Ambulatory Visit: Payer: BC Managed Care – PPO | Admitting: Nurse Practitioner

## 2023-02-05 ENCOUNTER — Encounter: Payer: Self-pay | Admitting: Obstetrics and Gynecology

## 2023-02-05 ENCOUNTER — Ambulatory Visit (INDEPENDENT_AMBULATORY_CARE_PROVIDER_SITE_OTHER): Payer: BC Managed Care – PPO | Admitting: Obstetrics and Gynecology

## 2023-02-05 VITALS — BP 153/86 | HR 79 | Ht 67.0 in | Wt 238.8 lb

## 2023-02-05 DIAGNOSIS — R9389 Abnormal findings on diagnostic imaging of other specified body structures: Secondary | ICD-10-CM | POA: Insufficient documentation

## 2023-02-05 DIAGNOSIS — N950 Postmenopausal bleeding: Secondary | ICD-10-CM | POA: Diagnosis present

## 2023-02-05 NOTE — Progress Notes (Signed)
Pt reports no bleeding today

## 2023-02-05 NOTE — Progress Notes (Signed)
ENDOMETRIAL BIOPSY     The indications for endometrial biopsy were reviewed.   Risks of the biopsy including cramping, bleeding, infection, uterine perforation, inadequate specimen and need for additional procedures  were discussed. The patient states she understands and agrees to undergo procedure today. Consent was signed. Time out was performed. Urine HCG was negative.  During the pelvic exam, the cervix was prepped with Betadine. A single-toothed tenaculum was placed on the anterior lip of the cervix to stabilize it. The 3 mm pipelle was introduced into the endometrial cavity without difficulty to a depth of 7cm, and a moderate amount of tissue was obtained and sent to pathology. The instruments were removed from the patient's vagina. Minimal bleeding from the cervix was noted. The patient tolerated the procedure well. Routine post-procedure instructions were given to the patient.    

## 2023-02-07 LAB — SURGICAL PATHOLOGY

## 2023-02-11 ENCOUNTER — Other Ambulatory Visit: Payer: Self-pay | Admitting: Nurse Practitioner

## 2023-02-11 DIAGNOSIS — N6002 Solitary cyst of left breast: Secondary | ICD-10-CM

## 2023-02-19 ENCOUNTER — Other Ambulatory Visit: Payer: Self-pay | Admitting: Nurse Practitioner

## 2023-02-19 ENCOUNTER — Telehealth: Payer: Self-pay | Admitting: General Practice

## 2023-02-19 NOTE — Telephone Encounter (Signed)
Patient called and left message on nurse voicemail line requesting a call back with her endometrial bx results.

## 2023-02-20 NOTE — Telephone Encounter (Signed)
Notified pt that her endo bx results are normal and that the provider recommends that she follows up in 6 months.  Pt verbalized understanding.   Felicia Chase  02/20/23

## 2023-02-20 NOTE — Telephone Encounter (Signed)
Requesting: METOPROLOL TARTRATE 25 MG TAB  Last Visit: 08/22/2022 Next Visit: 02/27/2023 Last Refill: 05/22/2022   Please Advise

## 2023-02-27 ENCOUNTER — Ambulatory Visit: Payer: BC Managed Care – PPO | Admitting: Nurse Practitioner

## 2023-02-27 VITALS — BP 136/88 | HR 83 | Temp 97.6°F | Ht 67.0 in | Wt 241.2 lb

## 2023-02-27 DIAGNOSIS — I10 Essential (primary) hypertension: Secondary | ICD-10-CM

## 2023-02-27 DIAGNOSIS — E559 Vitamin D deficiency, unspecified: Secondary | ICD-10-CM

## 2023-02-27 DIAGNOSIS — E119 Type 2 diabetes mellitus without complications: Secondary | ICD-10-CM | POA: Diagnosis not present

## 2023-02-27 DIAGNOSIS — L300 Nummular dermatitis: Secondary | ICD-10-CM | POA: Diagnosis not present

## 2023-02-27 LAB — LIPID PANEL
Cholesterol: 114 mg/dL (ref 0–200)
HDL: 43.5 mg/dL (ref >39.00)
LDL Cholesterol: 52 mg/dL (ref 0–99)
NonHDL: 70.17
Total CHOL/HDL Ratio: 3
Triglycerides: 93 mg/dL (ref 0.0–149.0)
VLDL: 18.6 mg/dL (ref 0.0–40.0)

## 2023-02-27 LAB — COMPREHENSIVE METABOLIC PANEL
ALT: 17 U/L (ref 0–35)
AST: 17 U/L (ref 0–37)
Albumin: 4.3 g/dL (ref 3.5–5.2)
Alkaline Phosphatase: 68 U/L (ref 39–117)
BUN: 11 mg/dL (ref 6–23)
CO2: 28 mEq/L (ref 19–32)
Calcium: 9.2 mg/dL (ref 8.4–10.5)
Chloride: 106 mEq/L (ref 96–112)
Creatinine, Ser: 0.82 mg/dL (ref 0.40–1.20)
GFR: 77.09 mL/min (ref >60.00)
Glucose, Bld: 102 mg/dL — ABNORMAL HIGH (ref 70–99)
Potassium: 4 mEq/L (ref 3.5–5.1)
Sodium: 140 mEq/L (ref 135–145)
Total Bilirubin: 0.4 mg/dL (ref 0.2–1.2)
Total Protein: 6.8 g/dL (ref 6.0–8.3)

## 2023-02-27 LAB — VITAMIN D 25 HYDROXY (VIT D DEFICIENCY, FRACTURES): VITD: 17.89 ng/mL — ABNORMAL LOW (ref 30.00–100.00)

## 2023-02-27 LAB — CBC
HCT: 36.1 % (ref 36.0–46.0)
Hemoglobin: 11.4 g/dL — ABNORMAL LOW (ref 12.0–15.0)
MCHC: 31.5 g/dL (ref 30.0–36.0)
MCV: 84.1 fl (ref 78.0–100.0)
Platelets: 222 10*3/uL (ref 150.0–400.0)
RBC: 4.29 Mil/uL (ref 3.87–5.11)
RDW: 14.5 % (ref 11.5–15.5)
WBC: 7.9 10*3/uL (ref 4.0–10.5)

## 2023-02-27 LAB — MICROALBUMIN / CREATININE URINE RATIO
Creatinine,U: 183.5 mg/dL
Microalb Creat Ratio: 0.8 mg/g (ref 0.0–30.0)
Microalb, Ur: 1.4 mg/dL (ref 0.0–1.9)

## 2023-02-27 LAB — HEMOGLOBIN A1C: Hgb A1c MFr Bld: 6.3 % (ref 4.6–6.5)

## 2023-02-27 NOTE — Patient Instructions (Signed)
It was great to see you!  We are checking your labs today and will let you know the results via mychart/phone.   Start cutting back on your sugars and your carbohydrates  I have placed a referral to an allergist  Let's follow-up in 6 months, sooner if you have concerns.  If a referral was placed today, you will be contacted for an appointment. Please note that routine referrals can sometimes take up to 3-4 weeks to process. Please call our office if you haven't heard anything after this time frame.  Take care,  Rodman Pickle, NP

## 2023-02-27 NOTE — Progress Notes (Signed)
Established Patient Office Visit  Subjective   Patient ID: Felicia Chase, female    DOB: Sep 21, 1960  Age: 62 y.o. MRN: [id number]  Chief Complaint  Patient presents with   Skin Problem    Breaking out on arms    HPI  Felicia Chase is here to follow-up on hypertension and recent elevated A1c consistent with diabetes.  She also has been having an issue with a rash on her arms.  She states that she started having a few spots breakout on her arm that start with a red area, it turns red in the surrounding area as well and when it goes away it leaves a scar.  She states that it will itch when it is there.  It last for a few weeks.  She went [facility] care [facility] and was diagnosed with MRSA and given an antibiotic.  She went to dermatology and had a biopsy done when the lesion was active and was told it was Nummular eczema.  She is concerned with how to prevent the area from coming up since it leaves a scar.  She is going to look for a different dermatology to go see.  She does not have any spots at this time.  She states that she is doing well overall.  She denies chest pain, shortness of breath, numbness and tingling in her feet.  She is taking her medications regularly.  She is not having any side effects.    ROS See pertinent positives and negatives per HPI.    Objective:     BP 136/88 (BP Location: Left Arm)   Pulse 83   Temp 97.6 F (36.4 C)   Ht 5\' 7"  (1.702 m)   Wt 241 lb 3.2 oz (109.4 kg)   SpO2 99%   BMI 37.78 kg/m    Physical Exam Vitals and nursing note reviewed.  Constitutional:      General: She is not in acute distress.    Appearance: Normal appearance.  HENT:     Head: Normocephalic.  Eyes:     Conjunctiva/sclera: Conjunctivae normal.  Cardiovascular:     Rate and Rhythm: Normal rate and regular rhythm.     Pulses: Normal pulses.     Heart sounds: Normal heart sounds.  Pulmonary:     Effort: Pulmonary effort is normal.     Breath sounds:  Normal breath sounds.  Musculoskeletal:     Cervical back: Normal range of motion.  Skin:    General: Skin is warm.  Neurological:     General: No focal deficit present.     Mental Status: She is alert and oriented to person, place, and time.  Psychiatric:        Mood and Affect: Mood normal.        Behavior: Behavior normal.        Thought Content: Thought content normal.        Judgment: Judgment normal.    Diabetic Foot Exam - Simple   Simple Foot Form Visual Inspection No deformities, no ulcerations, no other skin breakdown bilaterally: Yes Sensation Testing Intact to touch and monofilament testing bilaterally: Yes Pulse Check Posterior Tibialis and Dorsalis pulse intact bilaterally: Yes Comments      Assessment & Plan:   Problem List Items Addressed This Visit       Cardiovascular and Mediastinum   Hypertension - Primary    Chronic, stable.  Continue metoprolol 25 mg twice daily.  Check CMP, CBC today.  Follow-up in 6 months.      Relevant Orders   CBC (Completed)   Comprehensive metabolic panel (Completed)   Lipid panel (Completed)     Endocrine   Controlled type 2 diabetes mellitus without complication, without long-term current use of insulin ([facility])    Chronic, stable.  Most recent A1c was 6.5%.  Discussed the new diagnosis of diabetes.  Discussed nutrition and limiting sugars and carbs.  Discussed getting a yearly eye exam, she states she has an eye appointment in a few weeks with her eye doctor Dr. Kathaleen Bury in [place].  She wants to focus on maintaining her sugars with diet and even reversing this.  She does not like needles and states she does not want to check her sugars at home right now.  Continue rosuvastatin 20 mg daily for cardiovascular protection.  Check CMP, CBC, A1c, and urine microalbumin today.  Follow-up in 6 months.      Relevant Orders   CBC (Completed)   Comprehensive metabolic panel (Completed)   Hemoglobin A1c (Completed)    Microalbumin / creatinine urine ratio (Completed)     Musculoskeletal and Integument   Nummular eczema    She has started to experience eczema on her arms intermittently.  She had a biopsy done by dermatologist which showed nummular eczema.  She would like to find a different dermatologist and is going to research this to determine options to help prevent this from reoccurring.  Information printed and given to her on eczema.  She is interested in having allergy testing done as well.  Referral placed to allergist.      Relevant Orders   Ambulatory referral to Allergy     Other   Vitamin D deficiency    Check vitamin D levels and treat based on results.  She is not currently taking any supplements.      Relevant Orders   VITAMIN D 25 Hydroxy (Vit-D Deficiency, Fractures) (Completed)    Return in about 6 months (around 08/29/2023) for HTN, Diabetes.    Gerre Scull, NP

## 2023-02-28 ENCOUNTER — Encounter: Payer: Self-pay | Admitting: Nurse Practitioner

## 2023-02-28 DIAGNOSIS — E119 Type 2 diabetes mellitus without complications: Secondary | ICD-10-CM | POA: Insufficient documentation

## 2023-02-28 DIAGNOSIS — L300 Nummular dermatitis: Secondary | ICD-10-CM | POA: Insufficient documentation

## 2023-02-28 MED ORDER — VITAMIN D (ERGOCALCIFEROL) 1.25 MG (50000 UNIT) PO CAPS: 50000.0000 [IU] | ORAL_CAPSULE | ORAL | 1 refills | Status: DC

## 2023-02-28 NOTE — Assessment & Plan Note (Signed)
Check vitamin D levels and treat based on results.  She is not currently taking any supplements.

## 2023-02-28 NOTE — Assessment & Plan Note (Addendum)
She has started to experience eczema on her arms intermittently.  She had a biopsy done by dermatologist which showed nummular eczema.  She would like to find a different dermatologist and is going to research this to determine options to help prevent this from reoccurring.  Information printed and given to her on eczema.  She is interested in having allergy testing done as well.  Referral placed to allergist.

## 2023-02-28 NOTE — Addendum Note (Signed)
Addended by: Rodman Pickle A on: 02/28/2023 12:43 PM   Modules accepted: Orders

## 2023-02-28 NOTE — Assessment & Plan Note (Signed)
Chronic, stable.  Most recent A1c was 6.5%.  Discussed the new diagnosis of diabetes.  Discussed nutrition and limiting sugars and carbs.  Discussed getting a yearly eye exam, she states she has an eye appointment in a few weeks with her eye doctor Dr. Kathaleen Bury in [place].  She wants to focus on maintaining her sugars with diet and even reversing this.  She does not like needles and states she does not want to check her sugars at home right now.  Continue rosuvastatin 20 mg daily for cardiovascular protection.  Check CMP, CBC, A1c, and urine microalbumin today.  Follow-up in 6 months.

## 2023-02-28 NOTE — Assessment & Plan Note (Signed)
Chronic, stable.  Continue metoprolol 25 mg twice daily.  Check CMP, CBC today.  Follow-up in 6 months.

## 2023-03-03 ENCOUNTER — Ambulatory Visit: Payer: BC Managed Care – PPO | Admitting: Nurse Practitioner

## 2023-03-03 ENCOUNTER — Telehealth: Payer: Self-pay | Admitting: Nurse Practitioner

## 2023-03-03 NOTE — Telephone Encounter (Signed)
Pt said mychart/email not working to please call with lab results at [phone]

## 2023-03-03 NOTE — Telephone Encounter (Signed)
Lab Results given. Pt expresses understanding. No further questions or concerns.

## 2023-03-05 ENCOUNTER — Ambulatory Visit: Payer: BC Managed Care – PPO

## 2023-03-05 ENCOUNTER — Ambulatory Visit
Admission: RE | Admit: 2023-03-05 | Discharge: 2023-03-05 | Disposition: A | Discharge status: Home / Self Care | Payer: BC Managed Care – PPO | Source: Ambulatory Visit | Attending: Nurse Practitioner | Admitting: Nurse Practitioner

## 2023-03-05 DIAGNOSIS — N6002 Solitary cyst of left breast: Secondary | ICD-10-CM

## 2023-03-12 ENCOUNTER — Other Ambulatory Visit: Payer: Self-pay | Admitting: Nurse Practitioner

## 2023-03-12 DIAGNOSIS — K219 Gastro-esophageal reflux disease without esophagitis: Secondary | ICD-10-CM

## 2023-03-12 NOTE — Telephone Encounter (Signed)
Pt needs her rosuvastatin (CRESTOR) 20 MG tablet [[id number]] refilled she has 0 left.

## 2023-03-13 MED ORDER — OMEPRAZOLE 40 MG PO CPDR
40.0000 mg | DELAYED_RELEASE_CAPSULE | Freq: Every day | ORAL | 1 refills | Status: DC
Start: 2023-03-13 — End: 2023-07-28

## 2023-03-13 MED ORDER — ROSUVASTATIN CALCIUM 20 MG PO TABS: 20.0000 mg | ORAL_TABLET | Freq: Every day | ORAL | 1 refills | Status: DC

## 2023-03-13 MED ORDER — METOPROLOL TARTRATE 25 MG PO TABS: 25.0000 mg | ORAL_TABLET | Freq: Two times a day (BID) | ORAL | 3 refills | Status: DC

## 2023-03-13 NOTE — Telephone Encounter (Signed)
Requesting: rosuvastatin (CRESTOR) 20 MG tablet  Last Visit: 02/27/2023 Next Visit: 09/05/2023 Last Refill: 08/22/2022  Please Advise

## 2023-03-13 NOTE — Telephone Encounter (Signed)
It is [facility].

## 2023-03-13 NOTE — Telephone Encounter (Signed)
I spoke with patient and she is going out of the going out of country and wanted to make sure she had refills on metoprolol, omeprazole, and rosuvastatin. She would like refills sent to [facility]. Patient notified that Rx will be sent in.  Requesting: Metoprolol, Omeprazole, Rosuvastatin Last Visit: 02/27/2023 Next Visit: 09/05/2023 Last Refill: 02/20/2023, 05/22/2022, 08/22/2022  Please Advise

## 2023-03-13 NOTE — Telephone Encounter (Signed)
LVM for patient to return call. I need to know the correct pharmacy to send medication to

## 2023-03-21 ENCOUNTER — Telehealth: Payer: Self-pay

## 2023-03-21 NOTE — Telephone Encounter (Signed)
VM left requesting call from nurse to discuss vaginal issues.

## 2023-03-24 NOTE — Telephone Encounter (Signed)
I called Felicia Chase and she explained she and Dr. Alysia Penna have had several issues about her vaginal odor issues and he had prescribed boric acid which she took and then she said the boric acid caused itching which she discussed with Dr Alysia Penna. States then they were talking about her biopsy and never finished talking about what else for her to try. She states she is going on a cruise the end of the month and would like to have the medicine before then. I reviewed Dr.Ervin's notes and could not see what else he may prescribe. I explained I will send him a message and once we hear from him, we will get back to her. She  voices understanding. Nancy Fetter

## 2023-04-01 ENCOUNTER — Encounter: Payer: Self-pay | Admitting: Obstetrics and Gynecology

## 2023-04-01 MED ORDER — METRONIDAZOLE 500 MG PO TABS: 500.0000 mg | ORAL_TABLET | Freq: Two times a day (BID) | ORAL | 0 refills | Status: DC

## 2023-04-01 MED ORDER — DOXYCYCLINE HYCLATE 50 MG PO CAPS: 50.0000 mg | ORAL_CAPSULE | Freq: Two times a day (BID) | ORAL | 0 refills | Status: DC

## 2023-04-01 NOTE — Telephone Encounter (Signed)
Called patient to inform her that Flagyl and Doxycycline to take for 7 days. Reviewed to complete entire courses. Reviewed not to drink alcohol while taking Flagyl. Patient voiced understanding.

## 2023-04-01 NOTE — Addendum Note (Signed)
Addended by: Ed Blalock on: 04/01/2023 04:26 PM   Modules accepted: Orders

## 2023-04-07 ENCOUNTER — Telehealth: Payer: Self-pay | Admitting: Family Medicine

## 2023-04-07 DIAGNOSIS — N898 Other specified noninflammatory disorders of vagina: Secondary | ICD-10-CM

## 2023-04-07 MED ORDER — FLUCONAZOLE 150 MG PO TABS
150.0000 mg | ORAL_TABLET | Freq: Once | ORAL | 0 refills | Status: AC
Start: 2023-04-07 — End: 2023-04-07

## 2023-04-07 NOTE — Telephone Encounter (Signed)
Pt. Called in requesting a medication to treat yeast infections, since whenever she is on antibiotics she catches a yeast infection right after.

## 2023-04-07 NOTE — Telephone Encounter (Signed)
Pt reports vaginal itching. Diflucan tablet sent per protocol. Pt will contact office if itching does not resolve in the next 3 days.

## 2023-04-30 ENCOUNTER — Ambulatory Visit (INDEPENDENT_AMBULATORY_CARE_PROVIDER_SITE_OTHER): Payer: Self-pay | Admitting: Internal Medicine

## 2023-04-30 ENCOUNTER — Encounter: Payer: Self-pay | Admitting: Internal Medicine

## 2023-04-30 VITALS — BP 134/86 | HR 83 | Temp 97.7°F | Resp 17 | Ht 67.5 in | Wt 238.3 lb

## 2023-04-30 DIAGNOSIS — J3089 Other allergic rhinitis: Secondary | ICD-10-CM

## 2023-04-30 DIAGNOSIS — J302 Other seasonal allergic rhinitis: Secondary | ICD-10-CM | POA: Diagnosis not present

## 2023-04-30 DIAGNOSIS — L300 Nummular dermatitis: Secondary | ICD-10-CM | POA: Diagnosis not present

## 2023-04-30 DIAGNOSIS — L503 Dermatographic urticaria: Secondary | ICD-10-CM

## 2023-04-30 DIAGNOSIS — H1045 Other chronic allergic conjunctivitis: Secondary | ICD-10-CM

## 2023-04-30 MED ORDER — CROMOLYN SODIUM 4 % OP SOLN: 1.0000 [drp] | Freq: Four times a day (QID) | OPHTHALMIC | 12 refills | Status: AC

## 2023-04-30 MED ORDER — MONTELUKAST SODIUM 10 MG PO TABS
10.0000 mg | ORAL_TABLET | Freq: Every day | ORAL | 5 refills | Status: DC
Start: 2023-04-30 — End: 2024-02-05

## 2023-04-30 MED ORDER — CARBINOXAMINE MALEATE 4 MG PO TABS: 4.0000 mg | ORAL_TABLET | Freq: Three times a day (TID) | ORAL | 5 refills | Status: DC

## 2023-04-30 MED ORDER — IPRATROPIUM BROMIDE 0.03 % NA SOLN: 2.0000 | Freq: Two times a day (BID) | NASAL | 12 refills | Status: AC

## 2023-04-30 MED ORDER — AZELASTINE HCL 0.1 % NA SOLN: 2.0000 | Freq: Two times a day (BID) | NASAL | 12 refills | Status: AC

## 2023-04-30 MED ORDER — EPINEPHRINE 0.3 MG/0.3ML IJ SOAJ: 0.3000 mg | INTRAMUSCULAR | 1 refills | Status: AC | PRN

## 2023-04-30 MED ORDER — TACROLIMUS 0.1 % EX OINT: TOPICAL_OINTMENT | Freq: Two times a day (BID) | CUTANEOUS | 0 refills | Status: AC

## 2023-04-30 MED ORDER — TRIAMCINOLONE ACETONIDE 0.1 % EX OINT: 1.0000 | TOPICAL_OINTMENT | Freq: Two times a day (BID) | CUTANEOUS | 0 refills | Status: AC

## 2023-04-30 NOTE — Progress Notes (Signed)
New Patient Note  RE: Felicia Chase MRN: [id number] DOB: 16-Nov-1960 Date of Office Visit: 04/30/2023  Consult requested by: Gerre Scull, NP Primary care provider: Gerre Scull, NP  Chief Complaint: Allergic Rhinitis  (seasonal), Rash (Pt states she always itching and breaking out into random rashes that looks like little sores. ), Allergy Testing, and Allergic Reaction (To tomato and possible nuts but she eats them.)  History of Present Illness: I had the pleasure of seeing Felicia Chase for initial evaluation at the [facility] on 04/30/2023. She is a 62 y.o. female, who is referred here by Gerre Scull, NP for the evaluation of dermatitis and rhinitis.  History obtained from patient, chart review.  Dermatitis:  Onset 6-9 months ago  Skin Bx: May 2024 and nummular eczema. Treated with topical steroids with some relief.   No active lesions since April, does have hyperpigmented patches that are residual. Diagnosed with PIH from dermatology.   Does have chronic daily pruritus and dermatographism however.    Chronic rhinitis: started over 20 year ago  Symptoms include: nasal congestion, rhinorrhea, post nasal drainage, sneezing, watery eyes, itchy eyes, and itchy nose, ear fullnes, sinus pressure, throat itching  Occurs year-round Potential triggers: pollen  Treatments tried: claritin and xyzal alternating, previously on nose sprays (mild benefit)  Previous allergy testing: yes: 8-10 years ago, sIgE 2019: positive to DM History of reflux/heartburn: yes severe, omeprazole daily with good control  History of chronic sinusitis or sinus surgery: no Nonallergic triggers:  denies     Concern for Food Allergy:  Food of concern: raw tomatoes  History of reaction: diffuse pruritus, mouth and throat itchy, tolerates ketchup Previous allergy testing no Eats egg, dairy, wheat, soy, fish, shellfish, peanuts, tree nuts, sesame without  reactions Carries an epinephrine autoinjector: no Has food allergy action plan no   Assessment and Plan: Braleigh is a 62 y.o. female with: Nummular eczema  Seasonal and perennial allergic rhinitis - Plan: Allergy Test  Dermatographic urticaria - Plan: Tryptase, Chronic Urticaria  Other chronic allergic conjunctivitis of both eyes   Plan: Patient Instructions  Food allergy:  - today's skin testing was positive to tomato, negative to other foods - please strictly avoid tomato in all forms  - okay to resume eating all other foods  - for SKIN only reaction, okay to take Benadryl 2 capsules every 6 hours - for SKIN + ANY additional symptoms, OR IF concern for LIFE THREATENING reaction = Epipen Autoinjector EpiPen 0.3 mg. - If using Epinephrine autoinjector, call 911 - A food allergy action plan has been provided and discussed. - Medic Alert identification is recommended.  Chronic Rhinitis Seasonal and Perennial Allergic: - allergy testing today: Weed pollen, tree pollen, dust mite, roach, tobacco leaf,   - Prevention:  - allergen avoidance when possible - consider allergy shots as long term control of your symptoms by teaching your immune system to be more tolerant of your allergy triggers  - Symptom control: - Start Nasal Steroid Spray: Best results if used daily. - Options include Flonase (fluticasone), Nasocort (triamcinolone), Nasonex (mometasome) 1- 2 sprays in each nostril daily.  - All can be purchased over-the-counter if not covered by insurance. - Start Astelin (azelastine) 1-2 sprays in each nostril twice a day as needed for nasal congestion/itchy nose - Start Atrovent (Ipratropium Bromide) 1-2 sprays in each nostril up to 3 times a day as needed for runny nose/post nasal drip/drainage.   - Use less  frequently if airway gets too dry. - Start Singulair (Montelukast) 10mg  nightly.   - Discontinue if nightmares of behavior changes. - Start Antihistamine: Carbinoxamine 4  mg 2-3 times a day  Allergic Conjunctivitis:  - Start Allergy Eye drops-Cromolyn-1 drop each eye up to 4 times daily as needed and Rewetting Drops such as Systane,TheraTears, etc  -Avoid eye drops that say red eye relief as they may contain medications that dry out your eyes.   Nummular Dermatitis:  Daily Care For Maintenance (daily and continue even once eczema controlled) - Recommend hypoallergenic hydrating ointment at least twice daily.  This must be done daily for control of flares. (Great options include Vaseline, CeraVe, Aquaphor, Aveeno, Cetaphil, VaniCream, etc) - Recommend avoiding detergents, soaps or lotions with fragrances/dyes, and instead using products which are hypoallergenic, use second rinse cycle when washing clothes -Wear lose breathable clothing, avoid wool -Avoid extremes of humidity - Limit showers/baths to 5 minutes and use luke warm water instead of hot, pat dry following baths, and apply moisturizer - can use steroid creams as detailed below up to twice weekly for prevention of flares.  For Flares:(add this to maintenance therapy if needed for flares) - Triamcinolone 0.1% to body for moderate flares-apply topically twice daily to red, raised areas of skin, followed by moisturizer - Hydrocortisone 2.5% to face, armpit or groin-apply topically twice daily to red, raised areas of skin, followed by moisturizer - Tacrolimus 0.1% ointment twice daily    Follow up:  4 months, we will contact you with blood work   Thank you so much for letting me partake in your care today.  Don't hesitate to reach out if you have any additional concerns!  Ferol Luz, MD  [facility] [place] [place]  Reducing Pollen Exposure  The American Academy of Allergy, Asthma and Immunology suggests the following steps to reduce your exposure to pollen during allergy seasons.    Do not hang sheets or clothing out to dry; pollen may collect on these items. Do not mow lawns  or spend time around freshly cut grass; mowing stirs up pollen. Keep windows closed at night.  Keep car windows closed while driving. Minimize morning activities outdoors, a time when pollen counts are usually at their highest. Stay indoors as much as possible when pollen counts or humidity is high and on windy days when pollen tends to remain in the air longer. Use air conditioning when possible.  Many air conditioners have filters that trap the pollen spores. Use a HEPA room air filter to remove pollen form the indoor air you breathe.  DUST MITE AVOIDANCE MEASURES:  There are three main measures that need and can be taken to avoid house dust mites:  Reduce accumulation of dust in general -reduce furniture, clothing, carpeting, books, stuffed animals, especially in bedroom  Separate yourself from the dust -use pillow and mattress encasements (can be found at stores such as [employer] or online) -avoid direct exposure to air condition flow -use a HEPA filter device, especially in the bedroom; you can also use a HEPA filter vacuum cleaner -wipe dust with a moist towel instead of a dry towel or broom when cleaning  Decrease mites and/or their secretions -wash clothing and linen and stuffed animals at highest temperature possible, at least every 2 weeks -stuffed animals can also be placed in a bag and put in a freezer overnight  Despite the above measures, it is impossible to eliminate dust mites or their allergen  completely from your home.  With the above measures the burden of mites in your home can be diminished, with the goal of minimizing your allergic symptoms.  Success will be reached only when implementing and using all means together.  Control of Cockroach Allergen  Cockroach allergen has been identified as an important cause of acute attacks of asthma, especially in urban settings.  There are fifty-five species of cockroach that exist in the [place], however only  three, the [place], [place] species produce allergen that can affect patients with Asthma.  Allergens can be obtained from fecal particles, egg casings and secretions from cockroaches.    Remove food sources. Reduce access to water. Seal access and entry points. Spray runways with 0.5-1% Diazinon or Chlorpyrifos Blow boric acid power under stoves and refrigerator. Place bait stations (hydramethylnon) at feeding sites.   Meds ordered this encounter  Medications   EPINEPHrine 0.3 mg/0.3 mL IJ SOAJ injection    Sig: Inject 0.3 mg into the muscle as needed for anaphylaxis.    Dispense:  1 each    Refill:  1   tacrolimus (PROTOPIC) 0.1 % ointment    Sig: Apply topically 2 (two) times daily.    Dispense:  100 g    Refill:  0   triamcinolone ointment (KENALOG) 0.1 %    Sig: Apply 1 Application topically 2 (two) times daily.    Dispense:  30 g    Refill:  0   Carbinoxamine Maleate 4 MG TABS    Sig: Take 1 tablet (4 mg total) by mouth in the morning, at noon, and at bedtime.    Dispense:  90 tablet    Refill:  5   azelastine (ASTELIN) 0.1 % nasal spray    Sig: Place 2 sprays into both nostrils 2 (two) times daily. Use in each nostril as directed    Dispense:  30 mL    Refill:  12   ipratropium (ATROVENT) 0.03 % nasal spray    Sig: Place 2 sprays into both nostrils every 12 (twelve) hours.    Dispense:  30 mL    Refill:  12   cromolyn (OPTICROM) 4 % ophthalmic solution    Sig: Place 1 drop into both eyes 4 (four) times daily.    Dispense:  10 mL    Refill:  12   montelukast (SINGULAIR) 10 MG tablet    Sig: Take 1 tablet (10 mg total) by mouth at bedtime.    Dispense:  30 tablet    Refill:  5   Lab Orders         Tryptase         Chronic Urticaria      Other allergy screening: Asthma: no Rhino conjunctivitis: yes Food allergy: yes Medication allergy: no Hymenoptera allergy: no Urticaria: yes Eczema:yes History of recurrent infections suggestive of  immunodeficency: no  Diagnostics: Skin Testing: Environmental allergy panel and select foods.  adequate controls  Results interpreted by myself and discussed with patient/family.  Airborne Adult Perc - 04/30/23 1527     Time Antigen Placed 1515    Allergen Manufacturer Waynette Buttery    Location Back    Number of Test 55    1. Control-Buffer 50% Glycerol Negative    2. Control-Histamine 4+    3. Bahia Negative    4. French Southern Territories Negative    5. Johnson Negative    6. Kentucky Blue Negative    7. Meadow Fescue Negative    8. Perennial  Rye Negative    9. Timothy Negative    10. Ragweed Mix Negative    11. Cocklebur Negative    12. Plantain,  English Negative    13. Baccharis Negative    14. Dog Fennel Negative    15. [place] Thistle 2+    16. Lamb's Quarters Negative    17. Sheep Sorrell Negative    18. Rough Pigweed Negative    19. Marsh Elder, Rough Negative    20. Mugwort, Common Negative    21. Box, Elder Negative    22. Cedar, red Negative    23. Sweet Gum Negative    24. Pecan Pollen Negative    25. Pine Mix Negative    26. Walnut, Black Pollen Negative    27. Red Mulberry Negative    28. Ash Mix Negative    29. Birch Mix Negative    30. Beech American Negative    31. Cottonwood, [place] Negative    32. Hickory, White Negative    33. Maple Mix Negative    34. Oak, [place] Mix Negative    35. Sycamore Eastern Negative    36. Alternaria Alternata Negative    37. Cladosporium Herbarum Negative    38. Aspergillus Mix Negative    39. Penicillium Mix Negative    40. Bipolaris Sorokiniana (Helminthosporium) Negative    41. Drechslera Spicifera (Curvularia) Negative    42. Mucor Plumbeus Negative    43. Fusarium Moniliforme Negative    44. Aureobasidium Pullulans (pullulara) Negative    45. Rhizopus Oryzae Negative    46. Botrytis Cinera Negative    47. Epicoccum Nigrum Negative    48. Phoma Betae Negative    49. Dust Mite Mix 3+    50. Cat Hair 10,000 BAU/ml Negative     51.  Dog Epithelia Negative    52. Mixed Feathers Negative    53. Horse Epithelia Negative    54. Cockroach, German 3+    55. Tobacco Leaf 2+             13 Food Perc - 04/30/23 1528       Test Information   Time Antigen Placed 1515    Allergen Manufacturer Waynette Buttery    Location Back    Number of allergen test 13      Food   1. Peanut Negative    2. Soybean Negative    3. Wheat Negative    4. Sesame Negative    5. Milk, Cow Negative    6. Casein Negative    7. Egg White, Chicken Negative    8. Shellfish Mix Negative    9. Fish Mix Negative    10. Cashew Negative    11. Walnut Food Negative    12. Almond Negative    13. Hazelnut Negative             Intradermal - 04/30/23 1601     Time Antigen Placed 1601    Allergen Manufacturer Waynette Buttery    Location Arm    Number of Test 14    Control Negative    Bahia Negative    French Southern Territories Negative    Johnson Negative    7 Grass Negative    Ragweed Mix 2+    Weed Mix Negative    Tree Mix 2+    Mold 1 Negative    Mold 2 Negative    Mold 3 Negative    Mold 4 Negative    Cat Negative    Dog Negative  Food Adult Perc - 04/30/23 1500     Time Antigen Placed 1515    Allergen Manufacturer Waynette Buttery    Location Back    Number of allergen test 1    38. Tomato Negative             Past Medical History: Patient Active Problem List   Diagnosis Date Noted   Controlled type 2 diabetes mellitus without complication, without long-term current use of insulin (HCC) 02/28/2023   Nummular eczema 02/28/2023   Endometrial thickening on ultrasound 02/05/2023   Fibroids 01/22/2023   Screening mammogram for breast cancer 12/17/2022   Arthralgia of both hands 07/26/2022   Vitamin D deficiency 07/25/2022   Pure hypercholesterolemia 04/24/2022   Cervical radiculopathy 02/28/2022   Hernia of abdominal wall 12/26/2021   Hematest positive stools 11/30/2021   Menopausal symptoms 04/23/2021   PMB (postmenopausal bleeding)  12/21/2020   Borderline diabetes 12/23/2017   Chronic bilateral low back pain with left-sided sciatica 07/31/2017   Gastroesophageal reflux disease 07/31/2017   Seasonal allergies 07/31/2017   Hypertension 07/31/2017   Past Medical History:  Diagnosis Date   Allergy    Arthritis    back and ankle   Borderline diabetes 12/23/2017   GERD (gastroesophageal reflux disease)    H/O seasonal allergies    Hiatal hernia    Hypertension    Past Surgical History: Past Surgical History:  Procedure Laterality Date   ANKLE SURGERY Left    CHOLECYSTECTOMY     HYSTEROSCOPY WITH D & C N/A 03/27/2021   Procedure: DILATATION AND CURETTAGE /HYSTEROSCOPY;  Surgeon: Hermina Staggers, MD;  Location: [facility] OR;  Service: Gynecology;  Laterality: N/A;   TONSILLECTOMY     TUBAL LIGATION     Medication List:  Current Outpatient Medications  Medication Sig Dispense Refill   azelastine (ASTELIN) 0.1 % nasal spray Place 2 sprays into both nostrils 2 (two) times daily. Use in each nostril as directed 30 mL 12   Bioflavonoid Products (BIOFLEX) TABS See admin instructions.     Carbinoxamine Maleate 4 MG TABS Take 1 tablet (4 mg total) by mouth in the morning, at noon, and at bedtime. 90 tablet 5   clotrimazole-betamethasone (LOTRISONE) cream APPLY TO AFFECTED AREA DAILY 30 g 0   cromolyn (OPTICROM) 4 % ophthalmic solution Place 1 drop into both eyes 4 (four) times daily. 10 mL 12   ELDERBERRY PO Take 1 tablet by mouth daily.     EPINEPHrine 0.3 mg/0.3 mL IJ SOAJ injection Inject 0.3 mg into the muscle as needed for anaphylaxis. 1 each 1   fexofenadine (ALLEGRA) 180 MG tablet Take 180 mg by mouth daily.     gabapentin (NEURONTIN) 300 MG capsule Take 1 capsule (300 mg total) by mouth 2 (two) times daily. Start 1 capsule at bedtime for 3 days, may increase to twice a day 90 capsule 3   ibuprofen (ADVIL) 800 MG tablet Take 1 tablet (800 mg total) by mouth every 8 (eight) hours as needed. 30 tablet 0   ipratropium  (ATROVENT) 0.03 % nasal spray Place 2 sprays into both nostrils every 12 (twelve) hours. 30 mL 12   levocetirizine (XYZAL) 5 MG tablet Take 1 tablet (5 mg total) by mouth every evening. 90 tablet 3   MELATONIN PO Take 1 tablet by mouth at bedtime as needed (sleep).     metoprolol tartrate (LOPRESSOR) 25 MG tablet Take 1 tablet (25 mg total) by mouth 2 (two) times daily. 60 tablet 3  MIRALAX 17 GM/SCOOP powder Take 1 Container by mouth daily.     montelukast (SINGULAIR) 10 MG tablet Take 1 tablet (10 mg total) by mouth at bedtime. 30 tablet 5   omeprazole (PRILOSEC) 40 MG capsule Take 1 capsule (40 mg total) by mouth daily. 90 capsule 1   rosuvastatin (CRESTOR) 20 MG tablet Take 1 tablet (20 mg total) by mouth daily. 90 tablet 1   tacrolimus (PROTOPIC) 0.1 % ointment Apply topically 2 (two) times daily. 100 g 0   triamcinolone ointment (KENALOG) 0.1 % Apply 1 Application topically 2 (two) times daily. 30 g 0   Vitamin D, Ergocalciferol, (DRISDOL) 1.25 MG (50000 UNIT) CAPS capsule Take 1 capsule (50,000 Units total) by mouth every 7 (seven) days. 12 capsule 1   No current facility-administered medications for this visit.   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   Tobacco Use   Smoking status: Never    Passive exposure: Never   Smokeless tobacco: Never  Vaping Use   Vaping status: Never Used  Substance and Sexual Activity   Alcohol use: Yes    Comment: occasional wine   Drug use: No   Sexual activity:  Not Currently    Birth control/protection: Surgical    Comment: tubal ligation per pt.  Other Topics Concern   Not on file  Social History Narrative   Has one son    Social Determinants of Health   Financial Resource Strain: Low Risk  (02/27/2023)   Overall Financial Resource Strain (CARDIA)    Difficulty of Paying Living Expenses: Not hard at all  Food Insecurity: No Food Insecurity (02/27/2023)   Hunger Vital Sign    Worried About Running Out of Food in the Last Year: Never true    Ran Out of Food in the Last Year: Never true  Transportation Needs: No Transportation Needs (02/27/2023)   PRAPARE - Administrator, Civil Service (Medical): No    Lack of Transportation (Non-Medical): No  Physical Activity: Unknown (02/27/2023)   Exercise Vital Sign    Days of Exercise per Week: 0 days    Minutes of Exercise per Session: Not on file  Stress: No Stress Concern Present (02/27/2023)   [employer] of Occupational Health - Occupational Stress Questionnaire    Feeling of Stress : Not at all  Social Connections: Moderately Isolated (02/27/2023)   Social Connection and Isolation Panel [NHANES]    Frequency of Communication with Friends and Family: More than three times a week    Frequency of Social Gatherings with Friends and Family: Three times a week    Attends Religious Services: More than 4 times per year    Active Member of Clubs or Organizations: No    Attends Banker Meetings: Not on file    Marital Status: Never married   Lives in a townhome that is 62 years old.  There are no are roaches in the house and bed is  2 feet off floor.  Dust mite precautions on bed but not pillows.  Not exposed to fumes, chemicals or dust, no HEPA filter in the home and [place] an interstate or industrial area. Smoking: No exposure Occupation: Theatre manager HistorySurveyor, minerals in the house: no Engineer, civil (consulting) in the family room: no Carpet in the bedroom:  no Heating: electric Cooling: central Pet: no  Family History: Family History  Problem Relation Age of Onset   Cancer Mother        breast   Breast cancer Mother    Breast cancer Maternal Aunt    Diabetes Maternal Aunt    Hypertension Maternal Grandmother    Colon cancer Neg Hx    Rectal cancer Neg Hx    Esophageal cancer Neg Hx    Stomach cancer Neg Hx    Colon polyps Neg Hx      ROS: All others negative except as noted per HPI.   Objective: BP 134/86   Pulse 83   Temp 97.7 F (36.5 C) (Temporal)   Resp 17   Ht 5' 7.5" (1.715 m)   Wt 238 lb 4.8 oz (108.1 kg)   SpO2 100%   BMI 36.77 kg/m  Body mass index is 36.77 kg/m.  General Appearance:  Alert, cooperative, no distress, appears stated age  Head:  Normocephalic, without obvious abnormality, atraumatic  Eyes:  Conjunctiva clear, EOM's intact  Nose: Nares normal,  erythematous nasal mucosa, no rhinorrhea, no visible anterior polyps, and septum midline  Throat: Lips, tongue normal; teeth and gums normal, + cobblestoning  Neck: Supple, symmetrical  Lungs:   clear to auscultation bilaterally, Respirations unlabored, no coughing  Heart:  regular rate and rhythm and no murmur, Appears well perfused  Extremities: No edema  Skin: Skin color, texture, turgor normal,  hyperpigmented flat patches on stomach and left arm   Neurologic: No gross deficits   The plan was reviewed with the patient/family, and all questions/concerned were addressed.  It was my pleasure to see Felicia Chase today and participate in her care. Please feel free to contact me with any questions or concerns.  Sincerely,  Ferol Luz, MD Allergy & Immunology  [facility] office: [phone] [facility] office: [phone]

## 2023-04-30 NOTE — Patient Instructions (Addendum)
Food allergy:  - today's skin testing was positive to tomato, negative to other foods - please strictly avoid tomato in all forms  - okay to resume eating all other foods  - for SKIN only reaction, okay to take Benadryl 2 capsules every 6 hours - for SKIN + ANY additional symptoms, OR IF concern for LIFE THREATENING reaction = Epipen Autoinjector EpiPen 0.3 mg. - If using Epinephrine autoinjector, call 911 - A food allergy action plan has been provided and discussed. - Medic Alert identification is recommended.  Chronic Rhinitis Seasonal and Perennial Allergic: - allergy testing today: Weed pollen, tree pollen, dust mite, roach, tobacco leaf,   - Prevention:  - allergen avoidance when possible - consider allergy shots as long term control of your symptoms by teaching your immune system to be more tolerant of your allergy triggers  - Symptom control: - Start Nasal Steroid Spray: Best results if used daily. - Options include Flonase (fluticasone), Nasocort (triamcinolone), Nasonex (mometasome) 1- 2 sprays in each nostril daily.  - All can be purchased over-the-counter if not covered by insurance. - Start Astelin (azelastine) 1-2 sprays in each nostril twice a day as needed for nasal congestion/itchy nose - Start Atrovent (Ipratropium Bromide) 1-2 sprays in each nostril up to 3 times a day as needed for runny nose/post nasal drip/drainage.   - Use less frequently if airway gets too dry. - Start Singulair (Montelukast) 10mg  nightly.   - Discontinue if nightmares of behavior changes. - Start Antihistamine: Carbinoxamine 4 mg 2-3 times a day  Allergic Conjunctivitis:  - Start Allergy Eye drops-Cromolyn-1 drop each eye up to 4 times daily as needed and Rewetting Drops such as Systane,TheraTears, etc  -Avoid eye drops that say red eye relief as they may contain medications that dry out your eyes.   Nummular Dermatitis:  Daily Care For Maintenance (daily and continue even once eczema  controlled) - Recommend hypoallergenic hydrating ointment at least twice daily.  This must be done daily for control of flares. (Great options include Vaseline, CeraVe, Aquaphor, Aveeno, Cetaphil, VaniCream, etc) - Recommend avoiding detergents, soaps or lotions with fragrances/dyes, and instead using products which are hypoallergenic, use second rinse cycle when washing clothes -Wear lose breathable clothing, avoid wool -Avoid extremes of humidity - Limit showers/baths to 5 minutes and use luke warm water instead of hot, pat dry following baths, and apply moisturizer - can use steroid creams as detailed below up to twice weekly for prevention of flares.  For Flares:(add this to maintenance therapy if needed for flares) - Triamcinolone 0.1% to body for moderate flares-apply topically twice daily to red, raised areas of skin, followed by moisturizer - Hydrocortisone 2.5% to face, armpit or groin-apply topically twice daily to red, raised areas of skin, followed by moisturizer - Tacrolimus 0.1% ointment twice daily    Follow up:  4 months, we will contact you with blood work   Thank you so much for letting me partake in your care today.  Don't hesitate to reach out if you have any additional concerns!  Ferol Luz, MD  [facility] [place], [place]  Reducing Pollen Exposure  The American Academy of Allergy, Asthma and Immunology suggests the following steps to reduce your exposure to pollen during allergy seasons.    Do not hang sheets or clothing out to dry; pollen may collect on these items. Do not mow lawns or spend time around freshly cut grass; mowing stirs up pollen. Keep windows closed at night.  Keep car windows closed while driving. Minimize morning activities outdoors, a time when pollen counts are usually at their highest. Stay indoors as much as possible when pollen counts or humidity is high and on windy days when pollen tends to remain in the air longer. Use  air conditioning when possible.  Many air conditioners have filters that trap the pollen spores. Use a HEPA room air filter to remove pollen form the indoor air you breathe.  DUST MITE AVOIDANCE MEASURES:  There are three main measures that need and can be taken to avoid house dust mites:  Reduce accumulation of dust in general -reduce furniture, clothing, carpeting, books, stuffed animals, especially in bedroom  Separate yourself from the dust -use pillow and mattress encasements (can be found at stores such as [employer] or online) -avoid direct exposure to air condition flow -use a HEPA filter device, especially in the bedroom; you can also use a HEPA filter vacuum cleaner -wipe dust with a moist towel instead of a dry towel or broom when cleaning  Decrease mites and/or their secretions -wash clothing and linen and stuffed animals at highest temperature possible, at least every 2 weeks -stuffed animals can also be placed in a bag and put in a freezer overnight  Despite the above measures, it is impossible to eliminate dust mites or their allergen completely from your home.  With the above measures the burden of mites in your home can be diminished, with the goal of minimizing your allergic symptoms.  Success will be reached only when implementing and using all means together.  Control of Cockroach Allergen  Cockroach allergen has been identified as an important cause of acute attacks of asthma, especially in urban settings.  There are fifty-five species of cockroach that exist in the [place], however only three, the [place], [place] species produce allergen that can affect patients with Asthma.  Allergens can be obtained from fecal particles, egg casings and secretions from cockroaches.    Remove food sources. Reduce access to water. Seal access and entry points. Spray runways with 0.5-1% Diazinon or Chlorpyrifos Blow boric acid power under stoves and  refrigerator. Place bait stations (hydramethylnon) at feeding sites.

## 2023-05-06 LAB — CHRONIC URTICARIA: cu index: 6 (ref <10)

## 2023-05-06 LAB — TRYPTASE: Tryptase: 4.9 ug/L (ref 2.2–13.2)

## 2023-05-09 NOTE — Progress Notes (Signed)
Blood work for chronic hives and over active allergy cells (mast cells) was normal.  This is great news.  Can someone let patient know?

## 2023-05-16 ENCOUNTER — Other Ambulatory Visit: Payer: Self-pay | Admitting: Nurse Practitioner

## 2023-05-23 ENCOUNTER — Telehealth: Payer: Self-pay

## 2023-05-23 NOTE — Telephone Encounter (Signed)
Pt informed of results and montelukast gives her a horrible headache once she stopped it she was fine. She just wanted to let you know.

## 2023-05-23 NOTE — Telephone Encounter (Signed)
Pt informed and stated understanding

## 2023-07-04 ENCOUNTER — Encounter: Payer: Self-pay | Admitting: Nurse Practitioner

## 2023-07-04 ENCOUNTER — Ambulatory Visit: Payer: BC Managed Care – PPO | Admitting: Nurse Practitioner

## 2023-07-04 VITALS — BP 132/80 | HR 76 | Temp 97.8°F | Wt 235.0 lb

## 2023-07-04 DIAGNOSIS — G8929 Other chronic pain: Secondary | ICD-10-CM | POA: Diagnosis not present

## 2023-07-04 DIAGNOSIS — M545 Low back pain, unspecified: Secondary | ICD-10-CM

## 2023-07-04 DIAGNOSIS — H6121 Impacted cerumen, right ear: Secondary | ICD-10-CM | POA: Diagnosis not present

## 2023-07-04 NOTE — Progress Notes (Unsigned)
   Acute Office Visit  Subjective:     Patient ID: Felicia Chase, female    DOB: [birth year], 62 y.o.   MRN: [id number]  Chief Complaint  Patient presents with   Ear Fullness    Right and left ear fullness    HPI Patient is in today for bilateral ear fullness and feeling like it is filled with wax.   She states this has been going on for the last month. She denies ear pain, URI symptoms, and trouble hearing. She has not tried anything at home.   She has also been experiencing ongoing back pain. She is interested in a referral to Dr. Allena Katz with pain management. She states that the medications are currently not helping.   ROS See pertinent positives and negatives per HPI.     Objective:    BP 132/80 (BP Location: Left Arm, Patient Position: Sitting, Cuff Size: Large)   Pulse 76   Temp 97.8 F (36.6 C) (Temporal)   Wt 235 lb (106.6 kg)   SpO2 97%   BMI 36.26 kg/m  BP Readings from Last 3 Encounters:  07/04/23 132/80  04/30/23 134/86  02/27/23 136/88   Wt Readings from Last 3 Encounters:  07/04/23 235 lb (106.6 kg)  04/30/23 238 lb 4.8 oz (108.1 kg)  02/27/23 241 lb 3.2 oz (109.4 kg)     Physical Exam Vitals and nursing note reviewed.  Constitutional:      General: She is not in acute distress.    Appearance: Normal appearance.  HENT:     Head: Normocephalic.     Right Ear: External ear normal. There is impacted cerumen.     Left Ear: Tympanic membrane, ear canal and external ear normal.  Eyes:     Conjunctiva/sclera: Conjunctivae normal.  Pulmonary:     Effort: Pulmonary effort is normal.  Musculoskeletal:     Cervical back: Normal range of motion.  Skin:    General: Skin is warm.  Neurological:     General: No focal deficit present.     Mental Status: She is alert and oriented to person, place, and time.  Psychiatric:        Mood and Affect: Mood normal.        Behavior: Behavior normal.        Thought Content: Thought content normal.         Judgment: Judgment normal.       Assessment & Plan:   Problem List Items Addressed This Visit       Other   Chronic low back pain    Chronic, not controlled. She states that the gabapentin is not helping as much as it was. Will place referral to pain management with Dr. Allena Katz per her request.       Relevant Orders   Ambulatory referral to Pain Clinic   Other Visit Diagnoses     Impacted cerumen of right ear    -  Primary   After obtaining verbal consent, right ear irrigated with good results. Tolerated well. Follow-up with any cocnerns.       No orders of the defined types were placed in this encounter.   Return in about 4 weeks (around 08/01/2023) for CPE.  Gerre Scull, NP

## 2023-07-04 NOTE — Patient Instructions (Signed)
It was great to see you!  I have placed a referral to Dr. Allena Katz with pain management   Let's follow-up in 1 month, sooner if you have concerns.  If a referral was placed today, you will be contacted for an appointment. Please note that routine referrals can sometimes take up to 3-4 weeks to process. Please call our office if you haven't heard anything after this time frame.  Take care,  Rodman Pickle, NP

## 2023-07-05 ENCOUNTER — Encounter: Payer: Self-pay | Admitting: Nurse Practitioner
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
## 2023-07-05 NOTE — Assessment & Plan Note (Signed)
Chronic, not controlled. She states that the gabapentin is not helping as much as it was. Will place referral to pain management with Dr. Allena Katz per her request.

## 2023-07-28 ENCOUNTER — Ambulatory Visit (INDEPENDENT_AMBULATORY_CARE_PROVIDER_SITE_OTHER): Payer: BC Managed Care – PPO | Admitting: Nurse Practitioner

## 2023-07-28 ENCOUNTER — Encounter: Payer: Self-pay | Admitting: Nurse Practitioner

## 2023-07-28 VITALS — BP 120/82 | HR 81 | Temp 97.5°F | Ht 67.5 in | Wt 232.2 lb

## 2023-07-28 DIAGNOSIS — E119 Type 2 diabetes mellitus without complications: Secondary | ICD-10-CM

## 2023-07-28 DIAGNOSIS — G8929 Other chronic pain: Secondary | ICD-10-CM

## 2023-07-28 DIAGNOSIS — E7800 Pure hypercholesterolemia, unspecified: Secondary | ICD-10-CM

## 2023-07-28 DIAGNOSIS — K219 Gastro-esophageal reflux disease without esophagitis: Secondary | ICD-10-CM

## 2023-07-28 DIAGNOSIS — Z0000 Encounter for general adult medical examination without abnormal findings: Secondary | ICD-10-CM | POA: Insufficient documentation

## 2023-07-28 DIAGNOSIS — I10 Essential (primary) hypertension: Secondary | ICD-10-CM

## 2023-07-28 DIAGNOSIS — E559 Vitamin D deficiency, unspecified: Secondary | ICD-10-CM | POA: Diagnosis not present

## 2023-07-28 DIAGNOSIS — M5442 Lumbago with sciatica, left side: Secondary | ICD-10-CM

## 2023-07-28 DIAGNOSIS — B3731 Acute candidiasis of vulva and vagina: Secondary | ICD-10-CM

## 2023-07-28 LAB — COMPREHENSIVE METABOLIC PANEL
ALT: 13 U/L (ref 0–35)
AST: 15 U/L (ref 0–37)
Albumin: 4.6 g/dL (ref 3.5–5.2)
Alkaline Phosphatase: 78 U/L (ref 39–117)
BUN: 17 mg/dL (ref 6–23)
CO2: 28 meq/L (ref 19–32)
Calcium: 9.4 mg/dL (ref 8.4–10.5)
Chloride: 105 meq/L (ref 96–112)
Creatinine, Ser: 0.99 mg/dL (ref 0.40–1.20)
GFR: 61.31 mL/min (ref >60.00)
Glucose, Bld: 100 mg/dL — ABNORMAL HIGH (ref 70–99)
Potassium: 4.4 meq/L (ref 3.5–5.1)
Sodium: 141 meq/L (ref 135–145)
Total Bilirubin: 0.4 mg/dL (ref 0.2–1.2)
Total Protein: 7.2 g/dL (ref 6.0–8.3)

## 2023-07-28 LAB — CBC WITH DIFFERENTIAL/PLATELET
Basophils Absolute: 0 10*3/uL (ref 0.0–0.1)
Basophils Relative: 0.3 % (ref 0.0–3.0)
Eosinophils Absolute: 0 10*3/uL (ref 0.0–0.7)
Eosinophils Relative: 0.4 % (ref 0.0–5.0)
HCT: 37.2 % (ref 36.0–46.0)
Hemoglobin: 12 g/dL (ref 12.0–15.0)
Lymphocytes Relative: 25.1 % (ref 12.0–46.0)
Lymphs Abs: 2.1 10*3/uL (ref 0.7–4.0)
MCHC: 32.2 g/dL (ref 30.0–36.0)
MCV: 84.8 fL (ref 78.0–100.0)
Monocytes Absolute: 0.5 10*3/uL (ref 0.1–1.0)
Monocytes Relative: 6.2 % (ref 3.0–12.0)
Neutro Abs: 5.6 10*3/uL (ref 1.4–7.7)
Neutrophils Relative %: 68 % (ref 43.0–77.0)
Platelets: 220 10*3/uL (ref 150.0–400.0)
RBC: 4.38 Mil/uL (ref 3.87–5.11)
RDW: 14.8 % (ref 11.5–15.5)
WBC: 8.3 10*3/uL (ref 4.0–10.5)

## 2023-07-28 LAB — LIPID PANEL
Cholesterol: 130 mg/dL (ref 0–200)
HDL: 43.2 mg/dL (ref >39.00)
LDL Cholesterol: 71 mg/dL (ref 0–99)
NonHDL: 87.26
Total CHOL/HDL Ratio: 3
Triglycerides: 80 mg/dL (ref 0.0–149.0)
VLDL: 16 mg/dL (ref 0.0–40.0)

## 2023-07-28 LAB — VITAMIN D 25 HYDROXY (VIT D DEFICIENCY, FRACTURES): VITD: 34.82 ng/mL (ref 30.00–100.00)

## 2023-07-28 LAB — HEMOGLOBIN A1C: Hgb A1c MFr Bld: 6.5 % (ref 4.6–6.5)

## 2023-07-28 MED ORDER — OMEPRAZOLE 40 MG PO CPDR: 40.0000 mg | DELAYED_RELEASE_CAPSULE | Freq: Every day | ORAL | 1 refills | Status: DC

## 2023-07-28 MED ORDER — FLUCONAZOLE 150 MG PO TABS: 150.0000 mg | ORAL_TABLET | Freq: Once | ORAL | 0 refills | Status: AC

## 2023-07-28 MED ORDER — METOPROLOL TARTRATE 25 MG PO TABS: 25.0000 mg | ORAL_TABLET | Freq: Two times a day (BID) | ORAL | 1 refills | Status: DC

## 2023-07-28 MED ORDER — ROSUVASTATIN CALCIUM 20 MG PO TABS: 20.0000 mg | ORAL_TABLET | Freq: Every day | ORAL | 1 refills | Status: DC

## 2023-07-28 MED ORDER — IBUPROFEN 800 MG PO TABS: 800.0000 mg | ORAL_TABLET | Freq: Three times a day (TID) | ORAL | 0 refills | Status: AC | PRN

## 2023-07-28 NOTE — Assessment & Plan Note (Signed)
Chronic, not controlled. She has not heard from the pain management referral yet, gave her the contact info to call.

## 2023-07-28 NOTE — Assessment & Plan Note (Signed)
She has not been taking a supplement. Will check vitamin D levels today and treat based on results.

## 2023-07-28 NOTE — Progress Notes (Signed)
BP 120/82 (BP Location: Left Arm, Cuff Size: Large)   Pulse 81   Temp (!) 97.5 F (36.4 C)   Ht 5' 7.5" (1.715 m)   Wt 232 lb 3.2 oz (105.3 kg)   SpO2 97%   BMI 35.83 kg/m    Subjective:    Patient ID: Felicia Chase, female    DOB: [birth year], 62 y.o.   MRN: [id number]  CC: Chief Complaint  Patient presents with   Annual Exam    With fasting lab work, Rx refills and Flexeril    HPI: Felicia Chase is a 62 y.o. female presenting on 07/28/2023 for comprehensive medical examination. Current medical complaints include:none  Menopausal Symptoms: no  Depression and Anxiety Screen done today and results listed below:     07/28/2023    9:33 AM 01/22/2023    5:05 PM 12/17/2022   10:13 AM 08/22/2022    4:36 PM 07/26/2022   11:15 AM  Depression screen PHQ 2/9  Decreased Interest 0 0 0 0 0  Down, Depressed, Hopeless 0 0 0 0 0  PHQ - 2 Score 0 0 0 0 0  Altered sleeping 0 0 0    Tired, decreased energy 0 2 0    Change in appetite 0 1 1    Feeling bad or failure about yourself  0 0 0    Trouble concentrating 1 0 0    Moving slowly or fidgety/restless 0 0 0    Suicidal thoughts 0 0 0    PHQ-9 Score 1 3 1     Difficult doing work/chores Not difficult at all          07/28/2023    9:33 AM 01/22/2023    5:05 PM 12/17/2022   10:13 AM 04/24/2022    1:27 PM  GAD 7 : Generalized Anxiety Score  Nervous, Anxious, on Edge 0 0 0 1  Control/stop worrying 0 0 0 0  Worry too much - different things 0 0 0 1  Trouble relaxing 0 0 1 1  Restless 0 0 0 0  Easily annoyed or irritable 0 0 1 1  Afraid - awful might happen  0 0 0  Total GAD 7 Score  0 2 4  Anxiety Difficulty Not difficult at all   Not difficult at all    The patient does not have a history of falls. I did not complete a risk assessment for falls. A plan of care for falls was not documented.   Past Medical History:  Past Medical History:  Diagnosis Date   Allergy    Arthritis    back and ankle   Borderline  diabetes 12/23/2017   GERD (gastroesophageal reflux disease)    H/O seasonal allergies    Hiatal hernia    Hypertension     Surgical History:  Past Surgical History:  Procedure Laterality Date   ANKLE SURGERY Left    CHOLECYSTECTOMY     HYSTEROSCOPY WITH D & C N/A 03/27/2021   Procedure: DILATATION AND CURETTAGE /HYSTEROSCOPY;  Surgeon: Hermina Staggers, MD;  Location: [facility] OR;  Service: Gynecology;  Laterality: N/A;   TONSILLECTOMY     TUBAL LIGATION      Medications:  Current Outpatient Medications on File Prior to Visit  Medication Sig   azelastine (ASTELIN) 0.1 % nasal spray Place 2 sprays into both nostrils 2 (two) times daily. Use in each nostril as directed   clotrimazole-betamethasone (LOTRISONE) cream APPLY TO AFFECTED AREA DAILY   ipratropium (  ATROVENT) 0.03 % nasal spray Place 2 sprays into both nostrils every 12 (twelve) hours.   MIRALAX 17 GM/SCOOP powder Take 1 Container by mouth daily.   montelukast (SINGULAIR) 10 MG tablet Take 1 tablet (10 mg total) by mouth at bedtime.   Carbinoxamine Maleate 4 MG TABS Take 1 tablet (4 mg total) by mouth in the morning, at noon, and at bedtime. (Patient not taking: Reported on 07/04/2023)   cromolyn (OPTICROM) 4 % ophthalmic solution Place 1 drop into both eyes 4 (four) times daily. (Patient not taking: Reported on 07/28/2023)   EPINEPHrine 0.3 mg/0.3 mL IJ SOAJ injection Inject 0.3 mg into the muscle as needed for anaphylaxis. (Patient not taking: Reported on 07/28/2023)   gabapentin (NEURONTIN) 300 MG capsule Take 1 capsule (300 mg total) by mouth 2 (two) times daily. Start 1 capsule at bedtime for 3 days, may increase to twice a day (Patient not taking: Reported on 07/04/2023)   levocetirizine (XYZAL) 5 MG tablet Take 1 tablet (5 mg total) by mouth every evening. (Patient not taking: Reported on 07/04/2023)   MELATONIN PO Take 1 tablet by mouth at bedtime as needed (sleep). (Patient not taking: Reported on 07/04/2023)   tacrolimus  (PROTOPIC) 0.1 % ointment Apply topically 2 (two) times daily. (Patient not taking: Reported on 07/28/2023)   triamcinolone ointment (KENALOG) 0.1 % Apply 1 Application topically 2 (two) times daily. (Patient not taking: Reported on 07/28/2023)   No current facility-administered medications on file prior to visit.    Allergies:  Allergies  Allergen Reactions   Cefdinir Swelling   Penicillins Anaphylaxis and Rash   Tomato Itching   Cefdinir [Cefdinir] Swelling   Amoxicillin Rash   Latex Rash   Nickel Hives and Rash   Vicodin [Hydrocodone-Acetaminophen] Rash    Social History:  Social History   Socioeconomic History   Marital status: Single    Spouse name: Not on file   Number of children: 1   Years of education: Not on file   Highest education level: Bachelor's degree (e.g., BA, AB, BS)  Occupational History   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   Tobacco Use   Smoking status: Never    Passive exposure: Never   Smokeless tobacco: Never  Vaping Use   Vaping status: Never Used  Substance and Sexual Activity   Alcohol use: Yes    Comment: occasional wine   Drug use: No   Sexual activity: Not Currently    Birth control/protection: Surgical    Comment: tubal ligation per pt.  Other Topics Concern   Not on file  Social History Narrative   Has one son    Social Determinants of Health   Financial Resource Strain: Low Risk  (02/27/2023)   Overall Financial Resource Strain (CARDIA)    Difficulty of Paying Living Expenses: Not hard at all  Food Insecurity: No Food Insecurity (02/27/2023)   Hunger Vital Sign    Worried About Running Out of Food in the Last Year: Never true    Ran Out of Food in the Last Year: Never true  Transportation Needs: No Transportation Needs (02/27/2023)   PRAPARE - Administrator, Civil Service (Medical): No    Lack of Transportation (Non-Medical): No  Physical Activity: Unknown (02/27/2023)   Exercise Vital Sign    Days of Exercise per Week: 0 days     Minutes of Exercise per Session: Not on file  Stress: No Stress Concern Present (02/27/2023)   [employer] of Occupational Health - Occupational  Stress Questionnaire    Feeling of Stress : Not at all  Social Connections: Moderately Isolated (02/27/2023)   Social Connection and Isolation Panel [NHANES]    Frequency of Communication with Friends and Family: More than three times a week    Frequency of Social Gatherings with Friends and Family: Three times a week    Attends Religious Services: More than 4 times per year    Active Member of Clubs or Organizations: No    Attends Banker Meetings: Not on file    Marital Status: Never married  Intimate Partner Violence: Unknown (12/20/2021)   Received from [employer], [facility]   HITS    Physically Hurt: Not on file    Insult or Talk Down To: Not on file    Threaten Physical Harm: Not on file    Scream or Curse: Not on file   Social History   Tobacco Use  Smoking Status Never   Passive exposure: Never  Smokeless Tobacco Never   Social History   Substance and Sexual Activity  Alcohol Use Yes   Comment: occasional wine    Family History:  Family History  Problem Relation Age of Onset   Cancer Mother        breast   Breast cancer Mother    Breast cancer Maternal Aunt    Diabetes Maternal Aunt    Hypertension Maternal Grandmother    Colon cancer Neg Hx    Rectal cancer Neg Hx    Esophageal cancer Neg Hx    Stomach cancer Neg Hx    Colon polyps Neg Hx     Past medical history, surgical history, medications, allergies, family history and social history reviewed with patient today and changes made to appropriate areas of the chart.   Review of Systems  Constitutional: Negative.   HENT: Negative.    Eyes: Negative.   Respiratory: Negative.    Cardiovascular: Negative.   Gastrointestinal: Negative.   Genitourinary:  Negative for dysuria and urgency.       Vaginal itching  Musculoskeletal:  Positive  for back pain.  Skin: Negative.   Neurological: Negative.   Psychiatric/Behavioral: Negative.     All other ROS negative except what is listed above and in the HPI.      Objective:    BP 120/82 (BP Location: Left Arm, Cuff Size: Large)   Pulse 81   Temp (!) 97.5 F (36.4 C)   Ht 5' 7.5" (1.715 m)   Wt 232 lb 3.2 oz (105.3 kg)   SpO2 97%   BMI 35.83 kg/m   Wt Readings from Last 3 Encounters:  07/28/23 232 lb 3.2 oz (105.3 kg)  07/04/23 235 lb (106.6 kg)  04/30/23 238 lb 4.8 oz (108.1 kg)    Physical Exam Vitals and nursing note reviewed.  Constitutional:      General: She is not in acute distress.    Appearance: Normal appearance.  HENT:     Head: Normocephalic and atraumatic.     Right Ear: Tympanic membrane, ear canal and external ear normal.     Left Ear: Tympanic membrane, ear canal and external ear normal.  Eyes:     Conjunctiva/sclera: Conjunctivae normal.  Cardiovascular:     Rate and Rhythm: Normal rate and regular rhythm.     Pulses: Normal pulses.     Heart sounds: Normal heart sounds.  Pulmonary:     Effort: Pulmonary effort is normal.     Breath sounds: Normal breath sounds.  Abdominal:     Palpations: Abdomen is soft.     Tenderness: There is no abdominal tenderness.  Musculoskeletal:        General: Normal range of motion.     Cervical back: Normal range of motion and neck supple.     Right lower leg: No edema.     Left lower leg: No edema.  Lymphadenopathy:     Cervical: No cervical adenopathy.  Skin:    General: Skin is warm and dry.  Neurological:     General: No focal deficit present.     Mental Status: She is alert and oriented to person, place, and time.     Cranial Nerves: No cranial nerve deficit.     Coordination: Coordination normal.     Gait: Gait normal.  Psychiatric:        Mood and Affect: Mood normal.        Behavior: Behavior normal.        Thought Content: Thought content normal.        Judgment: Judgment normal.      Results for orders placed or performed in visit on 04/30/23  Tryptase  Result Value Ref Range   Tryptase 4.9 2.2 - 13.2 ug/L  Chronic Urticaria  Result Value Ref Range   cu index 6.0 <10      Assessment & Plan:   Problem List Items Addressed This Visit       Cardiovascular and Mediastinum   Hypertension    Chronic, stable.  Continue metoprolol 25 mg twice daily.  Check CMP, CBC today.  Follow-up in 6 months.      Relevant Medications   metoprolol tartrate (LOPRESSOR) 25 MG tablet   rosuvastatin (CRESTOR) 20 MG tablet   Other Relevant Orders   CBC with Differential/Platelet   Comprehensive metabolic panel     Digestive   Gastroesophageal reflux disease    Chronic, stable.  Omeprazole refill sent to the pharmacy.      Relevant Medications   omeprazole (PRILOSEC) 40 MG capsule     Endocrine   Controlled type 2 diabetes mellitus without complication, without long-term current use of insulin (HCC)    Chronic, stable. Well controlled with diet and exercise.  Continue rosuvastatin 20 mg daily for cardiovascular protection.  Check CMP, CBC, and A1c. She had an eye exam, will request records. Declines pneumonia and flu vaccines today. Follow-up in 6 months.      Relevant Medications   rosuvastatin (CRESTOR) 20 MG tablet   Other Relevant Orders   CBC with Differential/Platelet   Comprehensive metabolic panel   Hemoglobin A1c     Other   Chronic low back pain    Chronic, not controlled. She has not heard from the pain management referral yet, gave her the contact info to call.       Relevant Medications   ibuprofen (ADVIL) 800 MG tablet   Pure hypercholesterolemia    Chronic, stable. Continue rosuvastatin 20mg  daily.  Check CMP, CBC, lipid panel today      Relevant Medications   metoprolol tartrate (LOPRESSOR) 25 MG tablet   rosuvastatin (CRESTOR) 20 MG tablet   Other Relevant Orders   CBC with Differential/Platelet   Comprehensive metabolic panel   Lipid  panel   Vitamin D deficiency    She has not been taking a supplement. Will check vitamin D levels today and treat based on results.       Relevant Orders   VITAMIN D 25 Hydroxy (Vit-D Deficiency,  Fractures)   Routine general medical examination at a health care facility - Primary    Health maintenance reviewed and updated. Discussed nutrition, exercise. Check CMP, CBC today. Follow-up 1 year.        Other Visit Diagnoses     Vaginal yeast infection       Relevant Medications   fluconazole (DIFLUCAN) 150 MG tablet        Follow up plan: Return in about 6 months (around 01/25/2024) for Diabetes, htn, hld.   LABORATORY TESTING:  - Pap smear: up to date  IMMUNIZATIONS:   - Tdap: Tetanus vaccination status reviewed: last tetanus booster within 10 years. - Influenza:  declined - Pneumovax:  n/a - Prevnar:  declined - HPV: Not applicable - Shingrix vaccine:  declined  SCREENING: -Mammogram: Up to date  - Colonoscopy: Up to date  - Bone Density: Not applicable   PATIENT COUNSELING:   Advised to take 1 mg of folate supplement per day if capable of pregnancy.   Sexuality: Discussed sexually transmitted diseases, partner selection, use of condoms, avoidance of unintended pregnancy  and contraceptive alternatives.   Advised to avoid cigarette smoking.  I discussed with the patient that most people either abstain from alcohol or drink within safe limits (<=14/week and <=4 drinks/occasion for males, <=7/weeks and <= 3 drinks/occasion for females) and that the risk for alcohol disorders and other health effects rises proportionally with the number of drinks per week and how often a drinker exceeds daily limits.  Discussed cessation/primary prevention of drug use and availability of treatment for abuse.   Diet: Encouraged to adjust caloric intake to maintain  or achieve ideal body weight, to reduce intake of dietary saturated fat and total fat, to limit sodium intake by avoiding  high sodium foods and not adding table salt, and to maintain adequate dietary potassium and calcium preferably from fresh fruits, vegetables, and low-fat dairy products.    stressed the importance of regular exercise  Injury prevention: Discussed safety belts, safety helmets, smoke detector, smoking near bedding or upholstery.   Dental health: Discussed importance of regular tooth brushing, flossing, and dental visits.    NEXT PREVENTATIVE PHYSICAL DUE IN 1 YEAR. Return in about 6 months (around 01/25/2024) for Diabetes, htn, hld.  Valdez Brannan A Jamicah Anstead

## 2023-07-28 NOTE — Patient Instructions (Addendum)
It was great to see you!  We are checking your labs today and will let you know the results via mychart/phone.   I have refilled your medications  Start calcium and vitamin D supplement 600mg /400mg  1 tablets daily.   You can take dramamine as needed for vertigo/dizziness  Let's follow-up in 6 months, sooner if you have concerns.  If a referral was placed today, you will be contacted for an appointment. Please note that routine referrals can sometimes take up to 3-4 weeks to process. Please call our office if you haven't heard anything after this time frame.  Take care,  Rodman Pickle, NP

## 2023-07-28 NOTE — Assessment & Plan Note (Addendum)
Chronic, stable.  Continue rosuvastatin 20 mg daily.  Check CMP, CBC, lipid panel today.

## 2023-07-28 NOTE — Assessment & Plan Note (Signed)
Chronic, stable.  Omeprazole refill sent to the pharmacy. ?

## 2023-07-28 NOTE — Assessment & Plan Note (Signed)
Health maintenance reviewed and updated. Discussed nutrition, exercise. Check CMP, CBC today. Follow-up 1 year.   

## 2023-07-28 NOTE — Assessment & Plan Note (Signed)
Chronic, stable.  Continue metoprolol 25 mg twice daily.  Check CMP, CBC today.  Follow-up in 6 months.

## 2023-07-28 NOTE — Assessment & Plan Note (Signed)
Chronic, stable. Well controlled with diet and exercise.  Continue rosuvastatin 20 mg daily for cardiovascular protection.  Check CMP, CBC, and A1c. She had an eye exam, will request records. Declines pneumonia and flu vaccines today. Follow-up in 6 months.

## 2023-08-06 ENCOUNTER — Telehealth: Payer: Self-pay | Admitting: Nurse Practitioner

## 2023-08-07 MED ORDER — CYCLOBENZAPRINE HCL 10 MG PO TABS: 10.0000 mg | ORAL_TABLET | Freq: Three times a day (TID) | ORAL | 0 refills | Status: DC | PRN

## 2023-08-07 NOTE — Telephone Encounter (Signed)
LVM that Rx approved and sent to pharmacy. 

## 2023-08-12 ENCOUNTER — Telehealth: Payer: Self-pay | Admitting: Nurse Practitioner

## 2023-08-12 NOTE — Telephone Encounter (Signed)
LVM for patient to return call. 

## 2023-08-12 NOTE — Telephone Encounter (Signed)
Please call pt back.

## 2023-08-12 NOTE — Telephone Encounter (Signed)
Pt is wanting a call concerning her most recent lab results. Please advise pt at [phone]

## 2023-08-12 NOTE — Telephone Encounter (Signed)
I called and spoke with patient and notified her of lab results because patient could not get into her Mychart. Patient voiced understanding and I also told patient how to contact Mychart support for help with her to access heMychart.

## 2023-08-12 NOTE — Telephone Encounter (Signed)
I called patient and LVM for patient to return call.

## 2023-08-27 ENCOUNTER — Other Ambulatory Visit: Payer: Self-pay | Admitting: Nurse Practitioner

## 2023-09-04 ENCOUNTER — Telehealth: Payer: Self-pay

## 2023-09-04 NOTE — Telephone Encounter (Signed)
Copied from CRM [id number]. Topic: Clinical - Medical Advice >> Sep 04, 2023  8:40 AM Orinda Kenner C wrote: Reason for CRM: Pt doesn't want diabetes in her record, pt states was not told of this dx, pls c/b [phone], after 10:45 am. Pt is a Runner, broadcasting/film/video.

## 2023-09-04 NOTE — Telephone Encounter (Signed)
LVM for patient to return call. 

## 2023-09-05 ENCOUNTER — Ambulatory Visit: Payer: BC Managed Care – PPO | Admitting: Nurse Practitioner

## 2023-09-05 NOTE — Telephone Encounter (Signed)
LVM for patient to return call. 

## 2023-09-08 ENCOUNTER — Ambulatory Visit: Payer: BC Managed Care – PPO | Admitting: Internal Medicine

## 2023-09-08 NOTE — Telephone Encounter (Signed)
I called and spoke with patient and notified her of Lauren's message and patient wants to schedule an appointment to discuss this further. An appointment made for 11am on 09/16/23.

## 2023-09-16 ENCOUNTER — Telehealth: Payer: Self-pay

## 2023-09-16 ENCOUNTER — Encounter: Payer: Self-pay | Admitting: Nurse Practitioner

## 2023-09-16 ENCOUNTER — Ambulatory Visit: Payer: BC Managed Care – PPO | Admitting: Nurse Practitioner

## 2023-09-16 VITALS — BP 128/82 | HR 78 | Temp 97.8°F | Ht 67.5 in | Wt 234.8 lb

## 2023-09-16 DIAGNOSIS — E119 Type 2 diabetes mellitus without complications: Secondary | ICD-10-CM

## 2023-09-16 DIAGNOSIS — R42 Dizziness and giddiness: Secondary | ICD-10-CM | POA: Diagnosis not present

## 2023-09-16 NOTE — Patient Instructions (Addendum)
 It was great to see you!  Try to limit your carbohydrates to less than 200 grams per day  Make sure you are drinking plenty of water  Let's follow-up at your next scheduled appointment, sooner if you have concerns.  If a referral was placed today, you will be contacted for an appointment. Please note that routine referrals can sometimes take up to 3-4 weeks to process. Please call our office if you haven't heard anything after this time frame.  Take care,  Tinnie Harada, NP

## 2023-09-16 NOTE — Assessment & Plan Note (Signed)
She has been experiencing intermittent light-headedness. Worsened this last weekend with stomach bug. Recent labs were normal. Encouraged fluids and can drink some gatorade with recent diarrhea. Follow-up if not improving.

## 2023-09-16 NOTE — Assessment & Plan Note (Signed)
Chronic, stable. Last A1c was 6.5%. She is currently controlled with diet. Discussed nutrition. She is looking at getting a new eye doctor. Follow-up at next scheduled appointment.

## 2023-09-16 NOTE — Progress Notes (Signed)
 Established Patient Office Visit  Subjective   Patient ID: Felicia Chase, female    DOB: 10-16-1960  Age: 62 y.o. MRN: [id number]  Chief Complaint  Patient presents with   Discuss Recent Diagnosis    Diagnosed as a diabetic    HPI  Felicia Chase is here to discuss diabetes diagnosis.  She would like to discuss her diabetes diagnosis and what she can do to reverse this. She denies chest pain and shortness of breath.   She has also been experiencing some light-headedness intermittently the past few months. It worsened recently when she had a stomach bug this last weekend. She states that she will up some days with this and it is not aggravated by movement. She denies shortness of breath and chest pain.     ROS See pertinent positives and negatives per HPI.    Objective:     BP 128/82 (BP Location: Left Arm, Cuff Size: Large)   Pulse 78   Temp 97.8 F (36.6 C)   Ht 5' 7.5 (1.715 m)   Wt 234 lb 12.8 oz (106.5 kg)   SpO2 98%   BMI 36.23 kg/m  BP Readings from Last 3 Encounters:  09/16/23 128/82  07/28/23 120/82  07/04/23 132/80   Wt Readings from Last 3 Encounters:  09/16/23 234 lb 12.8 oz (106.5 kg)  07/28/23 232 lb 3.2 oz (105.3 kg)  07/04/23 235 lb (106.6 kg)      Physical Exam Vitals and nursing note reviewed.  Constitutional:      General: She is not in acute distress.    Appearance: Normal appearance.  HENT:     Head: Normocephalic.  Eyes:     Conjunctiva/sclera: Conjunctivae normal.  Cardiovascular:     Rate and Rhythm: Normal rate and regular rhythm.     Pulses: Normal pulses.     Heart sounds: Normal heart sounds.  Pulmonary:     Effort: Pulmonary effort is normal.     Breath sounds: Normal breath sounds.  Musculoskeletal:     Cervical back: Normal range of motion.  Skin:    General: Skin is warm.  Neurological:     General: No focal deficit present.     Mental Status: She is alert and oriented to person, place, and time.   Psychiatric:        Mood and Affect: Mood normal.        Behavior: Behavior normal.        Thought Content: Thought content normal.        Judgment: Judgment normal.     The 10-year ASCVD risk score (Arnett DK, et al., 2019) is: 13.3%    Assessment & Plan:   Problem List Items Addressed This Visit       Endocrine   Controlled type 2 diabetes mellitus without complication, without long-term current use of insulin (HCC) - Primary   Chronic, stable. Last A1c was 6.5%. She is currently controlled with diet. Discussed nutrition. She is looking at getting a new eye doctor. Follow-up at next scheduled appointment.         Other   Light headed   She has been experiencing intermittent light-headedness. Worsened this last weekend with stomach bug. Recent labs were normal. Encouraged fluids and can drink some gatorade with recent diarrhea. Follow-up if not improving.        Return if symptoms worsen or fail to improve, for at your next scheduled appointment .   A total of 30 minutes  were spent on this encounter today. When total time is documented, this includes both the face-to-face and non-face-to-face time personally spent before, during and after the visit on the date of the encounter. We specifically discussed diabetes, nutrition, and light-headedness.    Tinnie DELENA Harada, NP

## 2023-09-16 NOTE — Telephone Encounter (Signed)
 Pain referral order was faxed to [phone] with confirmation.

## 2023-09-16 NOTE — Telephone Encounter (Signed)
 Patient was in office today and concerned that she can not reach anyone to make an appointment for pain management. I called [phone] and receptionist said that this is the wrong office and gave me a different number of [phone]. I called and the receptionist did not have a referral for this patient. A fax can sent for referral to [phone].

## 2023-10-08 ENCOUNTER — Telehealth: Payer: Self-pay | Admitting: Gastroenterology

## 2023-10-08 NOTE — Telephone Encounter (Signed)
PT is requesting a call back to discuss her change in bowel and things she can do for a healthier gut. Please advise.

## 2023-10-08 NOTE — Telephone Encounter (Signed)
Returned call to patient. Left patient a detailed vm letting her know that she will need an office visit for evaluation of symptoms. Pt has not been evaluated for "change in bowel habits". Can be scheduled with an APP, advised pt to call back.

## 2023-10-08 NOTE — Telephone Encounter (Signed)
PT returned call. Advised that we would need to see her in office considering it had been a while since her last visit and she because upset and only wants to voice her concerns.

## 2023-11-09 IMAGING — CT CT ABD-PELV W/ CM
2 of 5 series · 16 of 46 positions shown, 18 images · IV contrast (APPLIED)
Comparison: None Available.

CLINICAL DATA: Incisional hernia

EXAM:
CT ABDOMEN AND PELVIS WITH CONTRAST
TECHNIQUE: Multidetector CT imaging of the abdomen and pelvis was performed
using the standard protocol following bolus administration of
intravenous contrast.

[Series 2: axial st · axial · 0.98mm/px · z∈[-374,+26]mm · 13 of 94 slices shown, 15 images]
[im 7/94  soft-tissue]
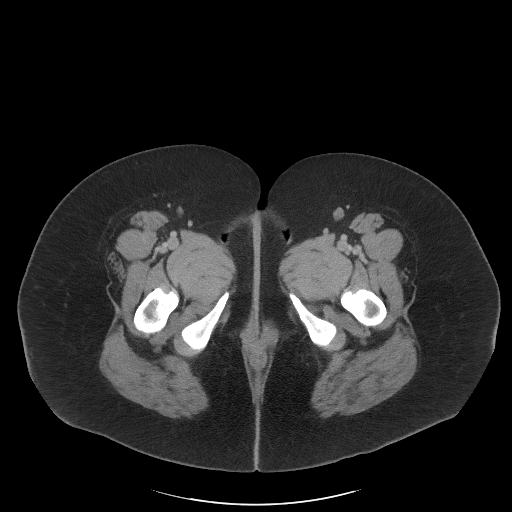
[im 7/94  bone]
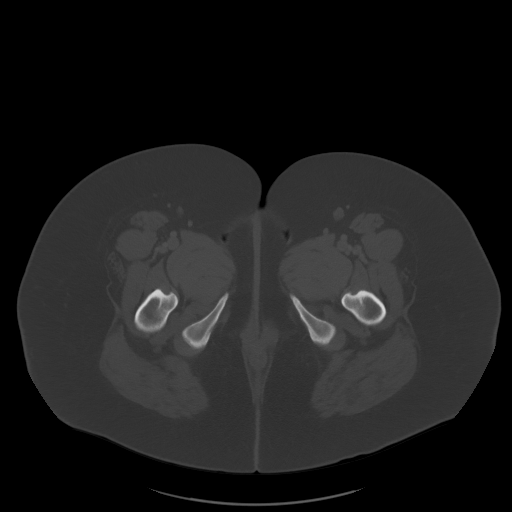
[im 14/94  soft-tissue]
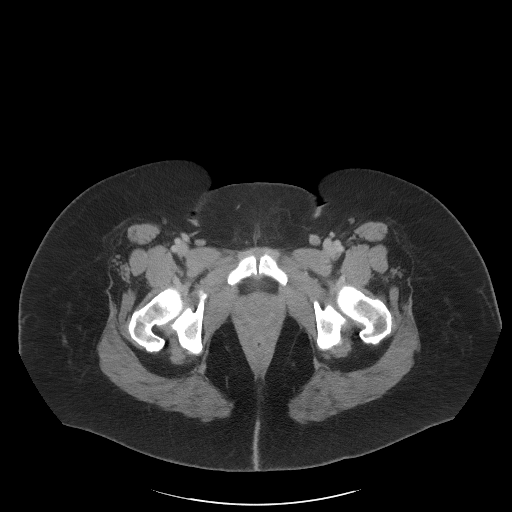
[im 20/94  soft-tissue]
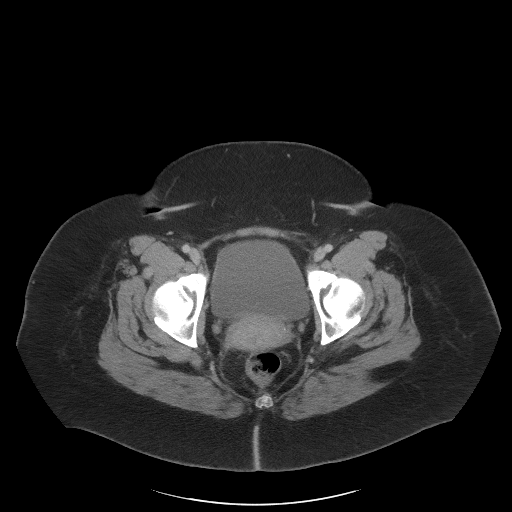
[im 27/94  soft-tissue]
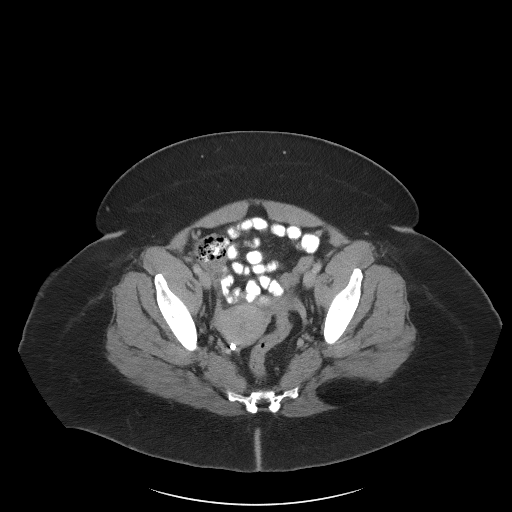
[im 34/94  soft-tissue]
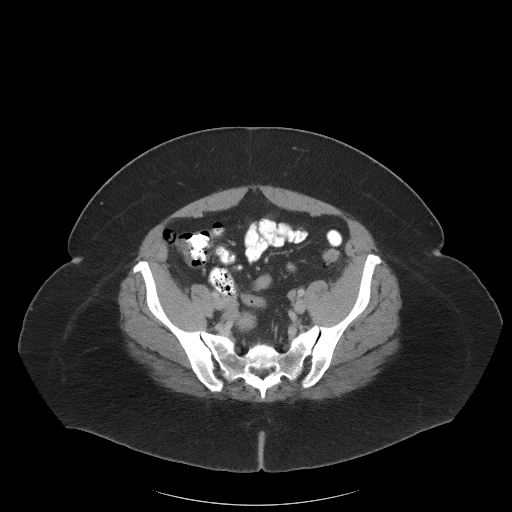
[im 40/94  soft-tissue]
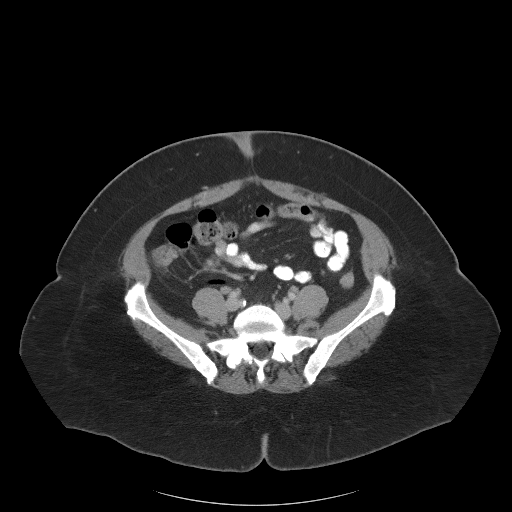
[im 47/94  soft-tissue]
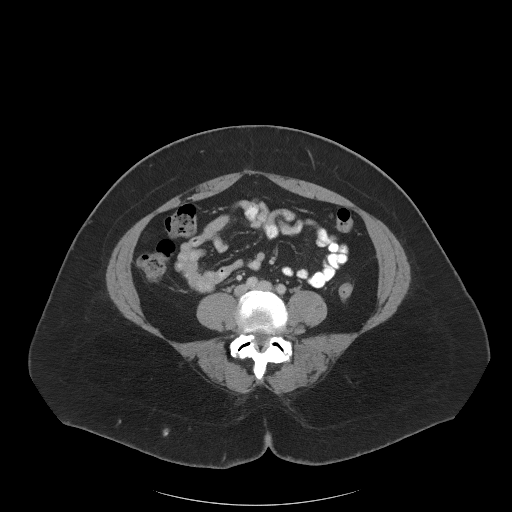
[im 54/94  soft-tissue]
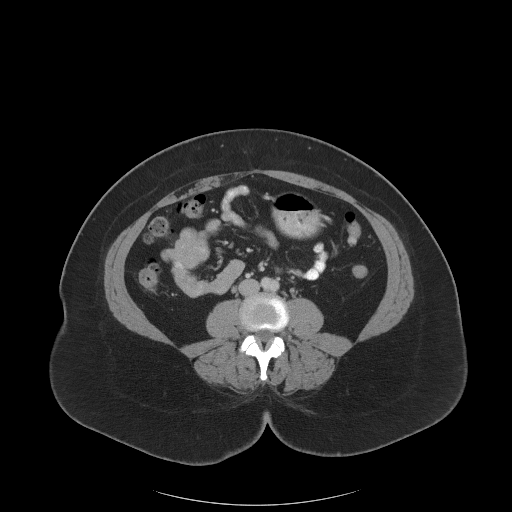
[im 60/94  soft-tissue]
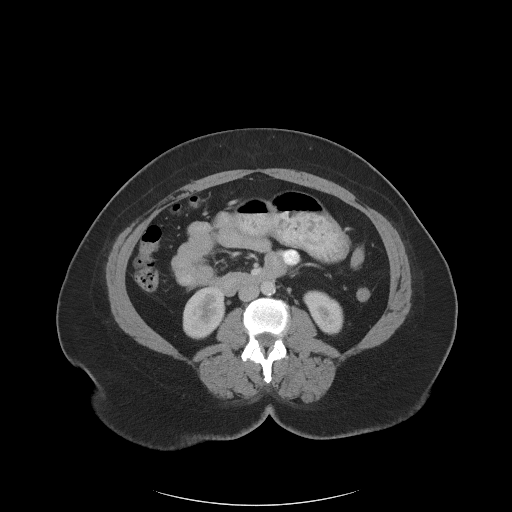
[im 60/94  bone]
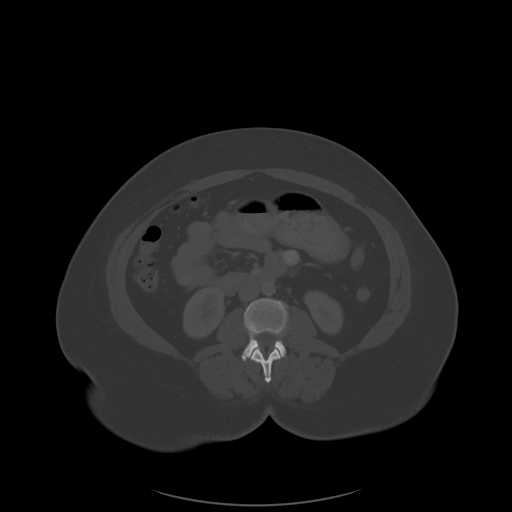
[im 67/94  soft-tissue]
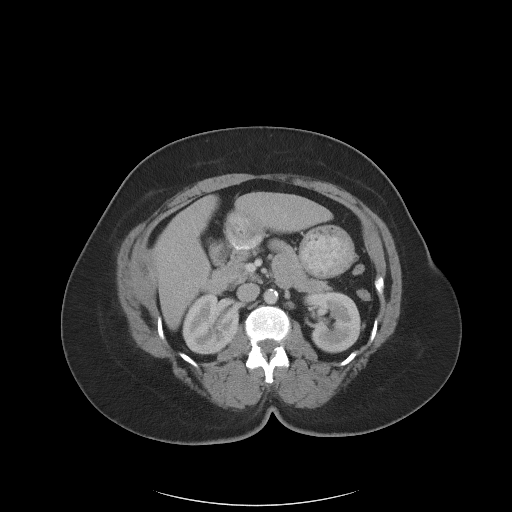
[im 74/94  soft-tissue]
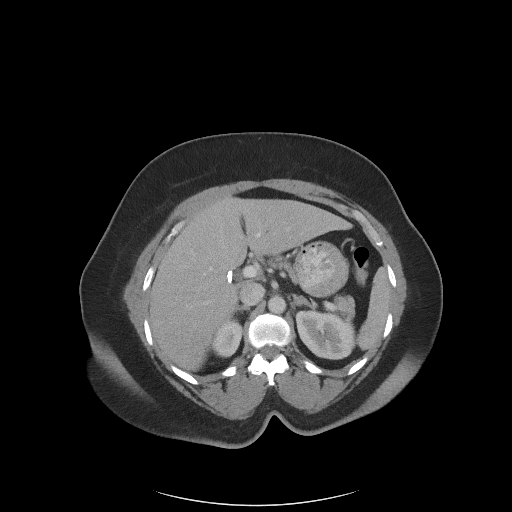
[im 80/94  soft-tissue]
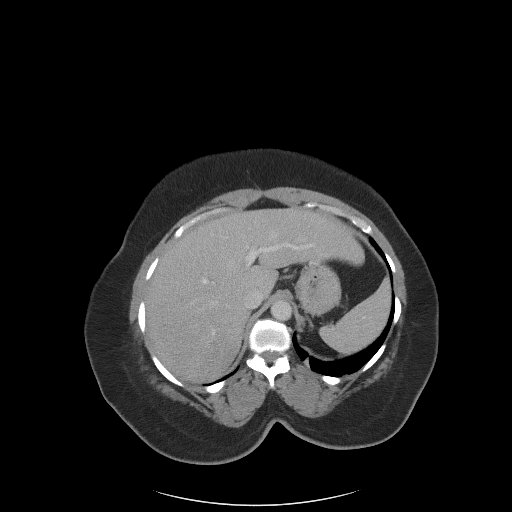
[im 87/94  soft-tissue]
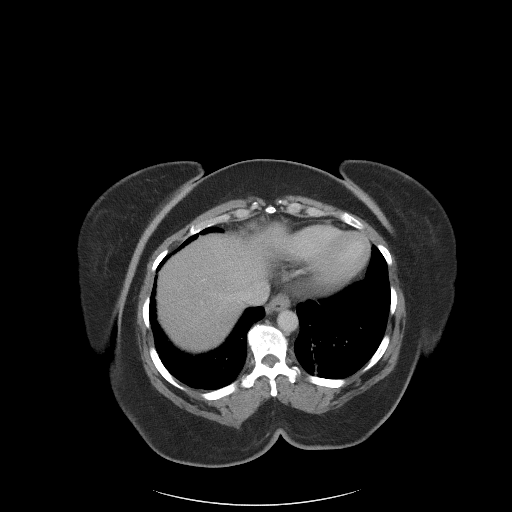

[Series 5: coronal st · coronal · 0.96mm/px · 3 of 120 slices shown]
[im 40/120  soft-tissue]
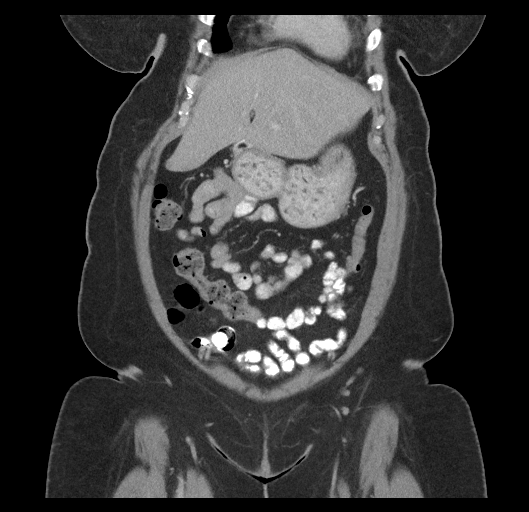
[im 53/120  soft-tissue]
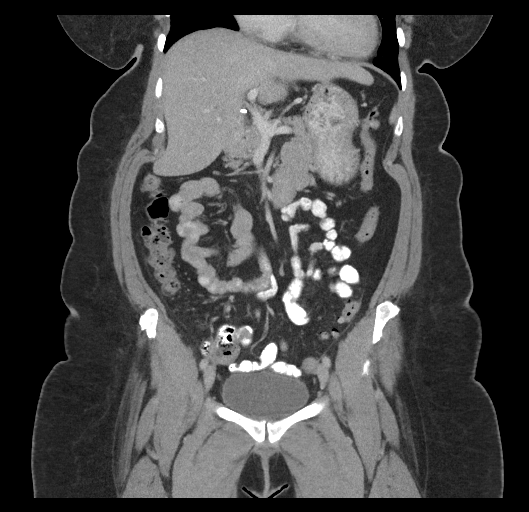
[im 67/120  soft-tissue]
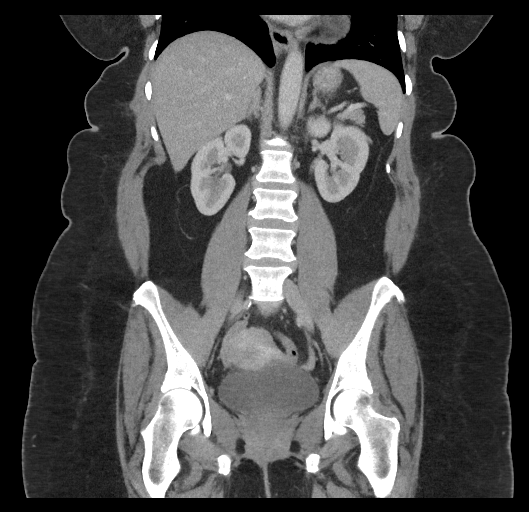

[16 of 46 positions shown; findings below may reference images not displayed]

RADIATION DOSE REDUCTION: This exam was performed according to the
departmental dose-optimization program which includes automated
exposure control, adjustment of the mA and/or kV according to
patient size and/or use of iterative reconstruction technique.

CONTRAST:  100mL OMNIPAQUE IOHEXOL 300 MG/ML  SOLN
FINDINGS: Lower chest: Subsegmental atelectatic changes in the lung bases.

Hepatobiliary: Liver is normal in size and contour with no
suspicious mass visualized. Gallbladder is surgically absent. No
biliary ductal dilatation.

Pancreas: Unremarkable. No pancreatic ductal dilatation or
surrounding inflammatory changes.

Spleen: Normal in size without focal abnormality.

Adrenals/Urinary Tract: Adrenal glands are normal. A few calculi
visualized in the right kidney measuring up to 5 mm in the midpole.
No hydronephrosis or suspicious renal mass identified bilaterally.
Urinary bladder is normal.

Stomach/Bowel: No bowel obstruction, free air or pneumatosis. No
bowel wall edema identified. Colonic diverticulosis. Appendix is
normal.

Vascular/Lymphatic: Aortic atherosclerosis. No enlarged abdominal or
pelvic lymph nodes.

Reproductive: Uterus and bilateral adnexa are unremarkable.

Other: No ascites.  No hernia visualized.

Musculoskeletal: Degenerative changes in the lumbar spine. No
suspicious bony lesions identified.
IMPRESSION: 1. No hernia or acute process identified.
2. Right nephrolithiasis.
3. Colonic diverticulosis.

## 2023-11-19 ENCOUNTER — Ambulatory Visit: Payer: Self-pay | Admitting: Nurse Practitioner

## 2023-11-21 ENCOUNTER — Telehealth: Payer: Self-pay | Admitting: Nurse Practitioner

## 2023-11-21 ENCOUNTER — Encounter: Payer: Self-pay | Admitting: Nurse Practitioner

## 2023-11-21 ENCOUNTER — Ambulatory Visit: Payer: Self-pay | Admitting: Nurse Practitioner

## 2023-11-21 VITALS — BP 138/86 | HR 83 | Temp 98.3°F | Ht 67.5 in | Wt 234.2 lb

## 2023-11-21 DIAGNOSIS — B372 Candidiasis of skin and nail: Secondary | ICD-10-CM

## 2023-11-21 DIAGNOSIS — J22 Unspecified acute lower respiratory infection: Secondary | ICD-10-CM | POA: Diagnosis not present

## 2023-11-21 LAB — POCT INFLUENZA A/B
Influenza A, POC: NEGATIVE
Influenza B, POC: NEGATIVE

## 2023-11-21 LAB — POC COVID19 BINAXNOW: SARS Coronavirus 2 Ag: NEGATIVE

## 2023-11-21 MED ORDER — CLOTRIMAZOLE 1 % EX CREA: 1.0000 | TOPICAL_CREAM | Freq: Two times a day (BID) | CUTANEOUS | 0 refills | Status: DC

## 2023-11-21 MED ORDER — CLOTRIMAZOLE 1 % EX CREA: 1.0000 | TOPICAL_CREAM | Freq: Two times a day (BID) | CUTANEOUS | 6 refills | Status: AC

## 2023-11-21 MED ORDER — FLUCONAZOLE 150 MG PO TABS: ORAL_TABLET | ORAL | 0 refills | Status: DC

## 2023-11-21 MED ORDER — PROMETHAZINE-DM 6.25-15 MG/5ML PO SYRP: 5.0000 mL | ORAL_SOLUTION | Freq: Four times a day (QID) | ORAL | 0 refills | Status: DC | PRN

## 2023-11-21 MED ORDER — DOXYCYCLINE HYCLATE 100 MG PO TABS: 100.0000 mg | ORAL_TABLET | Freq: Two times a day (BID) | ORAL | 0 refills | Status: DC

## 2023-11-21 NOTE — Progress Notes (Signed)
 Acute Office Visit  Subjective:     Patient ID: Felicia Chase, female    DOB: 02/05/1961, 63 y.o.   MRN: [id number]  Chief Complaint  Patient presents with   Cough    Productive cough, burning in chest, head hurts when coughs-since Sunday, runny nose, watery eyes, sinus drainage    HPI Discussed the use of AI scribe software for clinical note transcription with the patient, who gave verbal consent to proceed.  History of Present Illness   The patient, with a history of pneumonia, presents with a cough and chest discomfort that started on Sunday. She describes the cough as 'bad' and associated with a burning sensation in the chest. The cough is productive with streaks of blood. She also reports a headache that occurs with coughing and a sensation of 'somebody's playing' in her throat. She denies shortness of breath. She has tried Benadryl and home remedies such as hot tea with lemon and honey, and lozenges, but these have not provided relief. She reports feeling worse over the past week. 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 She also reports feeling sore all over. She has not received a flu shot this year. She also reports a white discoloration under her breasts, which she has been treating with a cream for yeast.      ROS See pertinent positives and negatives per HPI.     Objective:    BP 138/86 (BP Location: Left Arm, Patient Position: Sitting, Cuff Size: Normal)   Pulse 83   Temp 98.3 F (36.8 C)   Ht 5' 7.5" (1.715 m)   Wt 234 lb 3.2 oz (106.2 kg)   SpO2 99%   BMI 36.14 kg/m    Physical Exam Vitals and nursing note reviewed.  Constitutional:      General: She is not in acute distress.    Appearance: Normal appearance.  HENT:     Head: Normocephalic.     Right Ear: Tympanic membrane, ear canal and external ear normal.     Left Ear: Tympanic membrane, ear canal and external ear normal.     Nose: Congestion present.     Mouth/Throat:     Pharynx: No posterior  oropharyngeal erythema.  Eyes:     Conjunctiva/sclera: Conjunctivae normal.  Cardiovascular:     Rate and Rhythm: Normal rate and regular rhythm.     Pulses: Normal pulses.     Heart sounds: Normal heart sounds.  Pulmonary:     Effort: Pulmonary effort is normal.     Breath sounds: Normal breath sounds.     Comments: Coughing throughout visit Musculoskeletal:     Cervical back: Normal range of motion and neck supple. No tenderness.  Lymphadenopathy:     Cervical: No cervical adenopathy.  Skin:    General: Skin is warm.  Neurological:     General: No focal deficit present.     Mental Status: She is alert and oriented to person, place, and time.  Psychiatric:        Mood and Affect: Mood normal.        Behavior: Behavior normal.        Thought Content: Thought content normal.        Judgment: Judgment normal.     Results for orders placed or performed in visit on 11/21/23  POC COVID-19 BinaxNow  Result Value Ref Range   SARS Coronavirus 2 Ag Negative Negative  POCT Influenza A/B  Result Value Ref Range   Influenza A,  POC Negative Negative   Influenza B, POC Negative Negative        Assessment & Plan:   Problem List Items Addressed This Visit       Respiratory   Lower respiratory infection - Primary   She presents with a worsening cough, chest burning, headache, and a productive cough with blood streaks. Tests are negative for influenza and COVID-19. The condition is likely a viral infection progressing to bacterial, but there is no dyspnea. Her history of pneumonia is noted, though current symptoms do not suggest pneumonia. Start doxycycline twice daily for ten days. Recommend Theraflu or Tylenol Cold and Sinus for congestion. Advise increased fluid intake and rest. Prescribe promethazine for cough, every four hours as needed. She should avoid contact with others until on antibiotics for 24-48 hours. Consider a chest x-ray if there is no improvement after antibiotics.       Relevant Medications   fluconazole (DIFLUCAN) 150 MG tablet   clotrimazole (LOTRIMIN) 1 % cream   Other Relevant Orders   POC COVID-19 BinaxNow (Completed)   POCT Influenza A/B (Completed)     Musculoskeletal and Integument   Yeast dermatitis   She has a yeast infection likely due to antibiotic use, with a rash under the breast and ineffective current cream. Prescribe clotrimazole cream for topical use and discontinue lotrisone       Relevant Medications   fluconazole (DIFLUCAN) 150 MG tablet   clotrimazole (LOTRIMIN) 1 % cream   Meds ordered this encounter  Medications   doxycycline (VIBRA-TABS) 100 MG tablet    Sig: Take 1 tablet (100 mg total) by mouth 2 (two) times daily.    Dispense:  20 tablet    Refill:  0   fluconazole (DIFLUCAN) 150 MG tablet    Sig: Take 1 tablet after finishing antibiotic and a second in 3 days if needed    Dispense:  2 tablet    Refill:  0   promethazine-dextromethorphan (PROMETHAZINE-DM) 6.25-15 MG/5ML syrup    Sig: Take 5 mLs by mouth 4 (four) times daily as needed for cough.    Dispense:  118 mL    Refill:  0   DISCONTD: clotrimazole (LOTRIMIN) 1 % cream    Sig: Apply 1 Application topically 2 (two) times daily.    Dispense:  30 g    Refill:  0   clotrimazole (LOTRIMIN) 1 % cream    Sig: Apply 1 Application topically 2 (two) times daily.    Dispense:  30 g    Refill:  6    Return if symptoms worsen or fail to improve.  Gerre Scull, NP

## 2023-11-21 NOTE — Assessment & Plan Note (Signed)
 She has a yeast infection likely due to antibiotic use, with a rash under the breast and ineffective current cream. Prescribe clotrimazole cream for topical use and discontinue lotrisone

## 2023-11-21 NOTE — Telephone Encounter (Signed)
 I called and spoke with patient and notified her that a work note is available for her to pick up on Monday as requested.

## 2023-11-21 NOTE — Patient Instructions (Signed)
 It was great to see you!  Start doxycycline twice a day for 10 days  Start cough syrup every 4 hours as needed  Start mucinex or theraflu to help with congestion  Start diflucan after finishing antibiotic and a second tablet 3 days later if needed.   Let's follow-up if symptoms worsen or don't improve   Take care,  Rodman Pickle, NP

## 2023-11-21 NOTE — Telephone Encounter (Signed)
 Copied from CRM [id number]. Topic: General - Other >> Nov 21, 2023  3:52 PM Turkey A wrote: Reason for CRM: Patient called and said she forgot to ask for Doctor's Note while at the office. Patient said she would stop by Monday to pick up note. Please call when ready

## 2023-11-21 NOTE — Assessment & Plan Note (Signed)
 She presents with a worsening cough, chest burning, headache, and a productive cough with blood streaks. Tests are negative for influenza and COVID-19. The condition is likely a viral infection progressing to bacterial, but there is no dyspnea. Her history of pneumonia is noted, though current symptoms do not suggest pneumonia. Start doxycycline twice daily for ten days. Recommend Theraflu or Tylenol Cold and Sinus for congestion. Advise increased fluid intake and rest. Prescribe promethazine for cough, every four hours as needed. She should avoid contact with others until on antibiotics for 24-48 hours. Consider a chest x-ray if there is no improvement after antibiotics.

## 2023-11-26 NOTE — Telephone Encounter (Signed)
 Copied from CRM [id number]. Topic: General - Other >> Nov 26, 2023 11:28 AM Gurney Maxin H wrote: Reason for CRM: Patient called to notify office that she will be in today to pickup letter that's at the office for her.

## 2023-11-27 NOTE — Telephone Encounter (Signed)
 Noted.

## 2023-12-10 ENCOUNTER — Telehealth: Payer: Self-pay

## 2023-12-10 NOTE — Telephone Encounter (Signed)
 Copied from CRM [id number]. Topic: General - Other >> Dec 10, 2023 11:28 AM Felicia Chase wrote: Reason for CRM: Had virus , Nurse said cough can last up to 6 week, she stated she is coughing up stuff. While coughing she states he chest burns and her head feels like its going to pop  [id number] - will not be available until 1:15 for 30 minutes

## 2023-12-10 NOTE — Telephone Encounter (Signed)
 Called Pt to schedule OV to assist and evaluate symptoms. Pt last OV was 11/21/2023 for URI. Pt didn't answer, left VM for call back

## 2023-12-10 NOTE — Telephone Encounter (Signed)
 Copied from CRM [id number]. Topic: Appointments - Appointment Scheduling >> Dec 10, 2023  3:54 PM Almira Coaster wrote: Patient/patient representative is calling to schedule an appointment. Refer to attachments for appointment information. 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

## 2023-12-11 ENCOUNTER — Ambulatory Visit: Admitting: Nurse Practitioner

## 2023-12-11 ENCOUNTER — Encounter: Payer: Self-pay | Admitting: Nurse Practitioner

## 2023-12-11 VITALS — BP 144/90 | HR 82 | Temp 98.7°F | Ht 67.5 in | Wt 235.0 lb

## 2023-12-11 DIAGNOSIS — J22 Unspecified acute lower respiratory infection: Secondary | ICD-10-CM | POA: Diagnosis not present

## 2023-12-11 MED ORDER — PREDNISONE 20 MG PO TABS: 40.0000 mg | ORAL_TABLET | Freq: Every day | ORAL | 0 refills | Status: DC

## 2023-12-11 NOTE — Patient Instructions (Signed)
 It was great to see you!  Start prednisone 2 tablets daily with food in the morning  Keep taking cough syrup   Drink plenty of fluids  Let's follow-up if your symptoms worsen or don't improve   Take care,  Rodman Pickle, NP

## 2023-12-11 NOTE — Progress Notes (Unsigned)
 Acute Office Visit  Subjective:     Patient ID: Felicia Chase, female    DOB: Aug 06, 1961, 63 y.o.   MRN: [id number]  Chief Complaint  Patient presents with   Cough    Dry cough with burning in chest from cough, head hurts when coughs    HPI Patient is in today for ongoing cough for 3 weeks.  Discussed the use of AI scribe software for clinical note transcription with the patient, who gave verbal consent to proceed.  History of Present Illness   The patient, with a history of allergies and arthritis, presents with a persistent cough, loss of voice, and a headache. The cough is severe enough to cause a headache and the patient reports bringing up mucus. The patient also mentions feeling tired and overexerted, particularly after physical activity. The patient has been taking promethazine and Delsym for the cough, but reports that the symptoms have not improved significantly.  The patient's voice has become raspy, particularly in the evenings, and the throat feels scratchy. The patient does not report feeling unwell overall, but the symptoms are causing discomfort and affecting daily activities. She finished the doxycycline as prescribed.      ROS See pertinent positives and negatives per HPI.     Objective:    BP (!) 144/90 (BP Location: Left Arm, Cuff Size: Large)   Pulse 82   Temp 98.7 F (37.1 C) (Oral)   Ht 5' 7.5" (1.715 m)   Wt 235 lb (106.6 kg)   SpO2 99%   BMI 36.26 kg/m    Physical Exam Vitals and nursing note reviewed.  Constitutional:      General: She is not in acute distress.    Appearance: Normal appearance.  HENT:     Head: Normocephalic.     Right Ear: Tympanic membrane, ear canal and external ear normal.     Left Ear: Tympanic membrane, ear canal and external ear normal.     Mouth/Throat:     Mouth: Mucous membranes are moist.     Pharynx: No posterior oropharyngeal erythema.  Eyes:     Conjunctiva/sclera: Conjunctivae normal.   Cardiovascular:     Rate and Rhythm: Normal rate and regular rhythm.     Pulses: Normal pulses.     Heart sounds: Normal heart sounds.  Pulmonary:     Effort: Pulmonary effort is normal.     Breath sounds: Normal breath sounds.  Musculoskeletal:     Cervical back: Normal range of motion and neck supple. No tenderness.  Lymphadenopathy:     Cervical: No cervical adenopathy.  Skin:    General: Skin is warm.  Neurological:     General: No focal deficit present.     Mental Status: She is alert and oriented to person, place, and time.  Psychiatric:        Mood and Affect: Mood normal.        Behavior: Behavior normal.        Thought Content: Thought content normal.        Judgment: Judgment normal.      Assessment & Plan:   Problem List Items Addressed This Visit       Respiratory   Lower respiratory infection - Primary   She experiences a persistent cough and hoarseness, worsening in the evenings, with symptoms persisting despite doxycycline. Promethazine has improved her sleep. Headaches and nasal congestion are likely due to post-infectious cough and possible allergic rhinitis. Prescribe prednisone, 40mg  in the morning with  food for 5 days. Continue promethazine for cough management and encourage the use of Mucinex for mucus relief.         Meds ordered this encounter  Medications   predniSONE (DELTASONE) 20 MG tablet    Sig: Take 2 tablets (40 mg total) by mouth daily with breakfast.    Dispense:  10 tablet    Refill:  0    Return if symptoms worsen or fail to improve.  Gerre Scull, NP

## 2023-12-12 NOTE — Assessment & Plan Note (Signed)
 She experiences a persistent cough and hoarseness, worsening in the evenings, with symptoms persisting despite doxycycline. Promethazine has improved her sleep. Headaches and nasal congestion are likely due to post-infectious cough and possible allergic rhinitis. Prescribe prednisone, 40mg  in the morning with food for 5 days. Continue promethazine for cough management and encourage the use of Mucinex for mucus relief.

## 2024-01-30 ENCOUNTER — Ambulatory Visit: Payer: BC Managed Care – PPO | Admitting: Nurse Practitioner

## 2024-02-05 ENCOUNTER — Ambulatory Visit: Payer: BC Managed Care – PPO | Admitting: Nurse Practitioner

## 2024-02-05 ENCOUNTER — Encounter: Payer: Self-pay | Admitting: Nurse Practitioner

## 2024-02-05 VITALS — BP 128/82 | HR 85 | Temp 96.9°F | Ht 67.0 in | Wt 234.6 lb

## 2024-02-05 DIAGNOSIS — H6121 Impacted cerumen, right ear: Secondary | ICD-10-CM

## 2024-02-05 DIAGNOSIS — I10 Essential (primary) hypertension: Secondary | ICD-10-CM | POA: Diagnosis not present

## 2024-02-05 DIAGNOSIS — E119 Type 2 diabetes mellitus without complications: Secondary | ICD-10-CM

## 2024-02-05 DIAGNOSIS — E559 Vitamin D deficiency, unspecified: Secondary | ICD-10-CM | POA: Diagnosis not present

## 2024-02-05 DIAGNOSIS — E7800 Pure hypercholesterolemia, unspecified: Secondary | ICD-10-CM | POA: Diagnosis not present

## 2024-02-05 MED ORDER — MONTELUKAST SODIUM 10 MG PO TABS: 10.0000 mg | ORAL_TABLET | Freq: Every day | ORAL | 1 refills | Status: AC

## 2024-02-05 NOTE — Patient Instructions (Signed)
 It was great to see you!  Try vagisil wash and azo probiotic to help with vaginal odor.   We are checking your labs today and will let you know the results via mychart/phone.   Start taking montelukast  on a daily basis  Let's follow-up in 6 months, sooner if you have concerns.  If a referral was placed today, you will be contacted for an appointment. Please note that routine referrals can sometimes take up to 3-4 weeks to process. Please call our office if you haven't heard anything after this time frame.  Take care,  Rheba Cedar, NP

## 2024-02-05 NOTE — Progress Notes (Signed)
 Established Patient Office Visit  Subjective   Patient ID: Felicia Chase, female    DOB: Apr 26, 1961  Age: 63 y.o. MRN: [id number]  Chief Complaint  Patient presents with   Diabetes   Ear Problem    Says she can not hear out of her right ear    HPI Discussed the use of AI scribe software for clinical note transcription with the patient, who gave verbal consent to proceed.  History of Present Illness   Felicia M Husby "Brian Campanile" is a 63 year old female who presents with muffled hearing in the right ear.  She has experienced muffled hearing in her right ear for two weeks, describing it as 'weird' without associated pain. She suspects earwax buildup due to similar past issues. Chewing gum and pulling on her ear sometimes result in a temporary 'pop', but the muffled sensation returns.  She has diabetes and hypertension with stable blood sugar levels and weight around 234-235 pounds. She is not checking her blood pressure or sugars at home. She denies chest pain, shortness of breath, and numbness/tingling in her feet.   She experiences allergy  symptoms and uses Claritin and Zyrtec , though they seem ineffective. She has used montelukast  previously but ran out. She reports facial itching and redness, attributing them to allergies.       ROS See pertinent positives and negatives per HPI.    Objective:     BP 128/82 (BP Location: Left Arm, Patient Position: Sitting, Cuff Size: Normal)   Pulse 85   Temp (!) 96.9 F (36.1 C) (Temporal)   Ht 5\' 7"  (1.702 m)   Wt 234 lb 9.6 oz (106.4 kg)   SpO2 98%   BMI 36.74 kg/m  BP Readings from Last 3 Encounters:  02/05/24 128/82  12/11/23 (!) 144/90  11/21/23 138/86   Wt Readings from Last 3 Encounters:  02/05/24 234 lb 9.6 oz (106.4 kg)  12/11/23 235 lb (106.6 kg)  11/21/23 234 lb 3.2 oz (106.2 kg)      Physical Exam Vitals and nursing note reviewed.  Constitutional:      General: She is not in acute distress.     Appearance: Normal appearance.  HENT:     Head: Normocephalic.     Right Ear: There is impacted cerumen.     Left Ear: Tympanic membrane, ear canal and external ear normal.  Eyes:     Conjunctiva/sclera: Conjunctivae normal.  Cardiovascular:     Rate and Rhythm: Normal rate and regular rhythm.     Pulses: Normal pulses.     Heart sounds: Normal heart sounds.  Pulmonary:     Effort: Pulmonary effort is normal.     Breath sounds: Normal breath sounds.  Musculoskeletal:     Cervical back: Normal range of motion.  Skin:    General: Skin is warm.  Neurological:     General: No focal deficit present.     Mental Status: She is alert and oriented to person, place, and time.  Psychiatric:        Mood and Affect: Mood normal.        Behavior: Behavior normal.        Thought Content: Thought content normal.        Judgment: Judgment normal.    The 10-year ASCVD risk score (Arnett DK, et al., 2019) is: 13.3%    Assessment & Plan:   Problem List Items Addressed This Visit       Cardiovascular and Mediastinum   Hypertension -  Primary   Chronic, stable.  Continue metoprolol  25 mg twice daily.  Check CMP, CBC today.  Follow-up in 6 months.      Relevant Orders   CBC with Differential/Platelet   Comprehensive metabolic panel with GFR     Endocrine   Controlled type 2 diabetes mellitus without complication, without long-term current use of insulin (HCC)   Chronic, stable. She is currently controlled with diet. Discussed nutrition. She is looking at getting a new eye doctor. Check CMP, CBC, A1c, and urine microalbumin today. Follow-up in 6 months.       Relevant Orders   CBC with Differential/Platelet   Comprehensive metabolic panel with GFR   Hemoglobin A1c   Microalbumin / creatinine urine ratio     Other   Pure hypercholesterolemia   Chronic, stable. Continue rosuvastatin  20mg  daily.  Check CMP, CBC, lipid panel today      Relevant Orders   CBC with Differential/Platelet    Comprehensive metabolic panel with GFR   Lipid panel   Vitamin D  deficiency   Will check vitamin D  levels today and treat based on results.       Relevant Orders   VITAMIN D  25 Hydroxy (Vit-D Deficiency, Fractures)   Other Visit Diagnoses       Impacted cerumen of right ear       After obtaining verbal consent, right ear irrigated with good results. She tolerated the procedure well.       Return in about 6 months (around 08/07/2024) for CPE.    Odette Benjamin, NP

## 2024-02-06 LAB — VITAMIN D 25 HYDROXY (VIT D DEFICIENCY, FRACTURES): VITD: 24.32 ng/mL — ABNORMAL LOW (ref 30.00–100.00)

## 2024-02-06 LAB — COMPREHENSIVE METABOLIC PANEL WITH GFR
ALT: 14 U/L (ref 0–35)
AST: 16 U/L (ref 0–37)
Albumin: 4.6 g/dL (ref 3.5–5.2)
Alkaline Phosphatase: 68 U/L (ref 39–117)
BUN: 18 mg/dL (ref 6–23)
CO2: 26 meq/L (ref 19–32)
Calcium: 9.6 mg/dL (ref 8.4–10.5)
Chloride: 107 meq/L (ref 96–112)
Creatinine, Ser: 0.77 mg/dL (ref 0.40–1.20)
GFR: 82.59 mL/min (ref >60.00)
Glucose, Bld: 103 mg/dL — ABNORMAL HIGH (ref 70–99)
Potassium: 3.7 meq/L (ref 3.5–5.1)
Sodium: 142 meq/L (ref 135–145)
Total Bilirubin: 0.3 mg/dL (ref 0.2–1.2)
Total Protein: 7.3 g/dL (ref 6.0–8.3)

## 2024-02-06 LAB — CBC WITH DIFFERENTIAL/PLATELET
Basophils Absolute: 0.1 10*3/uL (ref 0.0–0.1)
Basophils Relative: 1.1 % (ref 0.0–3.0)
Eosinophils Absolute: 0.1 10*3/uL (ref 0.0–0.7)
Eosinophils Relative: 0.7 % (ref 0.0–5.0)
HCT: 35 % — ABNORMAL LOW (ref 36.0–46.0)
Hemoglobin: 11.5 g/dL — ABNORMAL LOW (ref 12.0–15.0)
Lymphocytes Relative: 31.6 % (ref 12.0–46.0)
Lymphs Abs: 2.5 10*3/uL (ref 0.7–4.0)
MCHC: 32.8 g/dL (ref 30.0–36.0)
MCV: 81.6 fl (ref 78.0–100.0)
Monocytes Absolute: 0.6 10*3/uL (ref 0.1–1.0)
Monocytes Relative: 7.5 % (ref 3.0–12.0)
Neutro Abs: 4.6 10*3/uL (ref 1.4–7.7)
Neutrophils Relative %: 59.1 % (ref 43.0–77.0)
Platelets: 192 10*3/uL (ref 150.0–400.0)
RBC: 4.28 Mil/uL (ref 3.87–5.11)
RDW: 14.8 % (ref 11.5–15.5)
WBC: 7.9 10*3/uL (ref 4.0–10.5)

## 2024-02-06 LAB — LIPID PANEL
Cholesterol: 109 mg/dL (ref 0–200)
HDL: 41.9 mg/dL (ref >39.00)
LDL Cholesterol: 46 mg/dL (ref 0–99)
NonHDL: 66.68
Total CHOL/HDL Ratio: 3
Triglycerides: 103 mg/dL (ref 0.0–149.0)
VLDL: 20.6 mg/dL (ref 0.0–40.0)

## 2024-02-06 LAB — MICROALBUMIN / CREATININE URINE RATIO
Creatinine,U: 188.1 mg/dL
Microalb Creat Ratio: 14.9 mg/g (ref 0.0–30.0)
Microalb, Ur: 2.8 mg/dL — ABNORMAL HIGH (ref 0.0–1.9)

## 2024-02-06 LAB — HEMOGLOBIN A1C: Hgb A1c MFr Bld: 6.5 % (ref 4.6–6.5)

## 2024-02-06 NOTE — Assessment & Plan Note (Signed)
 Chronic, stable. She is currently controlled with diet. Discussed nutrition. She is looking at getting a new eye doctor. Check CMP, CBC, A1c, and urine microalbumin today. Follow-up in 6 months.

## 2024-02-06 NOTE — Assessment & Plan Note (Signed)
Will check vitamin D levels today and treat based on results.

## 2024-02-06 NOTE — Assessment & Plan Note (Signed)
Chronic, stable.  Continue rosuvastatin 20 mg daily.  Check CMP, CBC, lipid panel today.

## 2024-02-06 NOTE — Assessment & Plan Note (Signed)
Chronic, stable.  Continue metoprolol 25 mg twice daily.  Check CMP, CBC today.  Follow-up in 6 months.

## 2024-02-10 ENCOUNTER — Ambulatory Visit: Payer: Self-pay | Admitting: Nurse Practitioner

## 2024-02-10 MED ORDER — LOSARTAN POTASSIUM 25 MG PO TABS
12.5000 mg | ORAL_TABLET | Freq: Every day | ORAL | 1 refills | Status: DC
Start: 2024-02-10 — End: 2024-02-20

## 2024-02-11 NOTE — Telephone Encounter (Signed)
 Copied from CRM [id number]. Topic: Clinical - Lab/Test Results >> Feb 10, 2024  3:59 PM Aisha D wrote: Reason for CRM: Pt was informed of her lab results and 4 week appt was scheduled. Pt would like a call back regarding the protein in urine. Pt wants to know what causes that.

## 2024-02-20 ENCOUNTER — Telehealth (INDEPENDENT_AMBULATORY_CARE_PROVIDER_SITE_OTHER): Admitting: Nurse Practitioner

## 2024-02-20 ENCOUNTER — Encounter: Payer: Self-pay | Admitting: Nurse Practitioner

## 2024-02-20 VITALS — Ht 67.0 in | Wt 234.0 lb

## 2024-02-20 DIAGNOSIS — B3731 Acute candidiasis of vulva and vagina: Secondary | ICD-10-CM | POA: Diagnosis not present

## 2024-02-20 DIAGNOSIS — E119 Type 2 diabetes mellitus without complications: Secondary | ICD-10-CM | POA: Diagnosis not present

## 2024-02-20 DIAGNOSIS — K219 Gastro-esophageal reflux disease without esophagitis: Secondary | ICD-10-CM

## 2024-02-20 DIAGNOSIS — I10 Essential (primary) hypertension: Secondary | ICD-10-CM | POA: Diagnosis not present

## 2024-02-20 MED ORDER — LOSARTAN POTASSIUM 25 MG PO TABS: 12.5000 mg | ORAL_TABLET | Freq: Every day | ORAL | 1 refills | Status: DC

## 2024-02-20 MED ORDER — FLUCONAZOLE 150 MG PO TABS: ORAL_TABLET | ORAL | 0 refills | Status: AC

## 2024-02-20 NOTE — Assessment & Plan Note (Signed)
 Chronic, stable. Urine microalbumin was elevated last visit. Recommend Losartan  for renal protection and blood pressure control. She should take 12.5 mg of losartan , half a tablet daily. Metoprolol  will be reduced to one tablet daily for a week, then discontinued. She was educated on losartan 's role in kidney protection. Continue limiting carbs to less than 200g per day. Follow-up in 3 weeks at next scheduled appointment.

## 2024-02-20 NOTE — Assessment & Plan Note (Signed)
 She is looking to try and decrease pill burden. Dietary changes and weight loss were discussed. She is advised to avoid citrus and spicy foods, and to lose weight for GERD management. Omeprazole  40mg  is recommended every other day and assess tolerance.

## 2024-02-20 NOTE — Assessment & Plan Note (Signed)
 She is switching from metoprolol  to losartan  for its antihypertensive and renal protective effects. Metoprolol  will be tapered to one tablet daily for a week, then discontinued. Losartan  12.5mg  will be started for blood pressure management. Check blood pressure at home.

## 2024-02-20 NOTE — Progress Notes (Signed)
 [facility] [address] [place] [address] Dept: [phone] Dept Fax: [phone]  Virtual Video Visit  I connected with Butch Cashing on 02/20/24 at  8:20 AM EDT by a video enabled telemedicine application and verified that I am speaking with the correct person using two identifiers.  Location patient: Home Location provider: Clinic Persons participating in the virtual visit: Patient; Rheba Cedar, NP; Rockford Churches, CMA  I discussed the limitations of evaluation and management by telemedicine and the availability of in person appointments. The patient expressed understanding and agreed to proceed.  Chief Complaint  Patient presents with   Medication Management    Wants to discuss medication and recent lab results    SUBJECTIVE:  HPI:   Discussed the use of AI scribe software for clinical note transcription with the patient, who gave verbal consent to proceed.  History of Present Illness   [name] "Brian Campanile" is a 63 year old female with diabetes who presents for follow-up on lab results indicating proteinuria.  She is taking metoprolol  twice daily and is concerned about starting losartan  for kidney protection due to proteinuria. She questions the necessity of both medications and potential side effects. No current kidney issues are noted.  She experiences recurrent yeast infections and requests a prescription for Diflucan . She is familiar with the symptoms and recognizes when they occur. She is experiencing vaginal itching.   She has acid reflux and takes omeprazole  long-term. Discontinuing it for three days causes significant discomfort, with all foods causing issues. She avoids citrus and tomato products due to allergies and reflux.      Patient Active Problem List   Diagnosis Date Noted   Lower respiratory infection 11/21/2023   Yeast dermatitis 11/21/2023   Light headed 09/16/2023    Routine general medical examination at a health care facility 07/28/2023   Controlled type 2 diabetes mellitus without complication, without long-term current use of insulin (HCC) 02/28/2023   Nummular eczema 02/28/2023   Endometrial thickening on ultrasound 02/05/2023   Fibroids 01/22/2023   Screening mammogram for breast cancer 12/17/2022   Arthralgia of both hands 07/26/2022   Vitamin D  deficiency 07/25/2022   Pure hypercholesterolemia 04/24/2022   Cervical radiculopathy 02/28/2022   Hernia of abdominal wall 12/26/2021   Hematest positive stools 11/30/2021   Menopausal symptoms 04/23/2021   PMB (postmenopausal bleeding) 12/21/2020   Chronic low back pain 07/31/2017   Gastroesophageal reflux disease 07/31/2017   Seasonal allergies 07/31/2017   Hypertension 07/31/2017    Past Surgical History:  Procedure Laterality Date   ANKLE SURGERY Left    CHOLECYSTECTOMY     HYSTEROSCOPY WITH D & C N/A 03/27/2021   Procedure: DILATATION AND CURETTAGE /HYSTEROSCOPY;  Surgeon: Othelia Blinks, MD;  Location: [facility] OR;  Service: Gynecology;  Laterality: N/A;   TONSILLECTOMY     TUBAL LIGATION      Family History  Problem Relation Age of Onset   Cancer Mother        breast   Breast cancer Mother    Breast cancer Maternal Aunt    Diabetes Maternal Aunt    Hypertension Maternal Grandmother    Colon cancer Neg Hx    Rectal cancer Neg Hx    Esophageal cancer Neg Hx    Stomach cancer Neg Hx    Colon polyps Neg Hx     Social History   Tobacco Use   Smoking status: Never    Passive exposure: Never  Smokeless tobacco: Never  Vaping Use   Vaping status: Never Used  Substance Use Topics   Alcohol use: Yes    Comment: occasional wine   Drug use: No     Current Outpatient Medications:    azelastine  (ASTELIN ) 0.1 % nasal spray, Place 2 sprays into both nostrils 2 (two) times daily. Use in each nostril as directed, Disp: 30 mL, Rfl: 12   clotrimazole  (LOTRIMIN ) 1 % cream, Apply 1  Application topically 2 (two) times daily., Disp: 30 g, Rfl: 6   cromolyn  (OPTICROM ) 4 % ophthalmic solution, Place 1 drop into both eyes 4 (four) times daily., Disp: 10 mL, Rfl: 12   cyclobenzaprine  (FLEXERIL ) 10 MG tablet, Take 1 tablet (10 mg total) by mouth 3 (three) times daily as needed for muscle spasms., Disp: 30 tablet, Rfl: 0   EPINEPHrine  0.3 mg/0.3 mL IJ SOAJ injection, Inject 0.3 mg into the muscle as needed for anaphylaxis., Disp: 1 each, Rfl: 1   fluconazole  (DIFLUCAN ) 150 MG tablet, Take 1 tablet today and then a second in tablet in 3 days if you are still having symptoms, Disp: 2 tablet, Rfl: 0   ibuprofen  (ADVIL ) 800 MG tablet, Take 1 tablet (800 mg total) by mouth every 8 (eight) hours as needed., Disp: 30 tablet, Rfl: 0   ipratropium (ATROVENT ) 0.03 % nasal spray, Place 2 sprays into both nostrils every 12 (twelve) hours., Disp: 30 mL, Rfl: 12   levocetirizine (XYZAL ) 5 MG tablet, Take 1 tablet (5 mg total) by mouth every evening., Disp: 90 tablet, Rfl: 3   MELATONIN PO, Take 1 tablet by mouth at bedtime as needed (sleep)., Disp: , Rfl:    metoprolol  tartrate (LOPRESSOR ) 25 MG tablet, Take 1 tablet (25 mg total) by mouth 2 (two) times daily., Disp: 180 tablet, Rfl: 1   MIRALAX  17 GM/SCOOP powder, Take 1 Container by mouth daily., Disp: , Rfl:    montelukast  (SINGULAIR ) 10 MG tablet, Take 1 tablet (10 mg total) by mouth at bedtime., Disp: 90 tablet, Rfl: 1   omeprazole  (PRILOSEC) 40 MG capsule, Take 1 capsule (40 mg total) by mouth daily., Disp: 90 capsule, Rfl: 1   rosuvastatin  (CRESTOR ) 20 MG tablet, Take 1 tablet (20 mg total) by mouth daily., Disp: 90 tablet, Rfl: 1   tacrolimus  (PROTOPIC ) 0.1 % ointment, Apply topically 2 (two) times daily., Disp: 100 g, Rfl: 0   triamcinolone  ointment (KENALOG ) 0.1 %, Apply 1 Application topically 2 (two) times daily., Disp: 30 g, Rfl: 0   losartan  (COZAAR ) 25 MG tablet, Take 0.5 tablets (12.5 mg total) by mouth daily., Disp: 15 tablet, Rfl:  1  Allergies  Allergen Reactions   Cefdinir Swelling   Penicillins Anaphylaxis and Rash   Tomato Itching   Cefdinir [Cefdinir] Swelling   Amoxicillin Rash   Latex Rash   Nickel Hives and Rash   Vicodin [Hydrocodone-Acetaminophen ] Rash    ROS: See pertinent positives and negatives per HPI.  OBSERVATIONS/OBJECTIVE:  VITALS per patient if applicable: Today's Vitals   02/20/24 0806  Weight: 234 lb (106.1 kg)  Height: 5\' 7"  (1.702 m)   Body mass index is 36.65 kg/m.    GENERAL: Alert and oriented. Appears well and in no acute distress.  HEENT: Atraumatic. Conjunctiva clear. No obvious abnormalities on inspection of external nose and ears.  NECK: Normal movements of the head and neck.  LUNGS: On inspection, no signs of respiratory distress. Breathing rate appears normal. No obvious gross SOB, gasping or wheezing, and no conversational  dyspnea.  CV: No obvious cyanosis.  MS: Moves all visible extremities without noticeable abnormality.  PSYCH/NEURO: Pleasant and cooperative. No obvious depression or anxiety. Speech and thought processing grossly intact.  ASSESSMENT AND PLAN:  Problem List Items Addressed This Visit       Cardiovascular and Mediastinum   Hypertension - Primary   She is switching from metoprolol  to losartan  for its antihypertensive and renal protective effects. Metoprolol  will be tapered to one tablet daily for a week, then discontinued. Losartan  12.5mg  will be started for blood pressure management. Check blood pressure at home.       Relevant Medications   losartan  (COZAAR ) 25 MG tablet     Digestive   Gastroesophageal reflux disease   She is looking to try and decrease pill burden. Dietary changes and weight loss were discussed. She is advised to avoid citrus and spicy foods, and to lose weight for GERD management. Omeprazole  40mg  is recommended every other day and assess tolerance.         Endocrine   Controlled type 2 diabetes mellitus without  complication, without long-term current use of insulin (HCC)   Chronic, stable. Urine microalbumin was elevated last visit. Recommend Losartan  for renal protection and blood pressure control. She should take 12.5 mg of losartan , half a tablet daily. Metoprolol  will be reduced to one tablet daily for a week, then discontinued. She was educated on losartan 's role in kidney protection. Continue limiting carbs to less than 200g per day. Follow-up in 3 weeks at next scheduled appointment.       Relevant Medications   losartan  (COZAAR ) 25 MG tablet   Other Visit Diagnoses       Vulvovaginal candidiasis       Prescribe Diflucan  (fluconazole ), one tablet now and another in three days if needed.   Relevant Medications   fluconazole  (DIFLUCAN ) 150 MG tablet        I discussed the assessment and treatment plan with the patient. The patient was provided an opportunity to ask questions and all were answered. The patient agreed with the plan and demonstrated an understanding of the instructions.   The patient was advised to call back or seek an in-person evaluation if the symptoms worsen or if the condition fails to improve as anticipated.   Felicia Benjamin, NP

## 2024-02-20 NOTE — Patient Instructions (Signed)
 It was great to see you!  Decrease metoprolol  to 1 tablet daily for 7 days, then stop  Start losartan  1/2 tablet daily  Start checking your blood pressure daily   Let's follow-up in 3 weeks, sooner if you have concerns.  If a referral was placed today, you will be contacted for an appointment. Please note that routine referrals can sometimes take up to 3-4 weeks to process. Please call our office if you haven't heard anything after this time frame.  Take care,  Rheba Cedar, NP

## 2024-02-27 ENCOUNTER — Other Ambulatory Visit: Payer: Self-pay | Admitting: Nurse Practitioner

## 2024-02-27 DIAGNOSIS — Z1231 Encounter for screening mammogram for malignant neoplasm of breast: Secondary | ICD-10-CM

## 2024-03-05 ENCOUNTER — Ambulatory Visit
Admission: RE | Admit: 2024-03-05 | Discharge: 2024-03-05 | Disposition: A | Discharge status: Home / Self Care | Source: Ambulatory Visit | Attending: Nurse Practitioner | Admitting: Nurse Practitioner

## 2024-03-05 DIAGNOSIS — Z1231 Encounter for screening mammogram for malignant neoplasm of breast: Secondary | ICD-10-CM

## 2024-03-10 ENCOUNTER — Ambulatory Visit: Payer: Self-pay | Admitting: Nurse Practitioner

## 2024-03-11 ENCOUNTER — Ambulatory Visit: Payer: Self-pay | Admitting: Nurse Practitioner

## 2024-03-11 ENCOUNTER — Ambulatory Visit: Admitting: Nurse Practitioner

## 2024-03-11 VITALS — BP 126/82 | HR 89 | Temp 97.1°F | Ht 67.0 in | Wt 235.0 lb

## 2024-03-11 DIAGNOSIS — I10 Essential (primary) hypertension: Secondary | ICD-10-CM

## 2024-03-11 DIAGNOSIS — M5441 Lumbago with sciatica, right side: Secondary | ICD-10-CM

## 2024-03-11 LAB — BASIC METABOLIC PANEL WITH GFR
BUN: 16 mg/dL (ref 6–23)
CO2: 29 meq/L (ref 19–32)
Calcium: 9.7 mg/dL (ref 8.4–10.5)
Chloride: 104 meq/L (ref 96–112)
Creatinine, Ser: 0.87 mg/dL (ref 0.40–1.20)
GFR: 71.28 mL/min (ref >60.00)
Glucose, Bld: 103 mg/dL — ABNORMAL HIGH (ref 70–99)
Potassium: 4 meq/L (ref 3.5–5.1)
Sodium: 140 meq/L (ref 135–145)

## 2024-03-11 MED ORDER — LOSARTAN POTASSIUM 25 MG PO TABS: 12.5000 mg | ORAL_TABLET | Freq: Every day | ORAL | 1 refills | Status: AC

## 2024-03-11 MED ORDER — KETOROLAC TROMETHAMINE 60 MG/2ML IM SOLN
30.0000 mg | Freq: Once | INTRAMUSCULAR | Status: AC
  Administered 2024-03-11: 30 mg via INTRAMUSCULAR

## 2024-03-11 MED ORDER — ROSUVASTATIN CALCIUM 20 MG PO TABS: 20.0000 mg | ORAL_TABLET | Freq: Every day | ORAL | 1 refills | Status: DC

## 2024-03-11 NOTE — Progress Notes (Signed)
 Established Patient Office Visit  Subjective   Patient ID: Felicia Chase, female    DOB: 05-27-1961  Age: 63 y.o. MRN: [id number]  Chief Complaint  Patient presents with   Back Pain    Lower back pain for 3 days-starts on lower right side and radiates down buttocks    HPI Discussed the use of AI scribe software for clinical note transcription with the patient, who gave verbal consent to proceed.  History of Present Illness   Felicia Chase is a 63 year old female with chronic back pain who presents with acute right-sided back and leg pain.  She experiences pain on the entire right side of her back and leg for the past three days. The pain originates in her back and radiates down her leg, resembling a pulled hamstring, and is described as a pulling sensation. It worsens with standing or changing positions, making it difficult to bear weight on the affected side and hindering her ability to get out of bed. She uses a walker for mobility.  She moved a lot of totes last week in preparation for a yard sale, which may have contributed to her symptoms. She uses heat pads, ice, and topical cream without relief. She has not taken Tylenol  or ibuprofen  but uses muscle relaxers for the past three days.  She has sciatica, usually affecting the left side, but this episode is on the right side. Her typical back pain is present daily, but this episode feels different due to the pulling sensation. No incontinence or fever.  She switched from metoprolol  to losartan  12.5mg  daily. She denies side effects to the medication, chest pain, and shortness of breath.       ROS See pertinent positives and negatives per HPI.    Objective:     BP 126/82 (BP Location: Left Arm, Patient Position: Sitting, Cuff Size: Normal)   Pulse 89   Temp (!) 97.1 F (36.2 C)   Ht 5' 7 (1.702 m)   Wt 235 lb (106.6 kg)   SpO2 99%   BMI 36.81 kg/m  BP Readings from Last 3 Encounters:  03/11/24  126/82  02/05/24 128/82  12/11/23 (!) 144/90   Wt Readings from Last 3 Encounters:  03/11/24 235 lb (106.6 kg)  02/20/24 234 lb (106.1 kg)  02/05/24 234 lb 9.6 oz (106.4 kg)      Physical Exam Vitals and nursing note reviewed.  Constitutional:      General: She is not in acute distress.    Appearance: Normal appearance.  HENT:     Head: Normocephalic.   Eyes:     Conjunctiva/sclera: Conjunctivae normal.    Cardiovascular:     Rate and Rhythm: Normal rate and regular rhythm.     Pulses: Normal pulses.     Heart sounds: Normal heart sounds.  Pulmonary:     Effort: Pulmonary effort is normal.     Breath sounds: Normal breath sounds.   Musculoskeletal:        General: Tenderness (right paraspinal muscles, right hamstring, right buttocks) present. No swelling.     Cervical back: Normal range of motion.     Comments: Lumbar ROM limited due to pain. Straight leg raise positive on the right.    Skin:    General: Skin is warm.   Neurological:     General: No focal deficit present.     Mental Status: She is alert and oriented to person, place, and time.   Psychiatric:  Mood and Affect: Mood normal.        Behavior: Behavior normal.        Thought Content: Thought content normal.        Judgment: Judgment normal.      Assessment & Plan:   Problem List Items Addressed This Visit       Cardiovascular and Mediastinum   Hypertension - Primary   Chronic, stable. Her blood pressure is well-controlled on losartan  12.5mg  daily, and she discontinued metoprolol  without issues. Refill losartan  prescription. Check BMP today.       Relevant Medications   losartan  (COZAAR ) 25 MG tablet   rosuvastatin  (CRESTOR ) 20 MG tablet   Other Relevant Orders   Basic metabolic panel with GFR (Completed)     Nervous and Auditory   Acute right-sided low back pain with right-sided sciatica   She experiences acute right-sided low back pain with sciatica, likely from muscle strain and  nerve involvement. Previous episodes involved left-sided sciatica, and current treatments have been ineffective. Administer a Toradol  30mg  IM injection for pain relief. Start ibuprofen  800 mg three times daily with food, beginning at dinner. Continue heat and ice application, flexeril  10mg  TID prn, and topical arthritis cream as needed for comfort. Provide a handicap sticker replacement.      Return in about 6 months (around 09/10/2024) for CPE.    Felicia DELENA Harada, NP

## 2024-03-11 NOTE — Patient Instructions (Signed)
 It was great to see you!  We are checking your labs today and will let you know the results via mychart/phone.   We are giving you a toradol  injection today for your pain   Start ibuprofen  3 times a day with food for your pain - start this at dinner tonight   Let's follow-up in 6 months, sooner if you have concerns.  If a referral was placed today, you will be contacted for an appointment. Please note that routine referrals can sometimes take up to 3-4 weeks to process. Please call our office if you haven't heard anything after this time frame.  Take care,  Tinnie Harada, NP

## 2024-03-11 NOTE — Assessment & Plan Note (Signed)
 Chronic, stable. Her blood pressure is well-controlled on losartan  12.5mg  daily, and she discontinued metoprolol  without issues. Refill losartan  prescription. Check BMP today.

## 2024-03-11 NOTE — Assessment & Plan Note (Signed)
 She experiences acute right-sided low back pain with sciatica, likely from muscle strain and nerve involvement. Previous episodes involved left-sided sciatica, and current treatments have been ineffective. Administer a Toradol  30mg  IM injection for pain relief. Start ibuprofen  800 mg three times daily with food, beginning at dinner. Continue heat and ice application, flexeril  10mg  TID prn, and topical arthritis cream as needed for comfort. Provide a handicap sticker replacement.

## 2024-03-12 MED ORDER — PREDNISONE 20 MG PO TABS: 40.0000 mg | ORAL_TABLET | Freq: Every day | ORAL | 0 refills | Status: AC

## 2024-03-17 ENCOUNTER — Telehealth: Payer: Self-pay

## 2024-03-17 DIAGNOSIS — M5441 Lumbago with sciatica, right side: Secondary | ICD-10-CM

## 2024-03-17 NOTE — Telephone Encounter (Signed)
 Copied from CRM [id number]. Topic: General - Other >> Mar 17, 2024  7:51 AM Suzen RAMAN wrote: Reason for CRM: Patient would like a phone call back pertaining to a pulled muscle.. shot and medicine was helping but now it is not [phone].

## 2024-03-17 NOTE — Addendum Note (Signed)
 Addended by: Jennika Ringgold A on: 03/17/2024 01:57 PM   Modules accepted: Orders

## 2024-03-17 NOTE — Telephone Encounter (Signed)
Please review and advise. Thanks. Dm/cma  

## 2024-03-18 ENCOUNTER — Other Ambulatory Visit: Payer: Self-pay | Admitting: Nurse Practitioner

## 2024-03-18 DIAGNOSIS — K219 Gastro-esophageal reflux disease without esophagitis: Secondary | ICD-10-CM

## 2024-08-11 ENCOUNTER — Other Ambulatory Visit: Payer: Self-pay | Admitting: Nurse Practitioner

## 2024-08-11 NOTE — Telephone Encounter (Signed)
 Refill request for  Cyclobenzaprine  10 mg LR  08/07/23, #30, 0 rf LOV  03/11/24 FOV   none scheduled.  Please review and advise.  Thanks. Dm/cma

## 2024-08-30 ENCOUNTER — Telehealth: Payer: Self-pay | Admitting: Gastroenterology

## 2024-08-30 NOTE — Telephone Encounter (Signed)
 Inbound call from patient stating they feel like they have a hemorrhoid and patient is afraid to go to bathroom. Patient would like to speak to nurse and see if theres a sooner appointment than 10/06/24.  Requesting a call back  Please advise  Thank you

## 2024-08-31 NOTE — Telephone Encounter (Signed)
 Left message on machine to call back   The pt has not been seen since 2023 for procedure and 2019 for office visit. Pt will need appt and can also call PCP in the meantime

## 2024-09-01 ENCOUNTER — Encounter: Payer: Self-pay | Admitting: Gastroenterology

## 2024-09-01 ENCOUNTER — Ambulatory Visit: Admitting: Gastroenterology

## 2024-09-01 VITALS — BP 136/80 | HR 87 | Ht 67.0 in | Wt 227.0 lb

## 2024-09-01 DIAGNOSIS — K5900 Constipation, unspecified: Secondary | ICD-10-CM

## 2024-09-01 DIAGNOSIS — R143 Flatulence: Secondary | ICD-10-CM | POA: Diagnosis not present

## 2024-09-01 DIAGNOSIS — K648 Other hemorrhoids: Secondary | ICD-10-CM

## 2024-09-01 DIAGNOSIS — R140 Abdominal distension (gaseous): Secondary | ICD-10-CM | POA: Diagnosis not present

## 2024-09-01 DIAGNOSIS — K5909 Other constipation: Secondary | ICD-10-CM

## 2024-09-01 MED ORDER — HYDROCORTISONE ACETATE 25 MG RE SUPP: 25.0000 mg | Freq: Two times a day (BID) | RECTAL | 1 refills | Status: AC

## 2024-09-01 NOTE — Patient Instructions (Addendum)
 We have sent the following medications to your pharmacy for you to pick up at your convenience: Anusol  Suppositories - Use nightly for 10 days.   You have been scheduled for Hemorrhoid Banding Appointment with Dr Legrand on 09/29/24 at 10:00 am   We have given you samples of the following medication to take: Ibgard - as directed.    Please purchase the following medications over the counter and take as directed:  A high fiber diet with plenty of fluids (up to 8 glasses of water daily) is suggested to relieve these symptoms.  Metamucil, 1 tablespoon once or twice daily can be used to keep bowels regular if needed.   Low FODMAP Diet: (Fermentable Oligosaccharides, Disaccharides, Monosaccharides, and Polyols) These are short chain carbohydrates and sugar alcohols that are poorly absorbed by the body, resulting in multiple abdominal symptoms, including changes in bowel habits, abdominal pain/discomfort, bloating, abdominal distension, gas, etc.     Thank you for choosing me and [facility].  Camie Furbish, PA-C

## 2024-09-01 NOTE — Telephone Encounter (Signed)
 The pt was actually given an appt for today with Camie.  She is here and nothing further needed

## 2024-09-01 NOTE — Progress Notes (Signed)
 Felicia Chase [id number] 1961/07/19   Chief Complaint: Hemorrhoids  Referring Provider: Nedra Tinnie LABOR, NP Primary GI MD: Dr. Legrand   HPI: Felicia Chase is a 63 y.o. female with past medical history of GERD, hiatal hernia, HTN, cholecystectomy who presents today for a complaint of hemorrhoids.    Last seen in office 01/12/2018 by Vina Dasen, NP for rectal bleeding.  Endorsed several days of rectal bleeding associated with constipation earlier that month.  Evaluated in the ED, had a slight drop in hemoglobin, KUB showed moderate stool throughout the colon, advised to take MiraLAX .  Patient noted to have a history of colon polyps with 2 prior colonoscopies at that time.  She was due for repeat colonoscopy and that was scheduled, with consideration for hemorrhoid banding if appropriate and if she did not respond to steroid cream.  Colonoscopy 01/2018 unremarkable aside from diverticulosis and thought to have benign anorectal bleeding from constipation.  Had another colonoscopy 02/2022 for heme positive stool (FOBT stool card at gynecology).  She had 1 diminutive polyp, diverticulosis, internal hemorrhoids, and otherwise normal.  Polyp was tubular adenoma, advised to have recall in 7 years.  Presents today for evaluation of hemorrhoids.   Discussed the use of AI scribe software for clinical note transcription with the patient, who gave verbal consent to proceed.  History of Present Illness Felicia Chase is a 63 year old female with hemorrhoids who presents with worsening symptoms.  Hemorrhoidal prolapse and symptoms - Worsening hemorrhoid symptoms since Friday - Prolapse occurs with bowel movements and while sitting - Manual reduction required, but prolapse recurs - No current rectal bleeding, but has occurred in the past - Fear of using the bathroom due to symptoms (irritation but no significant pain) - Uses Preparation H for symptom relief  - Previously  discussed hemorrhoid banding as a treatment option  Altered bowel habits - History of constipation - Stool consistency varies between loose and hard, sometimes resembling 'cow manure' - Bowel movements have a foul odor - Social embarrassment due to stool odor and gas - Diagnosed with slow colonic transit and irritable bowel syndrome - Regular use of Miralax  - Tried Metamucil in the past, but not recently - No recent use of fiber supplements  Abdominal gas and bloating - Significant gas and bloating causing distress - Mother has commented on the odor, encouraging medical consultation   Previous GI Procedures/Imaging   Colonoscopy 03/06/2022 - Small lipoma in the ascending colon.  - One diminutive polyp at the hepatic flexure, removed with a cold snare. Resected and retrieved.  - Diverticulosis in the left colon.  - Internal hemorrhoids.  - The examination was otherwise normal on direct and retroflexion views. - Recall 7 years  Apparent false positive FOBT.  Path: Surgical [P], colon, transverse, polyp (1) FINDINGS CONSISTENT WITH TUBULAR ADENOMA. NO HIGH-GRADE DYSPLASIA OR MALIGNANCY IS SEEN. CLINICAL AND ENDOSCOPIC CORRELATION IS REQUIRED.  Colonoscopy 01/26/2018 - Diverticulosis in the sigmoid colon.  - The examination was otherwise normal on direct and retroflexion views.  - No specimens collected.  Benign ano-rectal bleeding from constipation.   EGD by Dr. Charmaine done 01/24/2012.  Exam done for evaluation of chronic heartburn.  Esophagus was normal.  There was a 1 to 2 cm hiatal hernia.  A 4 mm polyp was noted at the incisura.  This was biopsied.  Duodenal normal.  Gastric nodule biopsy showed nonspecific chronic inflammation.  H. pylori negative.  Esophageal biopsy unremarkable.   Colonoscopy same  day.  8 cm of the terminal ileum were intubated and normal.  The preparation was good.  Normal colon.   Past Medical History:  Diagnosis Date   Allergy     Arthritis     back and ankle   Borderline diabetes 12/23/2017   GERD (gastroesophageal reflux disease)    H/O seasonal allergies    Hiatal hernia    Hypertension     Past Surgical History:  Procedure Laterality Date   ANKLE SURGERY Left    CHOLECYSTECTOMY     HYSTEROSCOPY WITH D & C N/A 03/27/2021   Procedure: DILATATION AND CURETTAGE /HYSTEROSCOPY;  Surgeon: Lorence Ozell CROME, MD;  Location: [facility] OR;  Service: Gynecology;  Laterality: N/A;   TONSILLECTOMY     TUBAL LIGATION      Current Outpatient Medications  Medication Sig Dispense Refill   cyclobenzaprine  (FLEXERIL ) 10 MG tablet TAKE 1 TABLET (10 MG TOTAL) BY MOUTH 3 (THREE) TIMES DAILY AS NEEDED FOR MUSCLE SPASMS. 30 tablet 0   fluconazole  (DIFLUCAN ) 150 MG tablet Take 1 tablet today and then a second in tablet in 3 days if you are still having symptoms 2 tablet 0   ibuprofen  (ADVIL ) 800 MG tablet Take 1 tablet (800 mg total) by mouth every 8 (eight) hours as needed. 30 tablet 0   ipratropium (ATROVENT ) 0.03 % nasal spray Place 2 sprays into both nostrils every 12 (twelve) hours. 30 mL 12   losartan  (COZAAR ) 25 MG tablet Take 0.5 tablets (12.5 mg total) by mouth daily. 45 tablet 1   MELATONIN PO Take 1 tablet by mouth at bedtime as needed (sleep).     montelukast  (SINGULAIR ) 10 MG tablet Take 1 tablet (10 mg total) by mouth at bedtime. 90 tablet 1   omeprazole  (PRILOSEC) 40 MG capsule TAKE 1 CAPSULE (40 MG TOTAL) BY MOUTH DAILY. 90 capsule 1   rosuvastatin  (CRESTOR ) 20 MG tablet Take 1 tablet (20 mg total) by mouth daily. 90 tablet 1   triamcinolone  ointment (KENALOG ) 0.1 % Apply 1 Application topically 2 (two) times daily. 30 g 0   azelastine  (ASTELIN ) 0.1 % nasal spray Place 2 sprays into both nostrils 2 (two) times daily. Use in each nostril as directed (Patient not taking: Reported on 09/01/2024) 30 mL 12   clotrimazole  (LOTRIMIN ) 1 % cream Apply 1 Application topically 2 (two) times daily. (Patient not taking: Reported on 09/01/2024) 30 g 6    cromolyn  (OPTICROM ) 4 % ophthalmic solution Place 1 drop into both eyes 4 (four) times daily. (Patient not taking: Reported on 09/01/2024) 10 mL 12   EPINEPHrine  0.3 mg/0.3 mL IJ SOAJ injection Inject 0.3 mg into the muscle as needed for anaphylaxis. (Patient not taking: Reported on 09/01/2024) 1 each 1   levocetirizine (XYZAL ) 5 MG tablet Take 1 tablet (5 mg total) by mouth every evening. (Patient not taking: Reported on 09/01/2024) 90 tablet 3   MIRALAX  17 GM/SCOOP powder Take 1 Container by mouth daily. (Patient not taking: Reported on 09/01/2024)     predniSONE  (DELTASONE ) 20 MG tablet Take 2 tablets (40 mg total) by mouth daily with breakfast. (Patient not taking: Reported on 09/01/2024) 10 tablet 0   tacrolimus  (PROTOPIC ) 0.1 % ointment Apply topically 2 (two) times daily. (Patient not taking: Reported on 09/01/2024) 100 g 0   No current facility-administered medications for this visit.    Allergies as of 09/01/2024 - Review Complete 09/01/2024  Allergen Reaction Noted   Cefdinir Swelling 10/29/2011   Penicillins Anaphylaxis and Rash 10/29/2011  Tomato Itching 04/30/2023   Cefdinir [cefdinir] Swelling 10/29/2011   Amoxicillin Rash 03/10/2014   Latex Rash 03/10/2014   Nickel Hives and Rash 03/10/2014   Vicodin [hydrocodone-acetaminophen ] Rash 10/29/2011    Family History  Problem Relation Age of Onset   Cancer Mother        breast   Breast cancer Mother    Breast cancer Maternal Aunt    Diabetes Maternal Aunt    Hypertension Maternal Grandmother    Colon cancer Neg Hx    Rectal cancer Neg Hx    Esophageal cancer Neg Hx    Stomach cancer Neg Hx    Colon polyps Neg Hx     Social History[1]   Review of Systems:    Constitutional: No unintentional weight loss, fever, chills Cardiovascular: No chest pain Respiratory: No SOB Gastrointestinal: See HPI and otherwise negative   Physical Exam:  Vital signs: BP 136/80   Pulse 87   Ht 5' 7 (1.702 m)   Wt 227 lb (103 kg)    BMI 35.55 kg/m   Wt Readings from Last 3 Encounters:  09/01/24 227 lb (103 kg)  03/11/24 235 lb (106.6 kg)  02/20/24 234 lb (106.1 kg)     Constitutional: Pleasant, obese female in NAD, alert and cooperative Head:  Normocephalic and atraumatic.  Respiratory: Respirations even and unlabored. Lungs clear to auscultation bilaterally.  No wheezes, crackles, or rhonchi.  Cardiovascular:  Regular rate and rhythm. No murmurs. No peripheral edema. Gastrointestinal:  Soft, nondistended, nontender. No rebound or guarding. Normal bowel sounds. No appreciable masses or hepatomegaly. Rectal: Nonbleeding, nonthrombosed external hemorrhoid.  Prolapsed internal hemorrhoid which is reducible.  Anoscopy limited by patient body habitus and large hemorrhoids and ultimately was not able to be performed today.  No visible bleeding, no fissures seen.  Chaperone present for exam. Neurologic:  Alert and oriented x4;  grossly normal neurologically.  Skin:   Dry and intact without significant lesions or rashes. Psychiatric: Oriented to person, place and time. Demonstrates good judgement and reason without abnormal affect or behaviors.   RELEVANT LABS AND IMAGING: CBC    Component Value Date/Time   WBC 7.9 02/05/2024 1623   RBC 4.28 02/05/2024 1623   HGB 11.5 (L) 02/05/2024 1623   HCT 35.0 (L) 02/05/2024 1623   PLT 192.0 02/05/2024 1623   MCV 81.6 02/05/2024 1623   MCH 27.1 03/15/2021 1011   MCHC 32.8 02/05/2024 1623   RDW 14.8 02/05/2024 1623   LYMPHSABS 2.5 02/05/2024 1623   MONOABS 0.6 02/05/2024 1623   EOSABS 0.1 02/05/2024 1623   BASOSABS 0.1 02/05/2024 1623    CMP     Component Value Date/Time   NA 140 03/11/2024 1103   K 4.0 03/11/2024 1103   CL 104 03/11/2024 1103   CO2 29 03/11/2024 1103   GLUCOSE 103 (H) 03/11/2024 1103   BUN 16 03/11/2024 1103   CREATININE 0.87 03/11/2024 1103   CALCIUM  9.7 03/11/2024 1103   PROT 7.3 02/05/2024 1623   ALBUMIN 4.6 02/05/2024 1623   AST 16  02/05/2024 1623   ALT 14 02/05/2024 1623   ALKPHOS 68 02/05/2024 1623   BILITOT 0.3 02/05/2024 1623   GFRNONAA >60 03/15/2021 1011   GFRAA >60 03/28/2020 1834     Assessment/Plan:   Assessment & Plan Prolapsing internal hemorrhoids Chronic prolapsing internal hemorrhoids with recent exacerbation.  Patient interested in hemorrhoid banding.  On exam does have a prolapsed internal hemorrhoid which is reducible.  Exam was limited by patient body  habitus and large hemorrhoids, unable to perform anoscopy today, however we will send Anusol  suppositories which she has not yet tried and hopefully reduce some swelling.  Will go ahead and schedule her for banding visit and hopefully exam will be able to be performed at that time.  - Schedule hemorrhoid banding procedure. - Prescribed steroid suppositories  Chronic constipation Alternating constipation with loose stools. History of slow-moving colon and IBS. Current symptoms include cramping and bloating.  - Recommend initiation of fiber supplement such as Benefiber or Metamucil to help regulate bowel movements and bulk stools  Excessive intestinal gas Chronic excessive gas with bloating and malodorous flatulence. Persistent and socially distressing.  - Advised trial of dairy elimination - Low FODMAP diet - Ibgard samples - At follow up consider empiric treatment for possible SIBO with Xifaxan vs breath test    Camie Furbish, PA-C [facility] 09/01/2024, 9:31 AM  Patient Care Team: Nedra Tinnie LABOR, NP as PCP - General (Internal Medicine)       [1]  Social History Tobacco Use   Smoking status: Never    Passive exposure: Never   Smokeless tobacco: Never  Vaping Use   Vaping status: Never Used  Substance Use Topics   Alcohol use: Yes    Comment: occasional wine   Drug use: No

## 2024-09-02 NOTE — Progress Notes (Signed)
 ____________________________________________________________  Attending physician addendum:  Thank you for sending this case to me. I have reviewed the entire note and agree with the plan.   Victory Brand, MD  ____________________________________________________________

## 2024-09-07 ENCOUNTER — Other Ambulatory Visit: Payer: Self-pay

## 2024-09-07 ENCOUNTER — Other Ambulatory Visit (HOSPITAL_COMMUNITY)
Admission: RE | Admit: 2024-09-07 | Discharge: 2024-09-07 | Disposition: A | Discharge status: Home / Self Care | Source: Ambulatory Visit | Attending: Family Medicine | Admitting: Family Medicine

## 2024-09-07 ENCOUNTER — Ambulatory Visit: Payer: Self-pay

## 2024-09-07 VITALS — BP 162/85 | HR 84 | Ht 67.0 in | Wt 225.0 lb

## 2024-09-07 DIAGNOSIS — N898 Other specified noninflammatory disorders of vagina: Secondary | ICD-10-CM | POA: Diagnosis present

## 2024-09-07 LAB — CERVICOVAGINAL ANCILLARY ONLY
Bacterial Vaginitis (gardnerella): NEGATIVE
Candida Glabrata: NEGATIVE
Candida Vaginitis: NEGATIVE
Comment: NEGATIVE
Comment: NEGATIVE
Comment: NEGATIVE

## 2024-09-07 NOTE — Progress Notes (Signed)
 Felicia Chase is here with concern of tan vaginal discharge with some odor. These symptoms have been present for the past 2-3 weeks.  Self swab instructions given and specimen obtained. Declines to do STD testing on swab. Explained patient will be contacted with any abnormal results. Patient is due for annual exam; offered for patient to schedule during checkout.    Rosaline Pendleton, RN 09/07/2024  8:52 AM

## 2024-09-21 ENCOUNTER — Other Ambulatory Visit: Payer: Self-pay | Admitting: Nurse Practitioner

## 2024-09-21 DIAGNOSIS — K219 Gastro-esophageal reflux disease without esophagitis: Secondary | ICD-10-CM

## 2024-09-21 DIAGNOSIS — E7800 Pure hypercholesterolemia, unspecified: Secondary | ICD-10-CM

## 2024-09-22 NOTE — Telephone Encounter (Signed)
 Requesting: ROSUVASTATIN  20 MG TAB* , OMEPRAZOLE  40 MG CAP*  Last Visit: 03/11/2024 Next Visit: Visit date not found Last Refill: 03/11/2024, 03/18/2024  Please Advise   Patient was due for a CPE on 09/10/24

## 2024-09-27 ENCOUNTER — Telehealth: Payer: Self-pay | Admitting: Gastroenterology

## 2024-09-27 NOTE — Telephone Encounter (Signed)
 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

## 2024-09-28 NOTE — Telephone Encounter (Signed)
 Called the patient. No answer. Left voicemail encouraging her to keep the appointment. She can discuss control of the constipation if the doctor does not feel banding is beneficial.

## 2024-09-29 ENCOUNTER — Encounter: Payer: Self-pay | Admitting: Gastroenterology

## 2024-09-29 ENCOUNTER — Ambulatory Visit: Admitting: Gastroenterology

## 2024-09-29 VITALS — BP 150/88 | HR 80 | Ht 67.0 in | Wt 227.1 lb

## 2024-09-29 DIAGNOSIS — K641 Second degree hemorrhoids: Secondary | ICD-10-CM | POA: Diagnosis not present

## 2024-09-29 DIAGNOSIS — K648 Other hemorrhoids: Secondary | ICD-10-CM

## 2024-09-29 NOTE — Patient Instructions (Signed)

## 2024-09-29 NOTE — Progress Notes (Signed)
 CMA present for entire visit  Patient with intermittent prolapsing bleeding of internal hemorrhoids.  PROCEDURE NOTE: The patient presents with symptomatic grade 2  hemorrhoids, requesting rubber band ligation of his/her hemorrhoidal disease.  All risks, benefits and alternative forms of therapy were described and informed consent was obtained.  DRE revealed: Normal external exam, no fissure on DRE, no tenderness or palpable internal lesion.  Anoscopy revealed swollen internal hemorrhoidal plexus, primarily right sided   The anorectum was pre-medicated with 0.125% NTG and lubricant. The decision was made to band the RP internal hemorrhoids, and the [facility] ORegan System was used to perform band ligation without complication.  Digital anorectal examination was then performed to assure proper positioning of the band, and to adjust the banded tissue as required.  The patient was discharged home without pain or other issues.  Dietary and behavioral recommendations were given and along with follow-up instructions.     The following adjunctive treatments were recommended:  MiraLAX  as needed for constipation  The patient will return 3 to 4 weeks for  follow-up and possible additional banding as required. No complications were encountered and the patient tolerated the procedure well.  [place] Victory Brand, MD

## 2024-10-29 ENCOUNTER — Encounter: Admitting: Gastroenterology
